# Patient Record
Sex: Female | Born: 1985 | Race: Asian | Hispanic: No | Smoking: Former smoker
Health system: Southern US, Community
[De-identification: ages and names within clinical notes are randomized; demographics above are authoritative.]

## PROBLEM LIST (undated history)

## (undated) DIAGNOSIS — R87629 Unspecified abnormal cytological findings in specimens from vagina: Secondary | ICD-10-CM

## (undated) DIAGNOSIS — B999 Unspecified infectious disease: Secondary | ICD-10-CM

## (undated) DIAGNOSIS — O039 Complete or unspecified spontaneous abortion without complication: Secondary | ICD-10-CM

## (undated) DIAGNOSIS — D649 Anemia, unspecified: Secondary | ICD-10-CM

## (undated) DIAGNOSIS — Z87442 Personal history of urinary calculi: Secondary | ICD-10-CM

## (undated) HISTORY — PX: NO PAST SURGERIES: SHX2092

## (undated) HISTORY — DX: Anemia, unspecified: D64.9

## (undated) HISTORY — DX: Complete or unspecified spontaneous abortion without complication: O03.9

## (undated) HISTORY — PX: OTHER SURGICAL HISTORY: SHX169

---
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
## 2009-10-10 ENCOUNTER — Inpatient Hospital Stay: Payer: Self-pay | Admitting: Obstetrics & Gynecology

## 2011-08-05 ENCOUNTER — Emergency Department: Payer: Self-pay | Admitting: *Deleted

## 2011-08-08 ENCOUNTER — Emergency Department: Payer: Self-pay | Admitting: Emergency Medicine

## 2011-09-06 ENCOUNTER — Emergency Department: Payer: Self-pay | Admitting: Emergency Medicine

## 2012-03-05 ENCOUNTER — Emergency Department: Payer: Self-pay | Admitting: Emergency Medicine

## 2012-05-06 ENCOUNTER — Emergency Department: Payer: Self-pay | Admitting: Emergency Medicine

## 2012-05-06 LAB — URINALYSIS, COMPLETE
Bilirubin,UR: NEGATIVE
Ketone: NEGATIVE
Protein: 100
Specific Gravity: 1.018 (ref 1.003–1.030)
WBC UR: 31 /HPF (ref 0–5)

## 2012-05-06 LAB — CBC
MCHC: 32.6 g/dL (ref 32.0–36.0)
MCV: 65 fL — ABNORMAL LOW (ref 80–100)
RDW: 16.3 % — ABNORMAL HIGH (ref 11.5–14.5)
WBC: 12.9 10*3/uL — ABNORMAL HIGH (ref 3.6–11.0)

## 2012-05-06 LAB — HCG, QUANTITATIVE, PREGNANCY: Beta Hcg, Quant.: 252 m[IU]/mL — ABNORMAL HIGH

## 2012-05-08 ENCOUNTER — Other Ambulatory Visit: Payer: Self-pay | Admitting: Emergency Medicine

## 2012-05-08 LAB — HCG, QUANTITATIVE, PREGNANCY: Beta Hcg, Quant.: 26 m[IU]/mL — ABNORMAL HIGH

## 2012-11-30 ENCOUNTER — Encounter: Payer: Self-pay | Admitting: Obstetrics and Gynecology

## 2013-03-29 ENCOUNTER — Inpatient Hospital Stay: Payer: Self-pay

## 2013-03-29 LAB — CBC WITH DIFFERENTIAL/PLATELET
Basophil #: 0 10*3/uL (ref 0.0–0.1)
Basophil %: 0.2 %
EOS ABS: 0.2 10*3/uL (ref 0.0–0.7)
Eosinophil %: 1.7 %
HCT: 30.9 % — AB (ref 35.0–47.0)
HGB: 10.5 g/dL — ABNORMAL LOW (ref 12.0–16.0)
LYMPHS ABS: 2.1 10*3/uL (ref 1.0–3.6)
LYMPHS PCT: 19.2 %
MCH: 22.7 pg — ABNORMAL LOW (ref 26.0–34.0)
MCHC: 34 g/dL (ref 32.0–36.0)
MCV: 67 fL — AB (ref 80–100)
MONO ABS: 0.7 x10 3/mm (ref 0.2–0.9)
Monocyte %: 6.1 %
Neutrophil #: 8 10*3/uL — ABNORMAL HIGH (ref 1.4–6.5)
Neutrophil %: 72.8 %
Platelet: 158 10*3/uL (ref 150–440)
RBC: 4.62 10*6/uL (ref 3.80–5.20)
RDW: 15.5 % — ABNORMAL HIGH (ref 11.5–14.5)
WBC: 11 10*3/uL (ref 3.6–11.0)

## 2013-03-29 LAB — GC/CHLAMYDIA PROBE AMP

## 2013-03-30 LAB — HEMATOCRIT: HCT: 28 % — ABNORMAL LOW

## 2014-07-09 NOTE — H&P (Signed)
L&D Evaluation:  History:  HPI Pt is a 29 yo G4P2012 at 40.[redacted] weeks GA who presents to L&D with reports of SROM at 2:59am. She reports clear fluid, +FM, and is also having contractions. Her prenatal care is significant for late entry to care, +GC and CT during this pregnancy with a negative GC/CT on 12/29, amenia, smoker, and echogenic foci seen with a negative harmony test. She is B+, RI, VI, GBS negative, and recieved her TDaP this pregnancy.   Presents with contractions, leaking fluid   Patient's Medical History anemia   Patient's Surgical History none   Medications Pre Natal Vitamins   Allergies NKDA   Social History tobacco   Family History Non-Contributory   ROS:  ROS All systems were reviewed.  HEENT, CNS, GI, GU, Respiratory, CV, Renal and Musculoskeletal systems were found to be normal.   Exam:  Vital Signs stable   General no apparent distress   Mental Status clear   Chest clear   Heart normal sinus rhythm   Abdomen gravid, tender with contractions   Pelvic no external lesions, by RN at 5:35- 4/80/-1   Mebranes Ruptured   Description clear   FHT normal rate with no decels, 140's +accels   Ucx regular, 2-4 minutes   Skin dry, no lesions, no rashes   Lymph no lymphadenopathy   Impression:  Impression IUP at 40.4, SROM, labor   Plan:  Plan EFM/NST, monitor contractions and for cervical change, epidural as desired, anticipate SVD   Follow Up Appointment need to schedule   Electronic Signatures: Jannet MantisSubudhi, Kewana Sanon (CNM)  (Signed 29-Jan-15 07:21)  Authored: L&D Evaluation   Last Updated: 29-Jan-15 07:21 by Jannet MantisSubudhi, Damiel Barthold (CNM)

## 2015-05-27 ENCOUNTER — Emergency Department
Admission: EM | Admit: 2015-05-27 | Discharge: 2015-05-27 | Disposition: A | Discharge status: Home / Self Care | Payer: Self-pay | Attending: Student | Admitting: Student

## 2015-05-27 ENCOUNTER — Encounter: Payer: Self-pay | Admitting: Emergency Medicine

## 2015-05-27 ENCOUNTER — Emergency Department: Payer: Self-pay

## 2015-05-27 DIAGNOSIS — R112 Nausea with vomiting, unspecified: Secondary | ICD-10-CM | POA: Insufficient documentation

## 2015-05-27 DIAGNOSIS — Z3202 Encounter for pregnancy test, result negative: Secondary | ICD-10-CM | POA: Insufficient documentation

## 2015-05-27 DIAGNOSIS — N390 Urinary tract infection, site not specified: Secondary | ICD-10-CM | POA: Insufficient documentation

## 2015-05-27 DIAGNOSIS — R197 Diarrhea, unspecified: Secondary | ICD-10-CM | POA: Insufficient documentation

## 2015-05-27 DIAGNOSIS — R109 Unspecified abdominal pain: Secondary | ICD-10-CM

## 2015-05-27 LAB — COMPREHENSIVE METABOLIC PANEL
ALBUMIN: 3.6 g/dL (ref 3.5–5.0)
ALT: 20 U/L (ref 14–54)
AST: 20 U/L (ref 15–41)
Alkaline Phosphatase: 52 U/L (ref 38–126)
Anion gap: 5 (ref 5–15)
BUN: 10 mg/dL (ref 6–20)
CHLORIDE: 109 mmol/L (ref 101–111)
CO2: 23 mmol/L (ref 22–32)
Calcium: 8.4 mg/dL — ABNORMAL LOW (ref 8.9–10.3)
Creatinine, Ser: 0.51 mg/dL (ref 0.44–1.00)
GFR calc Af Amer: >60 mL/min (ref >60)
GFR calc non Af Amer: >60 mL/min (ref >60)
GLUCOSE: 97 mg/dL (ref 65–99)
POTASSIUM: 3.6 mmol/L (ref 3.5–5.1)
SODIUM: 137 mmol/L (ref 135–145)
TOTAL PROTEIN: 7.4 g/dL (ref 6.5–8.1)
Total Bilirubin: 0.3 mg/dL (ref 0.3–1.2)

## 2015-05-27 LAB — URINALYSIS COMPLETE WITH MICROSCOPIC (ARMC ONLY)
Bilirubin Urine: NEGATIVE
Glucose, UA: NEGATIVE mg/dL
Ketones, ur: NEGATIVE mg/dL
NITRITE: NEGATIVE
PROTEIN: 30 mg/dL — AB
SPECIFIC GRAVITY, URINE: 1.03 (ref 1.005–1.030)
pH: 5 (ref 5.0–8.0)

## 2015-05-27 LAB — LIPASE, BLOOD: LIPASE: 13 U/L (ref 11–51)

## 2015-05-27 LAB — CBC
HEMATOCRIT: 32.4 % — AB (ref 35.0–47.0)
HEMOGLOBIN: 10.7 g/dL — AB (ref 12.0–16.0)
MCH: 19.9 pg — ABNORMAL LOW (ref 26.0–34.0)
MCHC: 33 g/dL (ref 32.0–36.0)
MCV: 60.4 fL — AB (ref 80.0–100.0)
Platelets: 225 10*3/uL (ref 150–440)
RBC: 5.36 MIL/uL — ABNORMAL HIGH (ref 3.80–5.20)
RDW: 16 % — ABNORMAL HIGH (ref 11.5–14.5)
WBC: 11.2 10*3/uL — ABNORMAL HIGH (ref 3.6–11.0)

## 2015-05-27 LAB — POCT PREGNANCY, URINE: PREG TEST UR: NEGATIVE

## 2015-05-27 MED ORDER — FENTANYL CITRATE (PF) 100 MCG/2ML IJ SOLN
50.0000 ug | Freq: Once | INTRAMUSCULAR | Status: AC
  Administered 2015-05-27: 50 ug via INTRAVENOUS
  Filled 2015-05-27: qty 2

## 2015-05-27 MED ORDER — NITROFURANTOIN MONOHYD MACRO 100 MG PO CAPS: 100.0000 mg | ORAL_CAPSULE | Freq: Two times a day (BID) | ORAL | Status: DC

## 2015-05-27 MED ORDER — IBUPROFEN 400 MG PO TABS: 400.0000 mg | ORAL_TABLET | Freq: Four times a day (QID) | ORAL | Status: DC | PRN

## 2015-05-27 MED ORDER — DIATRIZOATE MEGLUMINE & SODIUM 66-10 % PO SOLN
15.0000 mL | Freq: Once | ORAL | Status: AC
  Administered 2015-05-27: 15 mL via ORAL

## 2015-05-27 MED ORDER — IOPAMIDOL (ISOVUE-300) INJECTION 61%
100.0000 mL | Freq: Once | INTRAVENOUS | Status: AC | PRN
  Administered 2015-05-27: 100 mL via INTRAVENOUS

## 2015-05-27 MED ORDER — NITROFURANTOIN MONOHYD MACRO 100 MG PO CAPS
100.0000 mg | ORAL_CAPSULE | Freq: Once | ORAL | Status: AC
  Administered 2015-05-27: 100 mg via ORAL
  Filled 2015-05-27: qty 1

## 2015-05-27 MED ORDER — ONDANSETRON HCL 4 MG/2ML IJ SOLN
4.0000 mg | Freq: Once | INTRAMUSCULAR | Status: AC
  Administered 2015-05-27: 4 mg via INTRAVENOUS
  Filled 2015-05-27: qty 2

## 2015-05-27 MED ORDER — ONDANSETRON 4 MG PO TBDP: 4.0000 mg | ORAL_TABLET | Freq: Three times a day (TID) | ORAL | Status: DC | PRN

## 2015-05-27 MED ORDER — SODIUM CHLORIDE 0.9 % IV BOLUS (SEPSIS)
1000.0000 mL | Freq: Once | INTRAVENOUS | Status: AC
  Administered 2015-05-27: 1000 mL via INTRAVENOUS

## 2015-05-27 MED ORDER — MORPHINE SULFATE (PF) 4 MG/ML IV SOLN
4.0000 mg | Freq: Once | INTRAVENOUS | Status: DC
  Filled 2015-05-27: qty 1

## 2015-05-27 NOTE — ED Provider Notes (Addendum)
[facility] Emergency Department Provider Note  ____________________________________________  Time seen: Approximately 12:25 PM  I have reviewed the triage vital signs and the nursing notes.   HISTORY  Chief Complaint Abdominal Pain    HPI Breanna Webb is a 30 y.o. female with no chronic medical problems who presents for evaluation of 3 days intermittent epigastric pain, cramping in nature, currently moderate, no modifying factors, usually gradual onset. The patient reports that 3 days ago she developed epigastric cramping pain associated with nonbloody nonbilious emesis. She felt better 2 days ago and yesterday however today she has recurrence of that epigastric pain as well as 3 episodes of nonbloody diarrhea. She denies fevers but she has had chills. She has had dysuria. No abnormal vaginal bleeding or vaginal discharge.   History reviewed. No pertinent past medical history.  There are no active problems to display for this patient.   History reviewed. No pertinent past surgical history.  Current Outpatient Rx  Name  Route  Sig  Dispense  Refill  . ibuprofen (ADVIL,MOTRIN) 400 MG tablet   Oral   Take 1 tablet (400 mg total) by mouth every 6 (six) hours as needed for moderate pain.   15 tablet   0   . nitrofurantoin, macrocrystal-monohydrate, (MACROBID) 100 MG capsule   Oral   Take 1 capsule (100 mg total) by mouth 2 (two) times daily.   14 capsule   0   . ondansetron (ZOFRAN ODT) 4 MG disintegrating tablet   Oral   Take 1 tablet (4 mg total) by mouth every 8 (eight) hours as needed for nausea or vomiting.   12 tablet   0     Allergies Review of patient's allergies indicates no known allergies.  No family history on file.  Social History Social History  Substance Use Topics  . Smoking status: Never Smoker   . Smokeless tobacco: None  . Alcohol Use: None    Review of Systems Constitutional: No fever/chills Eyes: No visual  changes. ENT: No sore throat. Cardiovascular: Denies chest pain. Respiratory: Denies shortness of breath. Gastrointestinal: + abdominal pain.  + nausea, + vomiting.  + diarrhea.  No constipation. Genitourinary: Negative for dysuria. Musculoskeletal: Negative for back pain. Skin: Negative for rash. Neurological: Negative for headaches, focal weakness or numbness.  10-point ROS otherwise negative.  ____________________________________________   PHYSICAL EXAM:  VITAL SIGNS: ED Triage Vitals  Enc Vitals Group     BP 05/27/15 1134 137/76 mmHg     Pulse Rate 05/27/15 1134 85     Resp 05/27/15 1134 20     Temp 05/27/15 1134 98.4 F (36.9 C)     Temp Source 05/27/15 1134 Oral     SpO2 05/27/15 1134 96 %     Weight 05/27/15 1134 166 lb (75.297 kg)     Height 05/27/15 1134 5' (1.524 m)     Head Cir --      Peak Flow --      Pain Score --      Pain Loc --      Pain Edu? --      Excl. in GC? --     Constitutional: Alert and oriented. Well appearing and in no acute distress. Eyes: Conjunctivae are normal. PERRL. EOMI. Head: Atraumatic. Nose: No congestion/rhinnorhea. Mouth/Throat: Mucous membranes are moist.  Oropharynx non-erythematous. Neck: No stridor.  Supple without meningismus. Cardiovascular: Normal rate, regular rhythm. Grossly normal heart sounds.  Good peripheral circulation. Respiratory: Normal respiratory effort.  No retractions.  Lungs CTAB. Gastrointestinal: Soft with tenderness to palpation in the right lower quadrant. No rebound or guarding. No CVA tenderness. Genitourinary: Deferred Musculoskeletal: No lower extremity tenderness nor edema.  No joint effusions. Neurologic:  Normal speech and language. No gross focal neurologic deficits are appreciated. No gait instability. Skin:  Skin is warm, dry and intact. No rash noted. Psychiatric: Mood and affect are normal. Speech and behavior are normal.  ____________________________________________   LABS (all labs  ordered are listed, but only abnormal results are displayed)  Labs Reviewed  COMPREHENSIVE METABOLIC PANEL - Abnormal; Notable for the following:    Calcium 8.4 (*)    All other components within normal limits  CBC - Abnormal; Notable for the following:    WBC 11.2 (*)    RBC 5.36 (*)    Hemoglobin 10.7 (*)    HCT 32.4 (*)    MCV 60.4 (*)    MCH 19.9 (*)    RDW 16.0 (*)    All other components within normal limits  URINALYSIS COMPLETEWITH MICROSCOPIC ([facility] ONLY) - Abnormal; Notable for the following:    Color, Urine YELLOW (*)    APPearance CLOUDY (*)    Hgb urine dipstick 1+ (*)    Protein, ur 30 (*)    Leukocytes, UA 2+ (*)    Bacteria, UA RARE (*)    Squamous Epithelial / LPF 6-30 (*)    All other components within normal limits  URINE CULTURE  LIPASE, BLOOD  POCT PREGNANCY, URINE   ____________________________________________  EKG  none ____________________________________________  RADIOLOGY  CT abdomen and pelvis  FINDINGS: Lower chest: The lung bases are clear of acute process. No pleural effusion or pulmonary lesions. The heart is normal in size. No pericardial effusion. The distal esophagus and aorta are unremarkable.  Hepatobiliary: No focal hepatic lesions or intrahepatic biliary dilatation. The gallbladder is normal. No common bile duct dilatation.  Pancreas: No mass, inflammation or ductal dilatation.  Spleen: Normal size. No focal lesions.  Adrenals/Urinary Tract: The adrenal glands and kidneys are normal.  Stomach/Bowel: The stomach, duodenum, small bowel and colon are unremarkable. No inflammatory changes, mass lesions or obstructive findings. Scattered fluid-filled small bowel loops but no air-fluid levels or inflammatory changes. The terminal ileum is normal. The appendix is normal.  Vascular/Lymphatic: No mesenteric or retroperitoneal mass or adenopathy. The aorta and branch vessels are normal. The major venous structures are  patent.  Reproductive: The uterus and ovaries are unremarkable. There is a small simple appearing cyst associated with the right ovary.  Other: No pelvic mass or adenopathy. No free pelvic fluid collections. No inguinal mass or adenopathy. No abdominal wall hernia or subcutaneous lesions.  Musculoskeletal: No significant bony findings.  IMPRESSION: No acute abdominal/pelvic findings, mass lesions or lymphadenopathy.  ____________________________________________   PROCEDURES  Procedure(s) performed: None  Critical Care performed: No  ____________________________________________   INITIAL IMPRESSION / ASSESSMENT AND PLAN / ED COURSE  Pertinent labs & imaging results that were available during my care of the patient were reviewed by me and considered in my medical decision making (see chart for details).  Isabelly Kobler is a 30 y.o. female with no chronic medical problems who presents for evaluation of 3 days intermittent epigastric pain as well as nausea vomiting and diarrhea. On exam, she is very well-appearing in no acute distress. Vital signs are stable, she is afebrile. She is complaining of pain in the epigastrium however on my assessment only has tenderness in the right lower quadrant. She is otherwise  very well-appearing and I do not think this represents acute ovarian torsion as I would expect her to appear to be in significant pain if that were her primary disease process. Her labs are notable for mild leukocytosis, anemia which is chronic/stable and unchanged from prior, normal CMP, lipase, negative pregnancy test. Urinalysis with 2+ leukocytes, numerous white blood cells. This could represent urinary tract infection, possibly ascending urinary tract infection or could be reactive in the setting of acute appendicitis. Given her tenderness in the right lower quadrant and her leukocytosis with intermittent additional GI complaints, we'll obtain CT of the abdomen and pelvis to  rule out appendicitis. We'll give IV fluids, treat her pain and reassess for disposition.  ----------------------------------------- 2:30 PM on 05/27/2015 ----------------------------------------- CT scan shows no acute findings, normal appendix, simple ovarian cyst in the right ovary is noted however this could be physiologic. Patient with significant improvement of her pain at this time and she is tolerating by mouth. We discussed return precautions, need for close PCP follow-up and she is comfortable with the discharge plan. We'll DC with Macrobid. I also discussed that I'll give her follow-up information for OB/GYN for further evaluation of her cyst though it is likely physiologic and will probably resolve on it's own.  ____________________________________________   FINAL CLINICAL IMPRESSION(S) / ED DIAGNOSES  Final diagnoses:  Abdominal pain, unspecified abdominal location  UTI (lower urinary tract infection)      Gayla DossEryka A Sheyanne Munley, MD 05/27/15 1433  Gayla DossEryka A Dontel Harshberger, MD 05/27/15 1438

## 2015-05-27 NOTE — ED Notes (Signed)
Patient returned from CT. Will await results.

## 2015-05-27 NOTE — ED Notes (Signed)
CT notified that patient has completed contrast. Patient medicated for pain and nausea and given warm blanket. Will continue to monitor. She is resting comfortably at this time.

## 2015-05-27 NOTE — ED Notes (Signed)
Pt with epigastric pain since Saturday with n/v/d only on Saturday. Some diarrhea today.

## 2015-05-29 LAB — URINE CULTURE

## 2016-12-28 ENCOUNTER — Emergency Department (HOSPITAL_COMMUNITY)
Admission: EM | Admit: 2016-12-28 | Discharge: 2016-12-28 | Disposition: A | Discharge status: Home / Self Care | Payer: Self-pay | Attending: Emergency Medicine | Admitting: Emergency Medicine

## 2016-12-28 ENCOUNTER — Encounter (HOSPITAL_COMMUNITY): Payer: Self-pay | Admitting: *Deleted

## 2016-12-28 ENCOUNTER — Emergency Department (HOSPITAL_COMMUNITY): Payer: Self-pay

## 2016-12-28 DIAGNOSIS — N12 Tubulo-interstitial nephritis, not specified as acute or chronic: Secondary | ICD-10-CM

## 2016-12-28 DIAGNOSIS — Z79899 Other long term (current) drug therapy: Secondary | ICD-10-CM | POA: Insufficient documentation

## 2016-12-28 DIAGNOSIS — N3000 Acute cystitis without hematuria: Secondary | ICD-10-CM

## 2016-12-28 LAB — COMPREHENSIVE METABOLIC PANEL
ALT: 64 U/L — AB (ref 14–54)
AST: 55 U/L — ABNORMAL HIGH (ref 15–41)
Albumin: 3.5 g/dL (ref 3.5–5.0)
Alkaline Phosphatase: 111 U/L (ref 38–126)
Anion gap: 10 (ref 5–15)
BUN: <5 mg/dL — ABNORMAL LOW (ref 6–20)
CHLORIDE: 102 mmol/L (ref 101–111)
CO2: 25 mmol/L (ref 22–32)
CREATININE: 0.79 mg/dL (ref 0.44–1.00)
Calcium: 9 mg/dL (ref 8.9–10.3)
GFR calc non Af Amer: >60 mL/min (ref >60)
Glucose, Bld: 117 mg/dL — ABNORMAL HIGH (ref 65–99)
POTASSIUM: 3.5 mmol/L (ref 3.5–5.1)
SODIUM: 137 mmol/L (ref 135–145)
Total Bilirubin: 1.7 mg/dL — ABNORMAL HIGH (ref 0.3–1.2)
Total Protein: 8.5 g/dL — ABNORMAL HIGH (ref 6.5–8.1)

## 2016-12-28 LAB — URINALYSIS, ROUTINE W REFLEX MICROSCOPIC
BILIRUBIN URINE: NEGATIVE
Glucose, UA: NEGATIVE mg/dL
Ketones, ur: NEGATIVE mg/dL
Nitrite: POSITIVE — AB
Protein, ur: 30 mg/dL — AB
SPECIFIC GRAVITY, URINE: 1.01 (ref 1.005–1.030)
pH: 7 (ref 5.0–8.0)

## 2016-12-28 LAB — CBC WITH DIFFERENTIAL/PLATELET
BASOS PCT: 0 %
Basophils Absolute: 0 10*3/uL (ref 0.0–0.1)
EOS PCT: 0 %
Eosinophils Absolute: 0 10*3/uL (ref 0.0–0.7)
HEMATOCRIT: 33.6 % — AB (ref 36.0–46.0)
HEMOGLOBIN: 11.2 g/dL — AB (ref 12.0–15.0)
Lymphocytes Relative: 18 %
Lymphs Abs: 2.5 10*3/uL (ref 0.7–4.0)
MCH: 20 pg — AB (ref 26.0–34.0)
MCHC: 33.3 g/dL (ref 30.0–36.0)
MCV: 59.9 fL — AB (ref 78.0–100.0)
MONO ABS: 1.4 10*3/uL — AB (ref 0.1–1.0)
MONOS PCT: 10 %
NEUTROS PCT: 72 %
Neutro Abs: 9.8 10*3/uL — ABNORMAL HIGH (ref 1.7–7.7)
PLATELETS: 182 10*3/uL (ref 150–400)
RBC: 5.61 MIL/uL — ABNORMAL HIGH (ref 3.87–5.11)
RDW: 16.4 % — ABNORMAL HIGH (ref 11.5–15.5)
WBC: 13.7 10*3/uL — ABNORMAL HIGH (ref 4.0–10.5)

## 2016-12-28 LAB — I-STAT CG4 LACTIC ACID, ED
LACTIC ACID, VENOUS: 1.4 mmol/L (ref 0.5–1.9)
Lactic Acid, Venous: 1.98 mmol/L — ABNORMAL HIGH (ref 0.5–1.9)

## 2016-12-28 MED ORDER — ACETAMINOPHEN 325 MG PO TABS
ORAL_TABLET | ORAL | Status: AC
  Filled 2016-12-28: qty 2

## 2016-12-28 MED ORDER — CIPROFLOXACIN IN D5W 400 MG/200ML IV SOLN
400.0000 mg | Freq: Once | INTRAVENOUS | Status: AC
  Administered 2016-12-28: 400 mg via INTRAVENOUS
  Filled 2016-12-28: qty 200

## 2016-12-28 MED ORDER — SODIUM CHLORIDE 0.9 % IV BOLUS (SEPSIS)
1000.0000 mL | Freq: Once | INTRAVENOUS | Status: AC
  Administered 2016-12-28: 1000 mL via INTRAVENOUS

## 2016-12-28 MED ORDER — PROMETHAZINE HCL 25 MG PO TABS: 25.0000 mg | ORAL_TABLET | Freq: Three times a day (TID) | ORAL | 0 refills | Status: DC | PRN

## 2016-12-28 MED ORDER — ACETAMINOPHEN 325 MG PO TABS
650.0000 mg | ORAL_TABLET | Freq: Once | ORAL | Status: AC
  Administered 2016-12-28: 650 mg via ORAL

## 2016-12-28 MED ORDER — CIPROFLOXACIN HCL 500 MG PO TABS: 500.0000 mg | ORAL_TABLET | Freq: Two times a day (BID) | ORAL | 0 refills | Status: DC

## 2016-12-28 MED ORDER — IBUPROFEN 800 MG PO TABS: 800.0000 mg | ORAL_TABLET | Freq: Three times a day (TID) | ORAL | 0 refills | Status: DC | PRN

## 2016-12-28 NOTE — ED Triage Notes (Signed)
Pt c/o dysuria onset yesterday with suprapubic cramping, pt febrile in triage, denies v/d, c/o nausea, A&O x4

## 2016-12-28 NOTE — Discharge Instructions (Signed)
Return here as needed.  Follow-up with a primary doctor.  Increase your fluid intake.  Rest as much as possible. °

## 2016-12-28 NOTE — ED Notes (Signed)
Pt ambulatory to restroom without difficulty. Reports feeling better.

## 2016-12-28 NOTE — ED Provider Notes (Signed)
[facility]   CSN: [id number] Arrival date & time: 12/28/16  0844     History   Chief Complaint Chief Complaint  Patient presents with  . Dysuria    HPI Breanna Webb is a 31 y.o. female.  HPI Patient presents to the emergency department with back pain and dysuria since yesterday the patient states she had urinary tract infections in the past.  The patient states that he also had fever as well.  Patient states she did not take any medications prior to arrival.  She states that nothing seems to make the condition better or worse.  She states she has had several episodes of vomiting.  The patient denies chest pain, shortness of breath, headache,blurred vision, neck pain, fever, cough, weakness, numbness, dizziness, anorexia, edema,diarrhea, dysuria, hematemesis, bloody stool, near syncope, or syncope. History reviewed. No pertinent past medical history.  There are no active problems to display for this patient.   History reviewed. No pertinent surgical history.  OB History    No data available       Home Medications    Prior to Admission medications   Medication Sig Start Date End Date Taking? Authorizing Provider  ibuprofen (ADVIL,MOTRIN) 400 MG tablet Take 1 tablet (400 mg total) by mouth every 6 (six) hours as needed for moderate pain. 05/27/15   Gayla DossGayle, Eryka A, MD  nitrofurantoin, macrocrystal-monohydrate, (MACROBID) 100 MG capsule Take 1 capsule (100 mg total) by mouth 2 (two) times daily. 05/27/15   Gayla DossGayle, Eryka A, MD  ondansetron (ZOFRAN ODT) 4 MG disintegrating tablet Take 1 tablet (4 mg total) by mouth every 8 (eight) hours as needed for nausea or vomiting. 05/27/15   Gayla DossGayle, Eryka A, MD    Family History No family history on file.  Social History Social History  Substance Use Topics  . Smoking status: Never Smoker  . Smokeless tobacco: Never Used  . Alcohol use Yes     Comment: occassional      Allergies   Patient has no known allergies.   Review of Systems Review of Systems All other systems negative except as documented in the HPI. All pertinent positives and negatives as reviewed in the HPI.  Physical Exam Updated Vital Signs BP 119/80 (BP Location: Right Arm)   Pulse 74   Temp 99.4 F (37.4 C) (Oral)   Resp 16   Ht 5\' 2"  (1.575 m)   Wt 77.2 kg (170 lb 4 oz)   LMP 12/16/2016 (Exact Date)   SpO2 100%   BMI 31.14 kg/m   Physical Exam  Constitutional: She is oriented to person, place, and time. She appears well-developed and well-nourished. No distress.  HENT:  Head: Normocephalic and atraumatic.  Mouth/Throat: Oropharynx is clear and moist.  Eyes: Pupils are equal, round, and reactive to light.  Neck: Normal range of motion. Neck supple.  Cardiovascular: Normal rate, regular rhythm and normal heart sounds.  Exam reveals no gallop and no friction rub.   No murmur heard. Pulmonary/Chest: Effort normal and breath sounds normal. No respiratory distress. She has no wheezes.  Abdominal: Soft. Bowel sounds are normal. She exhibits no distension. There is no tenderness.  Neurological: She is alert and oriented to person, place, and time. She exhibits normal muscle tone. Coordination normal.  Skin: Skin is warm and dry. Capillary refill takes less than 2 seconds. No rash noted. No erythema.  Psychiatric: She has a normal mood and affect. Her behavior is normal.  Nursing note  and vitals reviewed.    ED Treatments / Results  Labs (all labs ordered are listed, but only abnormal results are displayed) Labs Reviewed  URINALYSIS, ROUTINE W REFLEX MICROSCOPIC - Abnormal; Notable for the following:       Result Value   Color, Urine AMBER (*)    APPearance CLOUDY (*)    Hgb urine dipstick SMALL (*)    Protein, ur 30 (*)    Nitrite POSITIVE (*)    Leukocytes, UA LARGE (*)    Bacteria, UA MANY (*)    Squamous Epithelial / LPF 0-5 (*)    All other components within  normal limits  COMPREHENSIVE METABOLIC PANEL - Abnormal; Notable for the following:    Glucose, Bld 117 (*)    BUN <5 (*)    Total Protein 8.5 (*)    AST 55 (*)    ALT 64 (*)    Total Bilirubin 1.7 (*)    All other components within normal limits  CBC WITH DIFFERENTIAL/PLATELET - Abnormal; Notable for the following:    WBC 13.7 (*)    RBC 5.61 (*)    Hemoglobin 11.2 (*)    HCT 33.6 (*)    MCV 59.9 (*)    MCH 20.0 (*)    RDW 16.4 (*)    Neutro Abs 9.8 (*)    Monocytes Absolute 1.4 (*)    All other components within normal limits  I-STAT CG4 LACTIC ACID, ED - Abnormal; Notable for the following:    Lactic Acid, Venous 1.98 (*)    All other components within normal limits  URINE CULTURE  I-STAT CG4 LACTIC ACID, ED    EKG  EKG Interpretation None       Radiology Dg Chest 2 View  Result Date: 12/28/2016 CLINICAL DATA:  31 year old female with a history of weakness EXAM: CHEST  2 VIEW COMPARISON:  None. FINDINGS: The heart size and mediastinal contours are within normal limits. Both lungs are clear. The visualized skeletal structures are unremarkable. IMPRESSION: No radiographic evidence of acute cardiopulmonary disease Electronically Signed   By: Gilmer Mor D.O.   On: 12/28/2016 10:04    Procedures Procedures (including critical care time)  Medications Ordered in ED Medications  acetaminophen (TYLENOL) 325 MG tablet (not administered)  acetaminophen (TYLENOL) tablet 650 mg (650 mg Oral Given 12/28/16 0941)  sodium chloride 0.9 % bolus 1,000 mL (0 mLs Intravenous Stopped 12/28/16 1336)  ciprofloxacin (CIPRO) IVPB 400 mg (400 mg Intravenous New Bag/Given 12/28/16 1246)     Initial Impression / Assessment and Plan / ED Course  I have reviewed the triage vital signs and the nursing notes.  Pertinent labs & imaging results that were available during my care of the patient were reviewed by me and considered in my medical decision making (see chart for details).      Patient is dramatically improved following IV fluids and IV antibiotics the patient be sent home on ciprofloxacin told to return here as needed patient's vital signs have also improved to do feel that she has early [place] and told her to return here for any worsening in her condition Tylenol and Motrin for any pain patient agrees to the plan and all questions were answered did ask her to follow-up with a primary doctor  Final Clinical Impressions(s) / ED Diagnoses   Final diagnoses:  None    New Prescriptions New Prescriptions   No medications on file     Charlestine Night, PA-C 12/29/16 1625  Charlynne Pander, MD 12/29/16 [phone]

## 2016-12-30 LAB — URINE CULTURE: Culture: >=100000 — AB

## 2016-12-31 ENCOUNTER — Telehealth: Payer: Self-pay | Admitting: *Deleted

## 2016-12-31 NOTE — Telephone Encounter (Signed)
Post ED Visit - Positive Culture Follow-up  Culture report reviewed by antimicrobial stewardship pharmacist:  [x]  Enzo BiNathan Batchelder, Pharm.D. []  Celedonio MiyamotoJeremy Frens, Pharm.D., BCPS AQ-ID []  Garvin FilaMike Maccia, Pharm.D., BCPS []  Georgina PillionElizabeth Martin, Pharm.D., BCPS []  BourgMinh Pham, [address] BoulevardPharm.D., BCPS, AAHIVP []  Estella HuskMichelle Turner, Pharm.D., BCPS, AAHIVP []  Lysle Pearlachel Rumbarger, PharmD, BCPS []  Casilda Carlsaylor Stone, PharmD, BCPS []  Pollyann SamplesAndy Johnston, PharmD, BCPS  Positive urine culture Treated with Ciprofloxacin HCL, organism sensitive to the same and no further patient follow-up is required at this time.  Virl AxeRobertson, Brittnee Gaetano [facility]ley 12/31/2016, 10:05 AM

## 2017-04-18 ENCOUNTER — Encounter: Payer: Self-pay | Admitting: Emergency Medicine

## 2017-04-18 ENCOUNTER — Emergency Department
Admission: EM | Admit: 2017-04-18 | Discharge: 2017-04-18 | Disposition: A | Discharge status: Home / Self Care | Payer: Self-pay | Attending: Emergency Medicine | Admitting: Emergency Medicine

## 2017-04-18 ENCOUNTER — Other Ambulatory Visit: Payer: Self-pay

## 2017-04-18 DIAGNOSIS — L239 Allergic contact dermatitis, unspecified cause: Secondary | ICD-10-CM | POA: Insufficient documentation

## 2017-04-18 DIAGNOSIS — Z79899 Other long term (current) drug therapy: Secondary | ICD-10-CM | POA: Insufficient documentation

## 2017-04-18 MED ORDER — DIPHENHYDRAMINE HCL 25 MG PO CAPS: 25.0000 mg | ORAL_CAPSULE | Freq: Four times a day (QID) | ORAL | 0 refills | Status: DC | PRN

## 2017-04-18 MED ORDER — PREDNISONE 50 MG PO TABS: ORAL_TABLET | ORAL | 0 refills | Status: DC

## 2017-04-18 NOTE — ED Triage Notes (Signed)
Pt states hives intermittently, for which she usually takes benadryl w/ resolution. Pt states she took benadryl last and continues to have hives on her thighs and under her left arm along ribs. Pt c/o itching. Pt in no acute distress at this time. Pt c/o itching at night and in the morning. Pt states no one else in family has similar rash.

## 2017-04-18 NOTE — ED Provider Notes (Signed)
[facility] Emergency Department Provider Note  ____________________________________________  Time seen: Approximately 4:40 PM  I have reviewed the triage vital signs and the nursing notes.   HISTORY  Chief Complaint Urticaria    HPI Breanna Webb is a 32 y.o. female presents to the emergency department with hives of the inner thighs.  Patient reports that she has experienced hives intermittently for the past 2 years.  Patient reports possible sources of contact dermatitis which include her daily laundry soap, her scented soap from [employer] and new [employer] stream[employer]othing.  She denies nonproductive cough, wheezing, nausea, vomiting and diarrhea.  Patient is able to swallow without difficulty and is speaking in complete sentences.  Patient denies a history of anaphylaxis.  Patient has been taking Benadryl.  History reviewed. No pertinent past medical history.  There are no active problems to display for this patient.   History reviewed. No pertinent surgical history.  Prior to Admission medications   Medication Sig Start Date End Date Taking? Authorizing Provider  ciprofloxacin (CIPRO) 500 MG tablet Take 1 tablet (500 mg total) by mouth 2 (two) times daily. 12/28/16   Lawyer, Cristal Deerhristopher, PA-C  diphenhydrAMINE (BENADRYL ALLERGY) 25 mg capsule Take 1 capsule (25 mg total) by mouth every 6 (six) hours as needed for up to 10 days. 04/18/17 04/28/17  Orvil FeilWoods, Myria Steenbergen M, PA-C  ibuprofen (ADVIL,MOTRIN) 800 MG tablet Take 1 tablet (800 mg total) by mouth every 8 (eight) hours as needed. 12/28/16   Lawyer, Cristal Deerhristopher, PA-C  nitrofurantoin, macrocrystal-monohydrate, (MACROBID) 100 MG capsule Take 1 capsule (100 mg total) by mouth 2 (two) times daily. 05/27/15   Gayla DossGayle, Eryka A, MD  ondansetron (ZOFRAN ODT) 4 MG disintegrating tablet Take 1 tablet (4 mg total) by mouth every 8 (eight) hours as needed for nausea or vomiting. 05/27/15   Gayla DossGayle, Eryka A, MD  predniSONE (DELTASONE) 50 MG  tablet Take one 50 mg tablet once a day for 5 days. 04/18/17   Orvil FeilWoods, Chisom Aust M, PA-C  promethazine (PHENERGAN) 25 MG tablet Take 1 tablet (25 mg total) by mouth every 8 (eight) hours as needed for nausea or vomiting. 12/28/16   Charlestine NightLawyer, Christopher, PA-C    Allergies Patient has no known allergies.  History reviewed. No pertinent family history.  Social History Social History   Tobacco Use  . Smoking status: Never Smoker  . Smokeless tobacco: Never Used  Substance Use Topics  . Alcohol use: Yes    Comment: occassional  . Drug use: No     Review of Systems  Constitutional: No fever/chills Eyes: No visual changes. No discharge ENT: No upper respiratory complaints. Cardiovascular: no chest pain. Respiratory: no cough. No SOB. Gastrointestinal: No abdominal pain.  No nausea, no vomiting.  No diarrhea.  No constipation. Musculoskeletal: Negative for musculoskeletal pain. Skin: Patient hives.  Neurological: Negative for headaches, focal weakness or numbness.  ____________________________________________   PHYSICAL EXAM:  VITAL SIGNS: ED Triage Vitals  Enc Vitals Group     BP 04/18/17 1500 118/77     Pulse Rate 04/18/17 1500 88     Resp 04/18/17 1500 20     Temp 04/18/17 1500 98.5 F (36.9 C)     Temp Source 04/18/17 1500 Oral     SpO2 04/18/17 1500 99 %     Weight 04/18/17 1505 168 lb (76.2 kg)     Height 04/18/17 1505 5\' 1"  (1.549 m)     Head Circumference --      Peak Flow --  Pain Score --      Pain Loc --      Pain Edu? --      Excl. in GC? --      Constitutional: Alert and oriented. Well appearing and in no acute distress. Eyes: Conjunctivae are normal. PERRL. EOMI. Head: Atraumatic. ENT:      Ears: TMs are pearly.       Nose: No congestion/rhinnorhea.      Mouth/Throat: Mucous membranes are moist.  Neck: No stridor.  No cervical spine tenderness to palpation. Cardiovascular: Normal rate, regular rhythm. Normal S1 and S2.  Good peripheral  circulation. Respiratory: Normal respiratory effort without tachypnea or retractions. Lungs CTAB. Good air entry to the bases with no decreased or absent breath sounds. Gastrointestinal: Bowel sounds 4 quadrants. Soft and nontender to palpation. No guarding or rigidity. No palpable masses. No distention. No CVA tenderness. Musculoskeletal: Full range of motion to all extremities. No gross deformities appreciated. Neurologic:  Normal speech and language. No gross focal neurologic deficits are appreciated.  Skin: Diffuse urticaria along the inner thighs.  Urticaria stops at the knee. Psychiatric: Mood and affect are normal. Speech and behavior are normal. Patient exhibits appropriate insight and judgement.   ____________________________________________   LABS (all labs ordered are listed, but only abnormal results are displayed)  Labs Reviewed - No data to display ____________________________________________  EKG   ____________________________________________  RADIOLOGY   No results found.  ____________________________________________    PROCEDURES  Procedure(s) performed:    Procedures    Medications - No data to display   ____________________________________________   INITIAL IMPRESSION / ASSESSMENT AND PLAN / ED COURSE  Pertinent labs & imaging results that were available during my care of the patient were reviewed by me and considered in my medical decision making (see chart for details).  Review of the [place] CSRS was performed in accordance of the [facility] prior to dispensing any controlled drugs.     Assessment and plan Contact dermatitis Patient presents to the emergency department with urticaria of the inner thighs patient has experienced intermittently for the past 2 years.  Differential diagnosis included contact dermatitis versus drug allergy versus unspecified rash.  History and physical exam findings are consistent with contact dermatitis at this time.   Patient was discharged with prednisone and Benadryl.  Return precautions were given.  All patient questions were answered.  Vital signs are reassuring prior to discharge.     ____________________________________________  FINAL CLINICAL IMPRESSION(S) / ED DIAGNOSES  Final diagnoses:  Allergic contact dermatitis, unspecified trigger      NEW MEDICATIONS STARTED DURING THIS VISIT:  ED Discharge Orders        Ordered    predniSONE (DELTASONE) 50 MG tablet     04/18/17 1529    diphenhydrAMINE (BENADRYL ALLERGY) 25 mg capsule  Every 6 hours PRN     04/18/17 1532          This chart was dictated using voice recognition software/Dragon. Despite best efforts to proofread, errors can occur which can change the meaning. Any change was purely unintentional.    Orvil Feil, PA-C 04/18/17 1643    Phineas Semen, MD 04/18/17 Paulo Fruit

## 2018-11-09 ENCOUNTER — Encounter: Payer: Self-pay | Admitting: Emergency Medicine

## 2018-11-09 ENCOUNTER — Other Ambulatory Visit: Payer: Self-pay

## 2018-11-09 DIAGNOSIS — Z79899 Other long term (current) drug therapy: Secondary | ICD-10-CM | POA: Insufficient documentation

## 2018-11-09 DIAGNOSIS — K2920 Alcoholic gastritis without bleeding: Secondary | ICD-10-CM | POA: Insufficient documentation

## 2018-11-09 LAB — CBC
HCT: 37.5 % (ref 36.0–46.0)
Hemoglobin: 12.4 g/dL (ref 12.0–15.0)
MCH: 20.6 pg — ABNORMAL LOW (ref 26.0–34.0)
MCHC: 33.1 g/dL (ref 30.0–36.0)
MCV: 62.3 fL — ABNORMAL LOW (ref 80.0–100.0)
Platelets: 252 10*3/uL (ref 150–400)
RBC: 6.02 MIL/uL — ABNORMAL HIGH (ref 3.87–5.11)
RDW: 17.4 % — ABNORMAL HIGH (ref 11.5–15.5)
WBC: 10.9 10*3/uL — ABNORMAL HIGH (ref 4.0–10.5)
nRBC: 0 % (ref 0.0–0.2)

## 2018-11-09 LAB — COMPREHENSIVE METABOLIC PANEL
ALT: 30 U/L (ref 0–44)
AST: 25 U/L (ref 15–41)
Albumin: 4.5 g/dL (ref 3.5–5.0)
Alkaline Phosphatase: 61 U/L (ref 38–126)
Anion gap: 10 (ref 5–15)
BUN: 10 mg/dL (ref 6–20)
CO2: 29 mmol/L (ref 22–32)
Calcium: 9.5 mg/dL (ref 8.9–10.3)
Chloride: 100 mmol/L (ref 98–111)
Creatinine, Ser: 0.55 mg/dL (ref 0.44–1.00)
GFR calc Af Amer: >60 mL/min (ref >60)
GFR calc non Af Amer: >60 mL/min (ref >60)
Glucose, Bld: 118 mg/dL — ABNORMAL HIGH (ref 70–99)
Potassium: 3.1 mmol/L — ABNORMAL LOW (ref 3.5–5.1)
Sodium: 139 mmol/L (ref 135–145)
Total Bilirubin: 1.2 mg/dL (ref 0.3–1.2)
Total Protein: 8.2 g/dL — ABNORMAL HIGH (ref 6.5–8.1)

## 2018-11-09 LAB — LIPASE, BLOOD: Lipase: 17 U/L (ref 11–51)

## 2018-11-09 LAB — URINALYSIS, COMPLETE (UACMP) WITH MICROSCOPIC
Bilirubin Urine: NEGATIVE
Glucose, UA: NEGATIVE mg/dL
Hgb urine dipstick: NEGATIVE
Ketones, ur: 5 mg/dL — AB
Nitrite: NEGATIVE
Protein, ur: 30 mg/dL — AB
Specific Gravity, Urine: 1.024 (ref 1.005–1.030)
pH: 7 (ref 5.0–8.0)

## 2018-11-09 MED ORDER — SODIUM CHLORIDE 0.9% FLUSH: 3.0000 mL | Freq: Once | INTRAVENOUS | Status: DC

## 2018-11-09 NOTE — ED Triage Notes (Signed)
Pt c/o abd pain, nausea, and vomiting xfew days. Pt states she thinks she has alcohol poising, states she was drinking Monday night and had a hangover that has not went away.

## 2018-11-10 ENCOUNTER — Telehealth: Payer: Self-pay | Admitting: Emergency Medicine

## 2018-11-10 ENCOUNTER — Emergency Department
Admission: EM | Admit: 2018-11-10 | Discharge: 2018-11-10 | Disposition: A | Discharge status: Home / Self Care | Payer: Self-pay | Attending: Emergency Medicine | Admitting: Emergency Medicine

## 2018-11-10 DIAGNOSIS — K2920 Alcoholic gastritis without bleeding: Secondary | ICD-10-CM

## 2018-11-10 LAB — HCG, QUANTITATIVE, PREGNANCY: hCG, Beta Chain, Quant, S: <1 m[IU]/mL (ref <5)

## 2018-11-10 MED ORDER — PANTOPRAZOLE SODIUM 40 MG PO TBEC: 40.0000 mg | DELAYED_RELEASE_TABLET | Freq: Every day | ORAL | 0 refills | Status: DC

## 2018-11-10 MED ORDER — ONDANSETRON HCL 4 MG/2ML IJ SOLN
INTRAMUSCULAR | Status: AC
  Administered 2018-11-10: 4 mg via INTRAVENOUS
  Filled 2018-11-10: qty 2

## 2018-11-10 MED ORDER — ONDANSETRON HCL 4 MG/2ML IJ SOLN
4.0000 mg | Freq: Once | INTRAMUSCULAR | Status: AC
  Administered 2018-11-10: 04:00:00 4 mg via INTRAVENOUS

## 2018-11-10 MED ORDER — SODIUM CHLORIDE 0.9 % IV BOLUS
1000.0000 mL | Freq: Once | INTRAVENOUS | Status: AC
  Administered 2018-11-10: 04:00:00 1000 mL via INTRAVENOUS

## 2018-11-10 MED ORDER — ONDANSETRON 4 MG PO TBDP: 4.0000 mg | ORAL_TABLET | Freq: Three times a day (TID) | ORAL | 0 refills | Status: DC | PRN

## 2018-11-10 NOTE — ED Provider Notes (Signed)
[facility] Emergency Department Provider Note _________   First MD Initiated Contact with Patient 11/10/18 [phone]     (approximate)  I have reviewed the triage vital signs and the nursing notes.   HISTORY  Chief Complaint Emesis    HPI Breanna Webb is a 33 y.o. female presents to the emergency department secondary to epigastric abdominal pain nausea and vomiting which patient states is been occurring since since Monday.  Patient states that she ingested a large amount of alcohol on Monday and has had persistent vomiting since then.  Patient states "it feels like a hangover I have just never had one last this long".  Patient denies any fever no diarrhea or constipation.  Patient denies any urinary symptoms.  Patient denies any chest pain or shortness of breath        History reviewed. No pertinent past medical history.  There are no active problems to display for this patient.   History reviewed. No pertinent surgical history.  Prior to Admission medications   Medication Sig Start Date End Date Taking? Authorizing Provider  ciprofloxacin (CIPRO) 500 MG tablet Take 1 tablet (500 mg total) by mouth 2 (two) times daily. 12/28/16   Lawyer, Cristal Deer, PA-C  diphenhydrAMINE (BENADRYL ALLERGY) 25 mg capsule Take 1 capsule (25 mg total) by mouth every 6 (six) hours as needed for up to 10 days. 04/18/17 04/28/17  Orvil Feil, PA-C  ibuprofen (ADVIL,MOTRIN) 800 MG tablet Take 1 tablet (800 mg total) by mouth every 8 (eight) hours as needed. 12/28/16   Lawyer, Cristal Deer, PA-C  nitrofurantoin, macrocrystal-monohydrate, (MACROBID) 100 MG capsule Take 1 capsule (100 mg total) by mouth 2 (two) times daily. 05/27/15   Gayla Doss, MD  ondansetron (ZOFRAN ODT) 4 MG disintegrating tablet Take 1 tablet (4 mg total) by mouth every 8 (eight) hours as needed for nausea or vomiting. 05/27/15   Gayla Doss, MD  ondansetron (ZOFRAN ODT) 4 MG disintegrating tablet  Take 1 tablet (4 mg total) by mouth every 8 (eight) hours as needed. 11/10/18   Darci Current, MD  pantoprazole (PROTONIX) 40 MG tablet Take 1 tablet (40 mg total) by mouth daily. 11/10/18 12/10/18  Darci Current, MD  predniSONE (DELTASONE) 50 MG tablet Take one 50 mg tablet once a day for 5 days. 04/18/17   Orvil Feil, PA-C  promethazine (PHENERGAN) 25 MG tablet Take 1 tablet (25 mg total) by mouth every 8 (eight) hours as needed for nausea or vomiting. 12/28/16   Charlestine Night, PA-C    Allergies Patient has no known allergies.  No family history on file.  Social History Social History   Tobacco Use  . Smoking status: Never Smoker  . Smokeless tobacco: Never Used  Substance Use Topics  . Alcohol use: Yes    Comment: occassional  . Drug use: No    Review of Systems Constitutional: No fever/chills Eyes: No visual changes. ENT: No sore throat. Cardiovascular: Denies chest pain. Respiratory: Denies shortness of breath. Gastrointestinal: Positive for abdominal pain nausea and vomiting Genitourinary: Negative for dysuria. Musculoskeletal: Negative for neck pain.  Negative for back pain. Integumentary: Negative for rash. Neurological: Negative for headaches, focal weakness or numbness.   ____________________________________________   PHYSICAL EXAM:  VITAL SIGNS: ED Triage Vitals  Enc Vitals Group     BP 11/09/18 2133 116/84     Pulse Rate 11/09/18 2133 (!) 58     Resp 11/09/18 2133 16     Temp 11/09/18  2133 99.7 F (37.6 C)     Temp Source 11/09/18 2133 Oral     SpO2 11/09/18 2133 96 %     Weight --      Height --      Head Circumference --      Peak Flow --      Pain Score 11/09/18 2134 8     Pain Loc --      Pain Edu? --      Excl. in [place]? --     Constitutional: Alert and oriented.  Eyes: Conjunctivae are normal.  Head: Atraumatic. Mouth/Throat: Mucous membranes are moist. Neck: No stridor.  No meningeal signs.   Cardiovascular: Normal  rate, regular rhythm. Good peripheral circulation. Grossly normal heart sounds. Respiratory: Normal respiratory effort.  No retractions. Gastrointestinal: Soft and nontender. No distention.  Musculoskeletal: No lower extremity tenderness nor edema. No gross deformities of extremities. Neurologic:  Normal speech and language. No gross focal neurologic deficits are appreciated.  Skin:  Skin is warm, dry and intact. Psychiatric: Mood and affect are normal. Speech and behavior are normal.  ____________________________________________   LABS (all labs ordered are listed, but only abnormal results are displayed)  Labs Reviewed  COMPREHENSIVE METABOLIC PANEL - Abnormal; Notable for the following components:      Result Value   Potassium 3.1 (*)    Glucose, Bld 118 (*)    Total Protein 8.2 (*)    All other components within normal limits  CBC - Abnormal; Notable for the following components:   WBC 10.9 (*)    RBC 6.02 (*)    MCV 62.3 (*)    MCH 20.6 (*)    RDW 17.4 (*)    All other components within normal limits  URINALYSIS, COMPLETE (UACMP) WITH MICROSCOPIC - Abnormal; Notable for the following components:   Color, Urine AMBER (*)    APPearance CLOUDY (*)    Ketones, ur 5 (*)    Protein, ur 30 (*)    Leukocytes,Ua MODERATE (*)    Bacteria, UA RARE (*)    All other components within normal limits  LIPASE, BLOOD  HCG, QUANTITATIVE, PREGNANCY  POC URINE PREG, ED    Procedures   ____________________________________________   INITIAL IMPRESSION / MDM / ASSESSMENT AND PLAN / ED COURSE  As part of my medical decision making, I reviewed the following data within the [place]  Patient received IV Zofran and a liter of IV normal saline in the emergency department.  No further vomiting.  Patient be prescribed Zofran for home and advised regarding oral rehydration.  Also patient given Protonix as I suspect this to be secondary to acute alcoholic gastritis       ____________________________________________  FINAL CLINICAL IMPRESSION(S) / ED DIAGNOSES  Final diagnoses:  Acute alcoholic gastritis without hemorrhage     MEDICATIONS GIVEN DURING THIS VISIT:  Medications  ondansetron (ZOFRAN) injection 4 mg (4 mg Intravenous Given 11/10/18 0331)  sodium chloride 0.9 % bolus 1,000 mL (0 mLs Intravenous Stopped 11/10/18 0453)     ED Discharge Orders         Ordered    ondansetron (ZOFRAN ODT) 4 MG disintegrating tablet  Every 8 hours PRN     11/10/18 0453    pantoprazole (PROTONIX) 40 MG tablet  Daily     11/10/18 0453          *Please note:  Patricia Fargo was evaluated in Emergency Department on 11/10/2018 for the symptoms described in the  history of present illness. She was evaluated in the context of the global COVID-19 pandemic, which necessitated consideration that the patient might be at risk for infection with the SARS-CoV-2 virus that causes COVID-19. Institutional protocols and algorithms that pertain to the evaluation of patients at risk for COVID-19 are in a state of rapid change based on information released by regulatory bodies including the CDC and federal and state organizations. These policies and algorithms were followed during the patient's care in the ED.  Some ED evaluations and interventions may be delayed as a result of limited staffing during the pandemic.*  Note:  This document was prepared using Dragon voice recognition software and may include unintentional dictation errors.   Darci CurrentBrown, [place]idi[place] N, MD 11/10/18 [phone]

## 2018-11-10 NOTE — Telephone Encounter (Signed)
At Dr. Saul Fordyce request:  Called patient to inform her of uit and need for  Treatment.  Called in keftlex 500 mg twice daily for 5 days to [facility].

## 2019-09-18 ENCOUNTER — Other Ambulatory Visit: Payer: Self-pay

## 2019-09-18 ENCOUNTER — Emergency Department
Admission: EM | Admit: 2019-09-18 | Discharge: 2019-09-18 | Disposition: A | Discharge status: Home / Self Care | Payer: Medicaid Other | Attending: Emergency Medicine | Admitting: Emergency Medicine

## 2019-09-18 ENCOUNTER — Ambulatory Visit (LOCAL_COMMUNITY_HEALTH_CENTER): Payer: Self-pay

## 2019-09-18 ENCOUNTER — Emergency Department: Payer: Medicaid Other

## 2019-09-18 VITALS — BP 110/72 | Ht 60.0 in | Wt 161.0 lb

## 2019-09-18 DIAGNOSIS — R197 Diarrhea, unspecified: Secondary | ICD-10-CM

## 2019-09-18 DIAGNOSIS — Z20822 Contact with and (suspected) exposure to covid-19: Secondary | ICD-10-CM | POA: Diagnosis not present

## 2019-09-18 DIAGNOSIS — Z3A01 Less than 8 weeks gestation of pregnancy: Secondary | ICD-10-CM | POA: Insufficient documentation

## 2019-09-18 DIAGNOSIS — D72829 Elevated white blood cell count, unspecified: Secondary | ICD-10-CM | POA: Insufficient documentation

## 2019-09-18 DIAGNOSIS — O219 Vomiting of pregnancy, unspecified: Secondary | ICD-10-CM | POA: Diagnosis not present

## 2019-09-18 DIAGNOSIS — Z3201 Encounter for pregnancy test, result positive: Secondary | ICD-10-CM

## 2019-09-18 LAB — COMPREHENSIVE METABOLIC PANEL
ALT: 14 U/L (ref 0–44)
AST: 16 U/L (ref 15–41)
Albumin: 4.5 g/dL (ref 3.5–5.0)
Alkaline Phosphatase: 49 U/L (ref 38–126)
Anion gap: 10 (ref 5–15)
BUN: 6 mg/dL (ref 6–20)
CO2: 20 mmol/L — ABNORMAL LOW (ref 22–32)
Calcium: 9.4 mg/dL (ref 8.9–10.3)
Chloride: 107 mmol/L (ref 98–111)
Creatinine, Ser: 0.55 mg/dL (ref 0.44–1.00)
GFR calc Af Amer: >60 mL/min (ref >60)
GFR calc non Af Amer: >60 mL/min (ref >60)
Glucose, Bld: 156 mg/dL — ABNORMAL HIGH (ref 70–99)
Potassium: 3.7 mmol/L (ref 3.5–5.1)
Sodium: 137 mmol/L (ref 135–145)
Total Bilirubin: 1.2 mg/dL (ref 0.3–1.2)
Total Protein: 7.8 g/dL (ref 6.5–8.1)

## 2019-09-18 LAB — URINALYSIS, COMPLETE (UACMP) WITH MICROSCOPIC
Bilirubin Urine: NEGATIVE
Glucose, UA: 50 mg/dL — AB
Hgb urine dipstick: NEGATIVE
Ketones, ur: 80 mg/dL — AB
Nitrite: NEGATIVE
Protein, ur: 30 mg/dL — AB
Specific Gravity, Urine: 1.029 (ref 1.005–1.030)
pH: 6 (ref 5.0–8.0)

## 2019-09-18 LAB — CBC
HCT: 35.4 % — ABNORMAL LOW (ref 36.0–46.0)
Hemoglobin: 12 g/dL (ref 12.0–15.0)
MCH: 21.1 pg — ABNORMAL LOW (ref 26.0–34.0)
MCHC: 33.9 g/dL (ref 30.0–36.0)
MCV: 62.3 fL — ABNORMAL LOW (ref 80.0–100.0)
Platelets: 219 10*3/uL (ref 150–400)
RBC: 5.68 MIL/uL — ABNORMAL HIGH (ref 3.87–5.11)
RDW: 18.4 % — ABNORMAL HIGH (ref 11.5–15.5)
WBC: 20.9 10*3/uL — ABNORMAL HIGH (ref 4.0–10.5)
nRBC: 0 % (ref 0.0–0.2)

## 2019-09-18 LAB — PREGNANCY, URINE: Preg Test, Ur: POSITIVE — AB

## 2019-09-18 LAB — LIPASE, BLOOD: Lipase: 19 U/L (ref 11–51)

## 2019-09-18 LAB — SARS CORONAVIRUS 2 BY RT PCR (HOSPITAL ORDER, PERFORMED IN ~~LOC~~ HOSPITAL LAB): SARS Coronavirus 2: NEGATIVE

## 2019-09-18 LAB — HCG, QUANTITATIVE, PREGNANCY: hCG, Beta Chain, Quant, S: 26647 m[IU]/mL — ABNORMAL HIGH (ref <5)

## 2019-09-18 MED ORDER — ONDANSETRON 4 MG PO TBDP
4.0000 mg | ORAL_TABLET | Freq: Once | ORAL | Status: AC
  Administered 2019-09-18: 4 mg via ORAL
  Filled 2019-09-18: qty 1

## 2019-09-18 MED ORDER — SODIUM CHLORIDE 0.9% FLUSH: 3.0000 mL | Freq: Once | INTRAVENOUS | Status: DC

## 2019-09-18 MED ORDER — ONDANSETRON HCL 4 MG PO TABS: 4.0000 mg | ORAL_TABLET | Freq: Every day | ORAL | 0 refills | Status: DC | PRN

## 2019-09-18 MED ORDER — PRENATAL VITAMIN 27-0.8 MG PO TABS: 1.0000 | ORAL_TABLET | ORAL | 0 refills | Status: AC

## 2019-09-18 MED ORDER — SODIUM CHLORIDE 0.9 % IV BOLUS
1000.0000 mL | Freq: Once | INTRAVENOUS | Status: AC
  Administered 2019-09-18: 1000 mL via INTRAVENOUS

## 2019-09-18 MED ORDER — ONDANSETRON HCL 4 MG/2ML IJ SOLN
4.0000 mg | Freq: Once | INTRAMUSCULAR | Status: AC
  Administered 2019-09-18: 4 mg via INTRAVENOUS
  Filled 2019-09-18: qty 2

## 2019-09-18 NOTE — ED Triage Notes (Addendum)
Pt comes via POV from home with c/o nausea, vomiting and abdominal pain. Pt states that this started last night. Pt states she is [redacted] weeks pregnant.  Pt has not been to OBGYN yet. Pt states she just found out today.  Pt states some diarrhea.

## 2019-09-18 NOTE — Progress Notes (Signed)
Patient desires her prenatal care at [facility].

## 2019-09-18 NOTE — Discharge Instructions (Addendum)
Your white count was significantly elevated which could be due to some virus causing nausea, vomiting, diarrhea but can be caused by other things as well.  If you develop pain in your abdomen including the right upper quadrant you need to come back to the ER to be evaluated for gallstones.  If you develop pain in your right lower quadrant we will be worried about appendicitis.  These are both emergent conditions and require emergent imaging.  If you develop fevers you should also return to the ER. Or any other concerns.   Your ultrasound does confirm a pregnancy.  Please follow-up with your primary care doctor in 1 day for repeat abdominal exam and return to the ER if your symptoms are worsening at all   1. Single viable intrauterine pregnancy as above, estimated gestational age [redacted] weeks and 6 days by crown-rump length, with ultrasound EDC of 05/14/2020. 2. 2.5 cm degenerating right ovarian corpus luteal cyst with associated small volume free fluid within the pelvis. 3. No other acute maternal uterine or adnexal abnormality identified.

## 2019-09-18 NOTE — ED Provider Notes (Addendum)
[facility] Emergency Department Provider Note  ____________________________________________   First MD Initiated Contact with Patient 09/18/19 2006     (approximate)  I have reviewed the triage vital signs and the nursing notes.   HISTORY  Chief Complaint Abdominal Pain and Emesis    HPI Breanna Webb is a 34 y.o. female currently pregnant who comes in with vomiting.  Patient states that she has had some nausea and vomiting that started last night intermittent, nothing makes it better, nothing makes it worse.  She reports a little bit of hot flashes with some abdominal pain when she is vomiting but denies any abdominal pain at rest.  Currently she has no abdominal pain.  She thinks that she is about [redacted] weeks pregnant.  She is not seen OB yet.  She states that she just found out today about the pregnancy.  She does have some diarrhea.  She has had multiple episodes.  Denies having Covid before or being vaccinated.  She denies any vaginal bleeding, discharge or concerns for STDs.  She denies any urinary symptoms.            Past Medical History:  Diagnosis Date  . Anemia    Patient reports anemia with her pregnancies    There are no problems to display for this patient.   History reviewed. No pertinent surgical history.  Prior to Admission medications   Medication Sig Start Date End Date Taking? Authorizing Provider  ciprofloxacin (CIPRO) 500 MG tablet Take 1 tablet (500 mg total) by mouth 2 (two) times daily. Patient not taking: Reported on 09/18/2019 12/28/16   Charlestine Night, PA-C  diphenhydrAMINE (BENADRYL ALLERGY) 25 mg capsule Take 1 capsule (25 mg total) by mouth every 6 (six) hours as needed for up to 10 days. 04/18/17 04/28/17  Orvil Feil, PA-C  ibuprofen (ADVIL,MOTRIN) 800 MG tablet Take 1 tablet (800 mg total) by mouth every 8 (eight) hours as needed. Patient not taking: Reported on 09/18/2019 12/28/16   Charlestine Night, PA-C    nitrofurantoin, macrocrystal-monohydrate, (MACROBID) 100 MG capsule Take 1 capsule (100 mg total) by mouth 2 (two) times daily. Patient not taking: Reported on 09/18/2019 05/27/15   Gayla Doss, MD  ondansetron (ZOFRAN ODT) 4 MG disintegrating tablet Take 1 tablet (4 mg total) by mouth every 8 (eight) hours as needed for nausea or vomiting. Patient not taking: Reported on 09/18/2019 05/27/15   Gayla Doss, MD  ondansetron (ZOFRAN ODT) 4 MG disintegrating tablet Take 1 tablet (4 mg total) by mouth every 8 (eight) hours as needed. Patient not taking: Reported on 09/18/2019 11/10/18   Darci Current, MD  pantoprazole (PROTONIX) 40 MG tablet Take 1 tablet (40 mg total) by mouth daily. 11/10/18 12/10/18  Darci Current, MD  predniSONE (DELTASONE) 50 MG tablet Take one 50 mg tablet once a day for 5 days. Patient not taking: Reported on 09/18/2019 04/18/17   Orvil Feil, PA-C  Prenatal Vit-Fe Fumarate-FA (PRENATAL VITAMIN) 27-0.8 MG TABS Take 1 tablet by mouth 1 day or 1 dose for 1 dose. 09/18/19 09/19/19  Federico Flake, MD  promethazine (PHENERGAN) 25 MG tablet Take 1 tablet (25 mg total) by mouth every 8 (eight) hours as needed for nausea or vomiting. Patient not taking: Reported on 09/18/2019 12/28/16   Charlestine Night, PA-C    Allergies Patient has no known allergies.  No family history on file.  Social History Social History   Tobacco Use  . Smoking status:  Never Smoker  . Smokeless tobacco: Never Used  Vaping Use  . Vaping Use: Never used  Substance Use Topics  . Alcohol use: Not Currently  . Drug use: Not Currently    Frequency: 35.0 times per week    Types: Marijuana    Comment: she reports smoking a joint daily      Review of Systems Constitutional: No fever/chills Eyes: No visual changes. ENT: No sore throat. Cardiovascular: Denies chest pain. Respiratory: Denies shortness of breath. Gastrointestinal: Abdominal pain, nausea, vomiting,  diarrhea Genitourinary: Negative for dysuria. Musculoskeletal: Negative for back pain. Skin: Negative for rash. Neurological: Negative for headaches, focal weakness or numbness. All other ROS negative ____________________________________________   PHYSICAL EXAM:  VITAL SIGNS: ED Triage Vitals  Enc Vitals Group     BP 09/18/19 1851 129/79     Pulse Rate 09/18/19 1851 65     Resp 09/18/19 1851 17     Temp 09/18/19 1851 98 F (36.7 C)     Temp src --      SpO2 09/18/19 1851 100 %     Weight 09/18/19 1852 161 lb (73 kg)     Height 09/18/19 1852 5' (1.524 m)     Head Circumference --      Peak Flow --      Pain Score 09/18/19 1852 8     Pain Loc --      Pain Edu? --      Excl. in GC? --     Constitutional: Alert and oriented. Well appearing and in no acute distress. Eyes: Conjunctivae are normal. EOMI. Head: Atraumatic. Webb: No congestion/rhinnorhea. Mouth/Throat: Mucous membranes are moist.   Neck: No stridor. Trachea Midline. FROM Cardiovascular: Normal rate, regular rhythm. Grossly normal heart sounds.  Good peripheral circulation. Respiratory: Normal respiratory effort.  No retractions. Lungs CTAB. Gastrointestinal: Soft and nontender. No distention. No abdominal bruits.  Musculoskeletal: No lower extremity tenderness nor edema.  No joint effusions. Neurologic:  Normal speech and language. No gross focal neurologic deficits are appreciated.  Skin:  Skin is warm, dry and intact. No rash noted. Psychiatric: Mood and affect are normal. Speech and behavior are normal. GU: Deferred  Flank: NO cva tenderness  ____________________________________________   LABS (all labs ordered are listed, but only abnormal results are displayed)  Labs Reviewed  COMPREHENSIVE METABOLIC PANEL - Abnormal; Notable for the following components:      Result Value   CO2 20 (*)    Glucose, Bld 156 (*)    All other components within normal limits  CBC - Abnormal; Notable for the following  components:   WBC 20.9 (*)    RBC 5.68 (*)    HCT 35.4 (*)    MCV 62.3 (*)    MCH 21.1 (*)    RDW 18.4 (*)    All other components within normal limits  URINALYSIS, COMPLETE (UACMP) WITH MICROSCOPIC - Abnormal; Notable for the following components:   Color, Urine YELLOW (*)    APPearance HAZY (*)    Glucose, UA 50 (*)    Ketones, ur 80 (*)    Protein, ur 30 (*)    Leukocytes,Ua TRACE (*)    Bacteria, UA RARE (*)    All other components within normal limits  HCG, QUANTITATIVE, PREGNANCY - Abnormal; Notable for the following components:   hCG, Beta Chain, Quant, S 25,956 (*)    All other components within normal limits  SARS CORONAVIRUS 2 BY RT PCR (HOSPITAL ORDER, PERFORMED IN   HOSPITAL LAB)  GASTROINTESTINAL PANEL BY PCR, STOOL (REPLACES STOOL CULTURE)  C DIFFICILE QUICK SCREEN W PCR REFLEX  URINE CULTURE  LIPASE, BLOOD   ____________________________________________  RADIOLOGY   Official radiology report(s): US OB Transvaginal  Result Date: 09/18/2019 CLINICAL DATA:  Initial evaluation for acute abdominal pain, early pregnancy. EXAM: TRANSVAGINAL OB ULTRASOUND TECHNIQUE: Transvaginal ultrasound was performed for complete evaluation of the gestation as well as the maternal uterus, adnexal regions, and pelvic cul-de-sac. COMPARISON:  None. FINDINGS: Intrauterine gestational sac: Single Yolk sac:  Present Embryo:  Present Cardiac Activity: Present Heart Rate: 63 bpm CRL: 2.7 mm   5 w 6 d                  US EDC: 05/14/2020 Subchorionic hemorrhage:  None visualized. Maternal uterus/adnexae: Left ovary within normal limits. 2.5 cm degenerating corpus luteal cyst noted within the right ovary. Associated small volume free fluid within the pelvis. IMPRESSION: 1. Single viable intrauterine pregnancy as above, estimated gestational age [redacted] weeks and 6 days by crown-rump length, with ultrasound EDC of 05/14/2020. 2. 2.5 cm degenerating right ovarian corpus luteal cyst with  associated small volume free fluid within the pelvis. 3. No other acute maternal uterine or adnexal abnormality identified. Electronically Signed   By: Rise MuBenjamin  McClintock M.D.   On: 09/18/2019 20:15    ____________________________________________   PROCEDURES  Procedure(s) performed (including Critical Care):  Procedures   ____________________________________________   INITIAL IMPRESSION / ASSESSMENT AND PLAN / ED COURSE  Breanna NoseWendy Webb was evaluated in Emergency Department on 09/18/2019 for the symptoms described in the history of present illness. She was evaluated in the context of the global COVID-19 pandemic, which necessitated consideration that the patient might be at risk for infection with the SARS-CoV-2 virus that causes COVID-19. Institutional protocols and algorithms that pertain to the evaluation of patients at risk for COVID-19 are in a state of rapid change based on information released by regulatory bodies including the CDC and federal and state organizations. These policies and algorithms were followed during the patient's care in the ED.    Patient is a 34 year old who comes in with vomiting in the setting of pregnancy.  Labs are ordered to evaluate for hCG level and ultrasound evaluate for ectopic, intrauterine pregnancy, miscarriage.  Patient report a little bit of upper abdominal pain when she was vomiting but at this time her abdomen is soft and nontender.  She has no right upper quadrant pain to suggest cholecystitis and no right lower quadrant pain to suggest appendicitis or lower pain to suggest ovarian pathology.  She had prior CT scan 3 years ago did not show evidence of gallstones.  Labs were ordered to evaluate for Electra abnormalities, AKI, urine evaluate for UTI though she denies any symptoms.  Will send stool studies given her diarrhea if she is able to give a stool sample.  Will get Covid swab.  She denies any significant shortness of breath or cough to suggest  pneumonia.  She denies any vaginal bleeding to suggest needing type and screen to evaluate for RhoGam.  Labs are reassuring except her white count is significantly elevated at 20.9.  Otherwise she does not have any other sirs criteria with a normal heart rate, no fever, normal blood pressure.  It is possible that her nausea and vomiting could be causing a leukocytosis.  She is adamant that she is having no abdominal pain at this time therefore I think we will hold off on abdominal imaging but  I did discuss with her that if she develops right lower quadrant pain she went to be evaluated for appendicitis or right upper quadrant pain she been to be evaluate for cholecystitis.  She has no flank tenderness or urinary symptoms to suggest UTI.  She also has no flank tenderness to suggest kidney stone.    Her ultrasound does confirm intrauterine pregnancy.  Patient was given some fluids and IV Zofran to help with nausea.  Her urine did have some ketones in it but patient is getting fluids.  There are some whites in it but rare bacteria.  She has no symptoms of UTI and the samples not the best sample given her 6-10 squamous cells.  I doubt this represents an infection or asymptomatic bacteriuria.  Will send for culture and if it is positive they can treat her given she is pregnant.  9:34 PM on repeat evaluation patient is feeling much better.  Patient states after the fluid she feels more alive.  She not had any further nausea or vomiting and is tolerating drinking ginger ale.  On repeat abdominal exam she continues to be nontender throughout her abdomen.  Again I discussed with patient that if she was having any pain at all that I would want to proceed with imaging but she is adamant that she has no pain at this time.  We discussed that if she develops pain on her abdomen that she needs to return to the ER immediately given her high white count or if she develops any fevers.  Patient expressed understanding but  felt comfortable going home.  We will send patient a course of Zofran.  So far patient was unable to give stool sample.  Patient return to ER if her symptoms are getting worse or she has other concerns        ____________________________________________   FINAL CLINICAL IMPRESSION(S) / ED DIAGNOSES   Final diagnoses:  Nausea vomiting and diarrhea  Less than [redacted] weeks gestation of pregnancy  Leukocytosis, unspecified type      MEDICATIONS GIVEN DURING THIS VISIT:  Medications  sodium chloride flush (NS) 0.9 % injection 3 mL (3 mLs Intravenous Not Given 09/18/19 2015)  ondansetron (ZOFRAN-ODT) disintegrating tablet 4 mg (4 mg Oral Given 09/18/19 1858)  ondansetron (ZOFRAN) injection 4 mg (4 mg Intravenous Given 09/18/19 2041)  sodium chloride 0.9 % bolus 1,000 mL (1,000 mLs Intravenous New Bag/Given 09/18/19 2041)     ED Discharge Orders         Ordered    ondansetron (ZOFRAN) 4 MG tablet  Daily PRN     Discontinue  Reprint     09/18/19 2136           Note:  This document was prepared using Dragon voice recognition software and may include unintentional dictation errors.   Concha Se, MD 09/18/19 2139    Concha Se, MD 09/18/19 [phone]

## 2019-09-19 LAB — URINE CULTURE

## 2019-10-15 ENCOUNTER — Other Ambulatory Visit: Payer: Self-pay

## 2019-10-15 ENCOUNTER — Other Ambulatory Visit (HOSPITAL_COMMUNITY)
Admission: RE | Admit: 2019-10-15 | Discharge: 2019-10-15 | Disposition: A | Discharge status: Home / Self Care | Payer: Medicaid Other | Source: Ambulatory Visit | Attending: Obstetrics | Admitting: Obstetrics

## 2019-10-15 ENCOUNTER — Encounter: Payer: Self-pay | Admitting: Obstetrics

## 2019-10-15 ENCOUNTER — Ambulatory Visit (INDEPENDENT_AMBULATORY_CARE_PROVIDER_SITE_OTHER): Payer: Medicaid Other | Admitting: Obstetrics

## 2019-10-15 VITALS — BP 118/74

## 2019-10-15 DIAGNOSIS — Z3A10 10 weeks gestation of pregnancy: Secondary | ICD-10-CM

## 2019-10-15 DIAGNOSIS — Z3480 Encounter for supervision of other normal pregnancy, unspecified trimester: Secondary | ICD-10-CM | POA: Diagnosis not present

## 2019-10-15 DIAGNOSIS — Z3481 Encounter for supervision of other normal pregnancy, first trimester: Secondary | ICD-10-CM

## 2019-10-15 NOTE — Progress Notes (Signed)
NOB today. COP at [facility]. LMP 08/02/2019. Pt has had morning sickness.

## 2019-10-15 NOTE — Progress Notes (Signed)
New Obstetric Patient H&P    Chief Complaint: "Desires prenatal care"   History of Present Illness: Patient is a 34 y.o. [id number] Not Hispanic or Latino female, LMP 08/02/2019 presents with amenorrhea and positive home pregnancy test. Based on her  LMP, her EDD is Estimated Date of Delivery: 05/08/20 and her EGA is [redacted]w[redacted]d. Cycles are 7. days, regular, and occur approximately every : 28 days. Her last pap smear was about 1 years ago and was she cannot remember , but thinks that she needed follow up.    She had a urine pregnancy test which was positive about 4 week(s)  ago. Her last menstrual period was normal and lasted for  about 5 day(s). Since her LMP she claims she has experienced nausea, vimiting and some fatigue.. She denies vaginal bleeding. Her past medical history is noncontributory. Her prior pregnancies are notable for none  Since her LMP, she admits to the use of tobacco products  no She claims she has gained   no pounds since the start of her pregnancy.  There are cats in the home in the home  no  She admits close contact with children on a regular basis  yes  She has had chicken pox in the past no She has had Tuberculosis exposures, symptoms, or previously tested positive for TB   no Current or past history of domestic violence. no  Genetic Screening/Teratology Counseling: (Includes patient, baby's father, or anyone in either family with:)   1. Patient's age >/= 60 at [facility]  no 2. Thalassemia ([place], [place], Mediterranean, or Asian background): MCV<80  no 3. Neural tube defect (meningomyelocele, spina bifida, anencephaly)  no 4. Congenital heart defect  no  5. Down syndrome  no 6. Tay-Sachs (Jewish, [place]  no 7. Canavan's Disease  no 8. Sickle cell disease or trait (African)  no  9. Hemophilia or other blood disorders  no  10. Muscular dystrophy  no  11. Cystic fibrosis  no  12. Huntington's Chorea  no  13. Mental retardation/autism  no 14. Other  inherited genetic or chromosomal disorder  no 15. Maternal metabolic disorder (DM, PKU, etc)  no 16. Patient or FOB with a child with a birth defect not listed above no  16a. Patient or FOB with a birth defect themselves no 17. Recurrent pregnancy loss, or stillbirth  no  18. Any medications since LMP other than prenatal vitamins (include vitamins, supplements, OTC meds, drugs, alcohol)  Yes admits to daily use of MJ for nausea 19. Any other genetic/environmental exposure to discuss  no  Infection History:   1. Lives with someone with TB or TB exposed  no  2. Patient or partner has history of genital herpes  no 3. Rash or viral illness since LMP  no 4. History of STI (GC, CT, HPV, syphilis, HIV)  no 5. History of recent travel :  no  Other pertinent information:  no     Review of Systems:10 point review of systems negative unless otherwise noted in HPI  Past Medical History:  Past Medical History:  Diagnosis Date  . Anemia    Patient reports anemia with her pregnancies  . Miscarriage     Past Surgical History:  History reviewed. No pertinent surgical history.  Gynecologic History: Patient's last menstrual period was 08/02/2019.  Obstetric History: [id number]  Family History:  History reviewed. No pertinent family history.  Social History:  Social History   Socioeconomic History  . Marital status:  Single    Spouse name: Not on file  . Number of children: Not on file  . Years of education: Not on file  . Highest education level: Not on file  Occupational History  . Not on file  Tobacco Use  . Smoking status: Never Smoker  . Smokeless tobacco: Never Used  Vaping Use  . Vaping Use: Never used  Substance and Sexual Activity  . Alcohol use: Not Currently  . Drug use: Not Currently    Frequency: 35.0 times per week    Types: Marijuana    Comment: she reports smoking a joint daily  . Sexual activity: Yes    Birth control/protection: None  Other Topics Concern  .  Not on file  Social History Narrative  . Not on file   Social Determinants of Health   Financial Resource Strain:   . Difficulty of Paying Living Expenses:   Food Insecurity:   . Worried About Programme researcher, broadcasting/film/video in the Last Year:   . Barista in the Last Year:   Transportation Needs:   . Freight forwarder (Medical):   Marland Kitchen Lack of Transportation (Non-Medical):   Physical Activity:   . Days of Exercise per Week:   . Minutes of Exercise per Session:   Stress:   . Feeling of Stress :   Social Connections:   . Frequency of Communication with Friends and Family:   . Frequency of Social Gatherings with Friends and Family:   . Attends Religious Services:   . Active Member of Clubs or Organizations:   . Attends Banker Meetings:   Marland Kitchen Marital Status:   Intimate Partner Violence: Not At Risk  . Fear of Current or Ex-Partner: No  . Emotionally Abused: No  . Physically Abused: No  . Sexually Abused: No    Allergies:  No Known Allergies  Medications: Prior to Admission medications   Medication Sig Start Date End Date Taking? Authorizing Provider  ondansetron (ZOFRAN) 4 MG tablet Take 1 tablet (4 mg total) by mouth daily as needed for nausea or vomiting. 09/18/19 09/17/20 Yes Concha Se, MD  ciprofloxacin (CIPRO) 500 MG tablet Take 1 tablet (500 mg total) by mouth 2 (two) times daily. Patient not taking: Reported on 09/18/2019 12/28/16   Charlestine Night, PA-C  diphenhydrAMINE (BENADRYL ALLERGY) 25 mg capsule Take 1 capsule (25 mg total) by mouth every 6 (six) hours as needed for up to 10 days. 04/18/17 04/28/17  Orvil Feil, PA-C  ibuprofen (ADVIL,MOTRIN) 800 MG tablet Take 1 tablet (800 mg total) by mouth every 8 (eight) hours as needed. Patient not taking: Reported on 09/18/2019 12/28/16   Charlestine Night, PA-C  nitrofurantoin, macrocrystal-monohydrate, (MACROBID) 100 MG capsule Take 1 capsule (100 mg total) by mouth 2 (two) times daily. Patient not  taking: Reported on 09/18/2019 05/27/15   Gayla Doss, MD  ondansetron (ZOFRAN ODT) 4 MG disintegrating tablet Take 1 tablet (4 mg total) by mouth every 8 (eight) hours as needed for nausea or vomiting. Patient not taking: Reported on 09/18/2019 05/27/15   Gayla Doss, MD  ondansetron (ZOFRAN ODT) 4 MG disintegrating tablet Take 1 tablet (4 mg total) by mouth every 8 (eight) hours as needed. Patient not taking: Reported on 09/18/2019 11/10/18   Darci Current, MD  pantoprazole (PROTONIX) 40 MG tablet Take 1 tablet (40 mg total) by mouth daily. 11/10/18 12/10/18  Darci Current, MD  predniSONE (DELTASONE) 50 MG tablet Take one 50  mg tablet once a day for 5 days. Patient not taking: Reported on 09/18/2019 04/18/17   Orvil Feil, PA-C  promethazine (PHENERGAN) 25 MG tablet Take 1 tablet (25 mg total) by mouth every 8 (eight) hours as needed for nausea or vomiting. Patient not taking: Reported on 09/18/2019 12/28/16   Charlestine Night, PA-C    Physical Exam Vitals: Blood pressure 118/74, last menstrual period 08/02/2019.  General: NAD HEENT: normocephalic, anicteric Thyroid: no enlargement, no palpable nodules Pulmonary: No increased work of breathing, CTAB Cardiovascular: RRR, distal pulses 2+ Abdomen: NABS, soft, non-tender, non-distended.  Umbilicus without lesions.  No hepatomegaly, splenomegaly or masses palpable. No evidence of hernia  Genitourinary:  External: Normal external female genitalia.  Normal urethral meatus, normal  Bartholin's and Skene's glands.    Vagina: Normal vaginal mucosa, no evidence of prolapse.    Cervix: Grossly normal in appearance, no bleeding  Uterus: anteverted, enlarged, mobile, normal contour.  No CMT  Adnexa: ovaries non-enlarged, no adnexal masses  Rectal: deferred Extremities: no edema, erythema, or tenderness Neurologic: Grossly intact Psychiatric: mood appropriate, affect full   Assessment: 34 y.o. [id number] at [redacted]w[redacted]d presenting to initiate  prenatal care  Plan: 1) Avoid alcoholic beverages. 2) Patient encouraged not to smoke.  3) Discontinue the use of all non-medicinal drugs and chemicals.  4) Take prenatal vitamins daily.  5) Nutrition, food safety (fish, cheese advisories, and high nitrite foods) and exercise discussed. 6) Hospital and practice style discussed with cross coverage system.  7) Genetic Screening, such as with 1st Trimester Screening, cell free fetal DNA, AFP testing, and Ultrasound, as well as with amniocentesis and CVS as appropriate, is discussed with patient. At the conclusion of today's visit patient requested genetic testing 8) Patient is asked about travel to areas at risk for the Zika virus, and counseled to avoid travel and exposure to mosquitoes or sexual partners who may have themselves been exposed to the virus. Testing is discussed, and will be ordered as appropriate.  Pap drawn today with Nuswab.  Dating ultrasound ordered with labwork for 2 weeks hence. Advised to refrain from MJ use. She has Zofran. Breastfeeding discussed and encouraged.  She will inquire of her insurance re the [facility] testing, [employer] ordered.  Mirna Mires, CNM  10/15/2019 3:52 PM

## 2019-10-17 LAB — URINE CULTURE

## 2019-10-17 LAB — CYTOLOGY - PAP
Comment: NEGATIVE
Diagnosis: NEGATIVE
High risk HPV: POSITIVE — AB

## 2019-10-19 LAB — NUSWAB VAGINITIS PLUS (VG+)
Atopobium vaginae: HIGH Score — AB
BVAB 2: HIGH Score — AB
Candida albicans, NAA: NEGATIVE
Candida glabrata, NAA: NEGATIVE
Chlamydia trachomatis, NAA: NEGATIVE
Megasphaera 1: HIGH Score — AB
Neisseria gonorrhoeae, NAA: NEGATIVE
Trich vag by NAA: POSITIVE — AB

## 2019-10-22 ENCOUNTER — Other Ambulatory Visit: Payer: Self-pay | Admitting: Obstetrics

## 2019-10-22 DIAGNOSIS — Z3480 Encounter for supervision of other normal pregnancy, unspecified trimester: Secondary | ICD-10-CM

## 2019-10-22 DIAGNOSIS — N760 Acute vaginitis: Secondary | ICD-10-CM

## 2019-10-22 DIAGNOSIS — A5901 Trichomonal vulvovaginitis: Secondary | ICD-10-CM

## 2019-10-22 MED ORDER — METRONIDAZOLE 500 MG PO TABS: 2000.0000 mg | ORAL_TABLET | Freq: Once | ORAL | 1 refills | Status: AC

## 2019-10-22 NOTE — Progress Notes (Signed)
Reviewed Panda's Nuswab, which is positive for both BV and Trichomonas. Contacted by phone, and instructed to take the medication prescribed. She is [redacted] weeks gestation-  Advised not to have IC with her partner until they have both been treated. Additional diagnosis padded to her Problem list.

## 2019-10-29 ENCOUNTER — Encounter: Payer: Medicaid Other | Admitting: Obstetrics

## 2019-10-29 ENCOUNTER — Ambulatory Visit: Payer: Medicaid Other

## 2019-10-29 DIAGNOSIS — Z3480 Encounter for supervision of other normal pregnancy, unspecified trimester: Secondary | ICD-10-CM

## 2019-10-29 DIAGNOSIS — A5901 Trichomonal vulvovaginitis: Secondary | ICD-10-CM | POA: Insufficient documentation

## 2019-10-29 DIAGNOSIS — Z3A10 10 weeks gestation of pregnancy: Secondary | ICD-10-CM

## 2019-10-31 ENCOUNTER — Ambulatory Visit (INDEPENDENT_AMBULATORY_CARE_PROVIDER_SITE_OTHER): Payer: Medicaid Other | Admitting: Obstetrics & Gynecology

## 2019-10-31 ENCOUNTER — Encounter: Payer: Self-pay | Admitting: Obstetrics & Gynecology

## 2019-10-31 ENCOUNTER — Ambulatory Visit (INDEPENDENT_AMBULATORY_CARE_PROVIDER_SITE_OTHER): Payer: Medicaid Other

## 2019-10-31 ENCOUNTER — Other Ambulatory Visit: Payer: Self-pay

## 2019-10-31 VITALS — BP 120/80 | Wt 152.0 lb

## 2019-10-31 DIAGNOSIS — Z1379 Encounter for other screening for genetic and chromosomal anomalies: Secondary | ICD-10-CM

## 2019-10-31 DIAGNOSIS — Z3481 Encounter for supervision of other normal pregnancy, first trimester: Secondary | ICD-10-CM

## 2019-10-31 DIAGNOSIS — Z3A12 12 weeks gestation of pregnancy: Secondary | ICD-10-CM | POA: Diagnosis not present

## 2019-10-31 LAB — POCT URINALYSIS DIPSTICK OB
Glucose, UA: NEGATIVE
POC,PROTEIN,UA: NEGATIVE

## 2019-10-31 MED ORDER — ONDANSETRON 4 MG PO TBDP: 4.0000 mg | ORAL_TABLET | Freq: Three times a day (TID) | ORAL | 0 refills | Status: DC | PRN

## 2019-10-31 NOTE — Addendum Note (Signed)
Addended by: Cornelius Moras D on: 10/31/2019 03:31 PM   Modules accepted: Orders

## 2019-10-31 NOTE — Progress Notes (Signed)
Prenatal Visit Note Date: 10/31/2019 Clinic: [facility]  Subjective:  Breanna Webb is a 34 y.o. [id number] at [redacted]w[redacted]d being seen today for ongoing prenatal care.  She is currently monitored for the following issues for this low-risk pregnancy and has Supervision of other normal pregnancy, antepartum and Trichomonas vaginitis on their problem list.  Patient reports no complaints.   She has taken the Flagyl yesterday Sh ehas less nausea but still present, Zofran helps  The following portions of the patient's history were reviewed and updated as appropriate: allergies, current medications, past family history, past medical history, past social history, past surgical history and problem list. Problem list updated.  Objective:   Vitals:   10/31/19 1504  BP: 120/80  Weight: 152 lb (68.9 kg)    Fetal Status:          Review of ULTRASOUND.    I have personally reviewed images and report of recent ultrasound done at [facility].    EDC confirmed, singleton IUP      General:  Alert, oriented and cooperative. Patient is in no acute distress.  Skin: Skin is warm and dry. No rash noted.   Cardiovascular: Normal heart rate noted  Respiratory: Normal respiratory effort, no problems with respiration noted  Abdomen: Soft, gravid, appropriate for gestational age. Pain/Pressure: Absent     Pelvic:  Cervical exam deferred        Extremities: Normal range of motion.     Mental Status: Normal mood and affect. Normal behavior. Normal judgment and thought content.   Urinalysis:      Assessment and Plan:  Pregnancy: G5P3013 at [redacted]w[redacted]d  1. [redacted] weeks gestation of pregnancy PNV Labs soon  2. Encounter for supervision of other normal pregnancy in first trimester Zofran to cont w refill for nausea  3. Encounter for genetic screening for Down Syndrome - soon (no phloebtomist here today) - MaterniT21 PLUS Core+SCA; Future  Birthcontrol options discussed  Return in about 1 day (around 11/01/2019) for Lab  appontment soon for prenatal labs, ROB in 4 weeks.  Annamarie Major, MD, [facility], [facility] 10/31/2019  3:25 PM

## 2019-10-31 NOTE — Patient Instructions (Signed)

## 2019-11-07 ENCOUNTER — Other Ambulatory Visit: Payer: Self-pay

## 2019-11-07 ENCOUNTER — Other Ambulatory Visit: Payer: Medicaid Other

## 2019-11-07 DIAGNOSIS — Z3480 Encounter for supervision of other normal pregnancy, unspecified trimester: Secondary | ICD-10-CM | POA: Diagnosis not present

## 2019-11-07 DIAGNOSIS — Z3A10 10 weeks gestation of pregnancy: Secondary | ICD-10-CM

## 2019-11-07 DIAGNOSIS — Z1379 Encounter for other screening for genetic and chromosomal anomalies: Secondary | ICD-10-CM | POA: Diagnosis not present

## 2019-11-07 LAB — OB RESULTS CONSOLE VARICELLA ZOSTER ANTIBODY, IGG: Varicella: IMMUNE

## 2019-11-08 LAB — RPR+RH+ABO+RUB AB+AB SCR+CB...
Antibody Screen: NEGATIVE
HIV Screen 4th Generation wRfx: NONREACTIVE
Hematocrit: 30.9 % — ABNORMAL LOW (ref 34.0–46.6)
Hemoglobin: 10.2 g/dL — ABNORMAL LOW (ref 11.1–15.9)
Hepatitis B Surface Ag: NEGATIVE
MCH: 21.2 pg — ABNORMAL LOW (ref 26.6–33.0)
MCHC: 33 g/dL (ref 31.5–35.7)
MCV: 64 fL — ABNORMAL LOW (ref 79–97)
Platelets: 242 10*3/uL (ref 150–450)
RBC: 4.82 x10E6/uL (ref 3.77–5.28)
RDW: 17.2 % — ABNORMAL HIGH (ref 11.7–15.4)
RPR Ser Ql: NONREACTIVE
Rh Factor: POSITIVE
Rubella Antibodies, IGG: 2.77 index (ref >0.99)
Varicella zoster IgG: 267 index (ref >165)
WBC: 12.1 10*3/uL — ABNORMAL HIGH (ref 3.4–10.8)

## 2019-11-11 LAB — MATERNIT21 PLUS CORE+SCA
Fetal Fraction: 6
Monosomy X (Turner Syndrome): NOT DETECTED
Result (T21): NEGATIVE
Trisomy 13 (Patau syndrome): NEGATIVE
Trisomy 18 (Edwards syndrome): NEGATIVE
Trisomy 21 (Down syndrome): NEGATIVE
XXX (Triple X Syndrome): NOT DETECTED
XXY (Klinefelter Syndrome): NOT DETECTED
XYY (Jacobs Syndrome): NOT DETECTED

## 2019-11-23 LAB — INHERITEST CORE(CF97,SMA,FRAX)

## 2019-11-28 ENCOUNTER — Encounter: Payer: Medicaid Other | Admitting: Obstetrics

## 2019-11-29 ENCOUNTER — Telehealth: Payer: Self-pay | Admitting: Obstetrics & Gynecology

## 2019-11-29 NOTE — Telephone Encounter (Signed)
Pt aware.  States she may send her sister to p/u.

## 2019-11-29 NOTE — Telephone Encounter (Signed)
Patient called needing her gender results printed out. Please call patient to come pick them up

## 2019-12-03 ENCOUNTER — Encounter (HOSPITAL_COMMUNITY): Payer: Self-pay | Admitting: Family Medicine

## 2019-12-03 ENCOUNTER — Inpatient Hospital Stay (HOSPITAL_COMMUNITY): Payer: Medicaid Other

## 2019-12-03 ENCOUNTER — Inpatient Hospital Stay (HOSPITAL_COMMUNITY)
Admission: AD | Admit: 2019-12-03 | Discharge: 2019-12-03 | Disposition: A | Discharge status: Home / Self Care | Payer: Medicaid Other | Attending: Family Medicine | Admitting: Family Medicine

## 2019-12-03 ENCOUNTER — Other Ambulatory Visit: Payer: Self-pay

## 2019-12-03 DIAGNOSIS — Z3A17 17 weeks gestation of pregnancy: Secondary | ICD-10-CM | POA: Diagnosis not present

## 2019-12-03 DIAGNOSIS — R1011 Right upper quadrant pain: Secondary | ICD-10-CM | POA: Diagnosis not present

## 2019-12-03 DIAGNOSIS — O99891 Other specified diseases and conditions complicating pregnancy: Secondary | ICD-10-CM | POA: Insufficient documentation

## 2019-12-03 DIAGNOSIS — M549 Dorsalgia, unspecified: Secondary | ICD-10-CM | POA: Diagnosis not present

## 2019-12-03 DIAGNOSIS — Z87891 Personal history of nicotine dependence: Secondary | ICD-10-CM | POA: Diagnosis not present

## 2019-12-03 DIAGNOSIS — O99612 Diseases of the digestive system complicating pregnancy, second trimester: Secondary | ICD-10-CM | POA: Insufficient documentation

## 2019-12-03 DIAGNOSIS — O26892 Other specified pregnancy related conditions, second trimester: Secondary | ICD-10-CM | POA: Diagnosis not present

## 2019-12-03 DIAGNOSIS — R109 Unspecified abdominal pain: Secondary | ICD-10-CM

## 2019-12-03 DIAGNOSIS — K5900 Constipation, unspecified: Secondary | ICD-10-CM | POA: Diagnosis not present

## 2019-12-03 DIAGNOSIS — Z79899 Other long term (current) drug therapy: Secondary | ICD-10-CM | POA: Diagnosis not present

## 2019-12-03 DIAGNOSIS — O26612 Liver and biliary tract disorders in pregnancy, second trimester: Secondary | ICD-10-CM

## 2019-12-03 DIAGNOSIS — K828 Other specified diseases of gallbladder: Secondary | ICD-10-CM

## 2019-12-03 DIAGNOSIS — O99322 Drug use complicating pregnancy, second trimester: Secondary | ICD-10-CM | POA: Diagnosis not present

## 2019-12-03 DIAGNOSIS — O219 Vomiting of pregnancy, unspecified: Secondary | ICD-10-CM

## 2019-12-03 HISTORY — DX: Unspecified infectious disease: B99.9

## 2019-12-03 HISTORY — DX: Unspecified abnormal cytological findings in specimens from vagina: R87.629

## 2019-12-03 LAB — CBC
HCT: 31.7 % — ABNORMAL LOW (ref 36.0–46.0)
Hemoglobin: 10.8 g/dL — ABNORMAL LOW (ref 12.0–15.0)
MCH: 21.8 pg — ABNORMAL LOW (ref 26.0–34.0)
MCHC: 34.1 g/dL (ref 30.0–36.0)
MCV: 63.9 fL — ABNORMAL LOW (ref 80.0–100.0)
Platelets: 259 10*3/uL (ref 150–400)
RBC: 4.96 MIL/uL (ref 3.87–5.11)
RDW: 17.1 % — ABNORMAL HIGH (ref 11.5–15.5)
WBC: 14.5 10*3/uL — ABNORMAL HIGH (ref 4.0–10.5)
nRBC: 0.3 % — ABNORMAL HIGH (ref 0.0–0.2)

## 2019-12-03 LAB — LIPASE, BLOOD: Lipase: 22 U/L (ref 11–51)

## 2019-12-03 LAB — URINALYSIS, ROUTINE W REFLEX MICROSCOPIC
Glucose, UA: NEGATIVE mg/dL
Hgb urine dipstick: NEGATIVE
Ketones, ur: 20 mg/dL — AB
Leukocytes,Ua: NEGATIVE
Nitrite: NEGATIVE
Protein, ur: 100 mg/dL — AB
Specific Gravity, Urine: 1.025 (ref 1.005–1.030)
pH: 6 (ref 5.0–8.0)

## 2019-12-03 LAB — COMPREHENSIVE METABOLIC PANEL
ALT: 76 U/L — ABNORMAL HIGH (ref 0–44)
AST: 55 U/L — ABNORMAL HIGH (ref 15–41)
Albumin: 3.5 g/dL (ref 3.5–5.0)
Alkaline Phosphatase: 47 U/L (ref 38–126)
Anion gap: 13 (ref 5–15)
BUN: <5 mg/dL — ABNORMAL LOW (ref 6–20)
CO2: 23 mmol/L (ref 22–32)
Calcium: 9.5 mg/dL (ref 8.9–10.3)
Chloride: 97 mmol/L — ABNORMAL LOW (ref 98–111)
Creatinine, Ser: 0.46 mg/dL (ref 0.44–1.00)
GFR calc Af Amer: >60 mL/min (ref >60)
GFR calc non Af Amer: >60 mL/min (ref >60)
Glucose, Bld: 108 mg/dL — ABNORMAL HIGH (ref 70–99)
Potassium: 3.2 mmol/L — ABNORMAL LOW (ref 3.5–5.1)
Sodium: 133 mmol/L — ABNORMAL LOW (ref 135–145)
Total Bilirubin: 2.1 mg/dL — ABNORMAL HIGH (ref 0.3–1.2)
Total Protein: 7.2 g/dL (ref 6.5–8.1)

## 2019-12-03 MED ORDER — PROMETHAZINE HCL 25 MG/ML IJ SOLN
25.0000 mg | Freq: Once | INTRAMUSCULAR | Status: AC
  Administered 2019-12-03: 25 mg via INTRAVENOUS
  Filled 2019-12-03: qty 1

## 2019-12-03 MED ORDER — SIMETHICONE 80 MG PO CHEW
80.0000 mg | CHEWABLE_TABLET | Freq: Once | ORAL | Status: AC
  Administered 2019-12-03: 80 mg via ORAL
  Filled 2019-12-03: qty 1

## 2019-12-03 MED ORDER — PROMETHAZINE HCL 12.5 MG PO TABS: 12.5000 mg | ORAL_TABLET | Freq: Four times a day (QID) | ORAL | 0 refills | Status: DC | PRN

## 2019-12-03 MED ORDER — DOXYLAMINE-PYRIDOXINE 10-10 MG PO TBEC: 1.0000 | DELAYED_RELEASE_TABLET | Freq: Every day | ORAL | 2 refills | Status: DC

## 2019-12-03 MED ORDER — LACTATED RINGERS IV BOLUS
1000.0000 mL | Freq: Once | INTRAVENOUS | Status: AC
  Administered 2019-12-03: 1000 mL via INTRAVENOUS

## 2019-12-03 MED ORDER — SIMETHICONE 80 MG PO CHEW
80.0000 mg | CHEWABLE_TABLET | Freq: Four times a day (QID) | ORAL | 0 refills | Status: DC | PRN
Start: 2019-12-03 — End: 2020-03-10

## 2019-12-03 NOTE — Discharge Instructions (Signed)
Gallbladder Eating Plan If you have a gallbladder condition, you may have trouble digesting fats. Eating a low-fat diet can help reduce your symptoms, and may be helpful before and after having surgery to remove your gallbladder (cholecystectomy). Your health care provider may recommend that you work with a diet and nutrition specialist (dietitian) to help you reduce the amount of fat in your diet. What are tips for following this plan? General guidelines  Limit your fat intake to less than 30% of your total daily calories. If you eat around 1,800 calories each day, this is less than 60 grams (g) of fat per day.  Fat is an important part of a healthy diet. Eating a low-fat diet can make it hard to maintain a healthy body weight. Ask your dietitian how much fat, calories, and other nutrients you need each day.  Eat small, frequent meals throughout the day instead of three large meals.  Drink at least 8-10 cups of fluid a day. Drink enough fluid to keep your urine clear or pale yellow.  Limit alcohol intake to no more than 1 drink a day for nonpregnant women and 2 drinks a day for men. One drink equals 12 oz of beer, 5 oz of wine, or 1 oz of hard liquor. Reading food labels  Check Nutrition Facts on food labels for the amount of fat per serving. Choose foods with less than 3 grams of fat per serving. Shopping  Choose nonfat and low-fat healthy foods. Look for the words "nonfat," "low fat," or "fat free."  Avoid buying processed or prepackaged foods. Cooking  Cook using low-fat methods, such as baking, broiling, grilling, or boiling.  Cook with small amounts of healthy fats, such as olive oil, grapeseed oil, canola oil, or sunflower oil. What foods are recommended?   All fresh, frozen, or canned fruits and vegetables.  Whole grains.  Low-fat or non-fat (skim) milk and yogurt.  Lean meat, skinless poultry, fish, eggs, and beans.  Low-fat protein supplement powders or  drinks.  Spices and herbs. What foods are not recommended?  High-fat foods. These include baked goods, fast food, fatty cuts of meat, ice cream, french toast, sweet rolls, pizza, cheese bread, foods covered with butter, creamy sauces, or cheese.  Fried foods. These include french fries, tempura, battered fish, breaded chicken, fried breads, and sweets.  Foods with strong odors.  Foods that cause bloating and gas. Summary  A low-fat diet can be helpful if you have a gallbladder condition, or before and after gallbladder surgery.  Limit your fat intake to less than 30% of your total daily calories. This is about 60 g of fat if you eat 1,800 calories each day.  Eat small, frequent meals throughout the day instead of three large meals. This information is not intended to replace advice given to you by your health care provider. Make sure you discuss any questions you have with your health care provider. Document Revised: 06/08/2018 Document Reviewed: 03/25/2016 Elsevier Patient Education  2020 Elsevier Inc.  

## 2019-12-03 NOTE — MAU Provider Note (Signed)
Chief Complaint:  Abdominal Pain, Back Pain, Emesis, and Constipation   First Provider Initiated Contact with Patient 12/03/19 1224     HPI: Breanna Webb is a 34 y.o. [id number] at [redacted]w[redacted]d who presents to maternity admissions reporting abdominal pain and bloating, nausea/vomiting for a week and constipation. She hasn't had a bowel movement in a week, and hasn't been able to keep anything down since last Wednesday. She has been taking zofran for pregnancy sickness and also uses MJ for nausea management. Denies vaginal bleeding, leaking of fluid, decreased fetal movement, fever, falls, or recent illness.   Pregnancy Course: Uncomplicated, seen at [facility] in [place], [place].   Past Medical History:  Diagnosis Date  . Anemia    Patient reports anemia with her pregnancies  . Infection    UTI  . Miscarriage   . Vaginal Pap smear, abnormal    OB History  Gravida Para Term Preterm AB Living  5 3 3   1 3   SAB TAB Ectopic Multiple Live Births  1       3    # Outcome Date GA Lbr Len/2nd Weight Sex Delivery Anes PTL Lv  5 Current           4 Term      Vag-Spont  N LIV  3 Term      Vag-Spont  N LIV  2 Term      Vag-Spont  N LIV  1 SAB            Past Surgical History:  Procedure Laterality Date  . NO PAST SURGERIES     Family History  Problem Relation Age of Onset  . Healthy Mother   . Healthy Father    Social History   Tobacco Use  . Smoking status: Former  . Smokeless tobacco: Never Used  . Tobacco comment: quit 2015  Vaping Use  . Vaping Use: Never used  Substance Use Topics  . Alcohol use: Not Currently  . Drug use: Not Currently    Types: Marijuana    Comment: smoking when zofran not working,  last 10/3   No Known Allergies Medications Prior to Admission  Medication Sig Dispense Refill Last Dose  . diphenhydrAMINE (BENADRYL ALLERGY) 25 mg capsule Take 1 capsule (25 mg total) by mouth every 6 (six) hours as needed for up to 10 days. 30 capsule 0 Past Month  at Unknown time  . ondansetron (ZOFRAN) 4 MG tablet Take 1 tablet (4 mg total) by mouth daily as needed for nausea or vomiting. 15 tablet 0 12/02/2019 at Unknown time  . pantoprazole (PROTONIX) 40 MG tablet Take 1 tablet (40 mg total) by mouth daily. (Patient taking differently: Take 40 mg by mouth daily. ) 30 tablet 0 12/03/2019 at Unknown time  . Prenatal Vit-Fe Fumarate-FA (MULTIVITAMIN-PRENATAL) 27-0.8 MG TABS tablet Take 1 tablet by mouth daily at 12 noon.   Past Week at Unknown time  . ondansetron (ZOFRAN ODT) 4 MG disintegrating tablet Take 1 tablet (4 mg total) by mouth every 8 (eight) hours as needed for nausea or vomiting. 24 tablet 0    I have reviewed patient's Past Medical Hx, Surgical Hx, Family Hx, Social Hx, medications and allergies.   ROS:  Review of Systems  Constitutional: Negative for fatigue and fever.  Respiratory: Negative for cough and shortness of breath.   Gastrointestinal: Positive for abdominal distention, abdominal pain, constipation, nausea and vomiting.  Genitourinary: Negative for pelvic pain and vaginal bleeding.  All other  systems reviewed and are negative.   Physical Exam   Patient Vitals for the past 24 hrs:  BP Temp Temp src Pulse Resp SpO2 Height Weight  12/03/19 1647 136/86 -- -- 63 -- -- -- --  12/03/19 1152 122/89 98.9 F (37.2 C) Oral 77 16 99 % 5' (1.524 m) 148 lb 6.4 oz (67.3 kg)    Constitutional: Well-developed, well-nourished female in mild distress.  Cardiovascular: normal rate & rhythm, no murmur Respiratory: normal effort, lung sounds clear throughout GI: Abd soft, non-tender, gravid appropriate for gestational age. BS present but minimal x4 MS: Extremities nontender, no edema, normal ROM Neurologic: Alert and oriented x 4.  Pelvic exam deferred FHR: 146  Labs: Results for orders placed or performed during the hospital encounter of 12/03/19 (from the past 24 hour(s))  Comprehensive metabolic panel     Status: Abnormal   Collection  Time: 12/03/19 11:59 AM  Result Value Ref Range   Sodium 133 (L) 135 - 145 mmol/L   Potassium 3.2 (L) 3.5 - 5.1 mmol/L   Chloride 97 (L) 98 - 111 mmol/L   CO2 23 22 - 32 mmol/L   Glucose, Bld 108 (H) 70 - 99 mg/dL   BUN <5 (L) 6 - 20 mg/dL   Creatinine, Ser 0.25 0.44 - 1.00 mg/dL   Calcium 9.5 8.9 - 85.2 mg/dL   Total Protein 7.2 6.5 - 8.1 g/dL   Albumin 3.5 3.5 - 5.0 g/dL   AST 55 (H) 15 - 41 U/L   ALT 76 (H) 0 - 44 U/L   Alkaline Phosphatase 47 38 - 126 U/L   Total Bilirubin 2.1 (H) 0.3 - 1.2 mg/dL   GFR calc non Af Amer >60 >60 mL/min   GFR calc Af Amer >60 >60 mL/min   Anion gap 13 5 - 15  CBC     Status: Abnormal   Collection Time: 12/03/19 11:59 AM  Result Value Ref Range   WBC 14.5 (H) 4.0 - 10.5 K/uL   RBC 4.96 3.87 - 5.11 MIL/uL   Hemoglobin 10.8 (L) 12.0 - 15.0 g/dL   HCT 77.8 (L) 36 - 46 %   MCV 63.9 (L) 80.0 - 100.0 fL   MCH 21.8 (L) 26.0 - 34.0 pg   MCHC 34.1 30.0 - 36.0 g/dL   RDW 24.2 (H) 35.3 - 61.4 %   Platelets 259 150 - 400 K/uL   nRBC 0.3 (H) 0.0 - 0.2 %  Urinalysis, Routine w reflex microscopic Urine, Clean Catch     Status: Abnormal   Collection Time: 12/03/19 12:08 PM  Result Value Ref Range   Color, Urine AMBER (A) YELLOW   APPearance HAZY (A) CLEAR   Specific Gravity, Urine 1.025 1.005 - 1.030   pH 6.0 5.0 - 8.0   Glucose, UA NEGATIVE NEGATIVE mg/dL   Hgb urine dipstick NEGATIVE NEGATIVE   Bilirubin Urine MODERATE (A) NEGATIVE   Ketones, ur 20 (A) NEGATIVE mg/dL   Protein, ur 431 (A) NEGATIVE mg/dL   Nitrite NEGATIVE NEGATIVE   Leukocytes,Ua NEGATIVE NEGATIVE   RBC / HPF 0-5 0 - 5 RBC/hpf   WBC, UA 0-5 0 - 5 WBC/hpf   Bacteria, UA RARE (A) NONE SEEN   Squamous Epithelial / LPF 11-20 0 - 5   Mucus PRESENT   Lipase, blood     Status: None   Collection Time: 12/03/19  2:37 PM  Result Value Ref Range   Lipase 22 11 - 51 U/L    Imaging:  US Abdomen Limited RUQ  Result Date: 12/03/2019 CLINICAL DATA:  Abdominal pain during pregnancy EXAM:  ULTRASOUND ABDOMEN LIMITED RIGHT UPPER QUADRANT COMPARISON:  None. FINDINGS: Gallbladder: Low level echoes, which may reflect sludge. No gallstones or wall thickening visualized. No sonographic Murphy sign noted by sonographer. Common bile duct: Diameter: 3 mm Liver: No focal lesion identified. Within normal limits in parenchymal echogenicity. Portal vein is patent on color Doppler imaging with normal direction of blood flow towards the liver. Other: None. IMPRESSION: Possible gallbladder sludge. Electronically Signed   By: Guadlupe Spanish M.D.   On: 12/03/2019 15:22   MAU Course: Orders Placed This Encounter  Procedures  . US Abdomen Limited RUQ  . Comprehensive metabolic panel  . CBC  . Urinalysis, Routine w reflex microscopic Urine, Clean Catch  . Lipase, blood  . Ambulatory referral to General Surgery  . Soap suds enema  . Discharge patient   Meds ordered this encounter  Medications  . simethicone (MYLICON) chewable tablet 80 mg  . lactated ringers bolus 1,000 mL  . promethazine (PHENERGAN) injection 25 mg  . Doxylamine-Pyridoxine (DICLEGIS) 10-10 MG TBEC    Sig: Take 1-2 tablets by mouth at bedtime. Start by taking two tablets at bedtime on day 1 and 2; if symptoms persist, take 1 tablet in morning and 2 tablets at bedtime on day 3; if symptoms persist, may increase to 1 tablet in morning, 1 tablet mid-afternoon, and 2 tablets at bedtime on day 4 (maximum: doxylamine 40 mg/pyridoxine 40 mg (4 tablets) per day).    Dispense:  60 tablet    Refill:  2    Order Specific Question:   Supervising Provider    Answer:   Reva Bores [[id number]]  . promethazine (PHENERGAN) 12.5 MG tablet    Sig: Take 1 tablet (12.5 mg total) by mouth every 6 (six) hours as needed for nausea or vomiting. Take for breakthrough nausea when diclegis isn't working    Dispense:  30 tablet    Refill:  0    Order Specific Question:   Supervising Provider    Answer:   Reva Bores [[id number]]  . simethicone (GAS-X) 80 MG  chewable tablet    Sig: Chew 1 tablet (80 mg total) by mouth every 6 (six) hours as needed for flatulence.    Dispense:  30 tablet    Refill:  0    Order Specific Question:   Supervising Provider    Answer:   Samara Snide   MDM: Gas and constipation relieved after enema/simethicone Phenergan ordered prior to U/S, pt expressed relief of nausea and was able to keep down sprite Discussed results of U/S and recommended follow up with general surgery to assess her gallbladder.  Ambulatory referral to GS in [place] placed  Assessment: 1. Gallbladder sludge   2. Abdominal pain during pregnancy in second trimester   3. Nausea and vomiting during pregnancy   4. [redacted] weeks gestation of pregnancy    Plan: Discharge home in stable condition with preterm/return precautions.     Follow-up Information    [facility]. Go to.   Why: as scheduled for ongoing prenatal care Contact information: [address] [place] [address] [phone]        [facility]. Schedule an appointment as soon as possible for a visit.   Specialty: General Surgery Why: Call to make an appointment for a gallbladder evaluation Contact information: [address] [place] [address]  [phone]              Allergies as of 12/03/2019   No Known Allergies     Medication List    STOP taking these medications   diphenhydrAMINE 25 mg capsule Commonly known as: Benadryl Allergy   ondansetron 4 MG disintegrating tablet Commonly known as: Zofran ODT   ondansetron 4 MG tablet Commonly known as: Zofran   pantoprazole 40 MG tablet Commonly known as: Protonix     TAKE these medications   Doxylamine-Pyridoxine 10-10 MG Tbec Commonly known as: Diclegis Take 1-2 tablets by mouth at bedtime. Start by taking two tablets at bedtime on day 1 and 2; if symptoms persist, take 1 tablet in morning and 2 tablets at  bedtime on day 3; if symptoms persist, may increase to 1 tablet in morning, 1 tablet mid-afternoon, and 2 tablets at bedtime on day 4 (maximum: doxylamine 40 mg/pyridoxine 40 mg (4 tablets) per day).   multivitamin-prenatal 27-0.8 MG Tabs tablet Take 1 tablet by mouth daily at 12 noon.   promethazine 12.5 MG tablet Commonly known as: PHENERGAN Take 1 tablet (12.5 mg total) by mouth every 6 (six) hours as needed for nausea or vomiting. Take for breakthrough nausea when diclegis isn't working   simethicone 80 MG chewable tablet Commonly known as: Gas-X Chew 1 tablet (80 mg total) by mouth every 6 (six) hours as needed for flatulence.       Edd ArbourJamilla Ronne Stefanski, CNM, MSN, [facility] 12/03/19 5:06 PM

## 2019-12-03 NOTE — MAU Note (Signed)
Been having very bad morning sickness since last wk Wed.  Been having a lot of gas and bloating, "feeling suffocated". Pain in her low back and mid upper abd.  Unable to have bowel movement. (last was 6 days ago) has not taken anything for the constipation.

## 2019-12-05 ENCOUNTER — Other Ambulatory Visit: Payer: Self-pay

## 2019-12-05 ENCOUNTER — Encounter: Payer: Self-pay | Admitting: Advanced Practice Midwife

## 2019-12-05 ENCOUNTER — Ambulatory Visit (INDEPENDENT_AMBULATORY_CARE_PROVIDER_SITE_OTHER): Payer: Medicaid Other | Admitting: Advanced Practice Midwife

## 2019-12-05 VITALS — BP 118/74 | Wt 148.0 lb

## 2019-12-05 DIAGNOSIS — Z3482 Encounter for supervision of other normal pregnancy, second trimester: Secondary | ICD-10-CM

## 2019-12-05 DIAGNOSIS — Z3A17 17 weeks gestation of pregnancy: Secondary | ICD-10-CM

## 2019-12-05 NOTE — Progress Notes (Signed)
No vb. No lof. Went to hospital for severe nausea.

## 2019-12-05 NOTE — Progress Notes (Signed)
  Routine Prenatal Care Visit  Subjective  Breanna Webb is a 34 y.o. [phone] at [redacted]w[redacted]d being seen today for ongoing prenatal care.  She is currently monitored for the following issues for this low-risk pregnancy and has Supervision of other normal pregnancy, antepartum and Trichomonas vaginitis on their problem list.  ----------------------------------------------------------------------------------- Patient reports still having the same symptoms as she had 2 days ago when seen in [place]. She is having nausea and vomiting and general abdominal pain. She is able to keep fluids and some foods down. She has an appointment scheduled with general surgery on the 14th of this month for follow up on gallbladder sludge diagnosed 2 days ago.    . Vag. Bleeding: None.  Movement: Present. Leaking Fluid denies.  ----------------------------------------------------------------------------------- The following portions of the patient's history were reviewed and updated as appropriate: allergies, current medications, past family history, past medical history, past social history, past surgical history and problem list. Problem list updated.  Objective  Blood pressure 118/74, weight 148 lb (67.1 kg), last menstrual period 08/02/2019. Pregravid weight 154 lb (69.9 kg) Total Weight Gain -6 lb (-2.722 kg) Urinalysis: Urine Protein    Urine Glucose    Fetal Status: Fetal Heart Rate (bpm): 152   Movement: Present     General:  Alert, oriented and cooperative. Patient is in no acute distress.  Skin: Skin is warm and dry. No rash noted.   Cardiovascular: Normal heart rate noted  Respiratory: Normal respiratory effort, no problems with respiration noted  Abdomen: Soft, gravid, appropriate for gestational age. Pain/Pressure: Absent     Pelvic:  Cervical exam deferred        Extremities: Normal range of motion.     Mental Status: Normal mood and affect. Normal behavior. Normal judgment and thought content.    Assessment   34 y.o. [id number] at [redacted]w[redacted]d by  05/08/2020, by Last Menstrual Period presenting for routine prenatal visit  Plan    Preterm labor symptoms and general obstetric precautions including but not limited to vaginal bleeding, contractions, leaking of fluid and fetal movement were reviewed in detail with the patient.    Return in about 3 weeks (around 12/26/2019) for anatomy scan and rob.  Tresea Mall, CNM 12/05/2019 4:52 PM

## 2019-12-13 ENCOUNTER — Ambulatory Visit: Payer: Medicaid Other | Admitting: Surgery

## 2019-12-13 ENCOUNTER — Encounter: Payer: Self-pay | Admitting: Surgery

## 2019-12-13 ENCOUNTER — Other Ambulatory Visit: Payer: Self-pay

## 2019-12-13 VITALS — BP 108/74 | HR 89 | Temp 99.4°F | Resp 12 | Ht 60.0 in | Wt 161.0 lb

## 2019-12-13 DIAGNOSIS — R932 Abnormal findings on diagnostic imaging of liver and biliary tract: Secondary | ICD-10-CM | POA: Diagnosis not present

## 2019-12-13 NOTE — Patient Instructions (Addendum)
Start taking a fiber supplement, drink plenty of fluids, and start taking miralax to help with constipation.   Follow up as needed, call the office if you have any questions or concerns.   Fiber Content in Foods  See the following list for the dietary fiber content of some common foods. High-fiber foods High-fiber foods contain 4 grams or more (4g or more) of fiber per serving. They include:  Artichoke (fresh) -- 1 medium has 10.3g of fiber.  Baked beans, plain or vegetarian (canned) --  cup has 5.2g of fiber.  Blackberries or raspberries (fresh) --  cup has 4g of fiber.  Bran cereal --  cup has 8.6g of fiber.  Bulgur (cooked) --  cup has 4g of fiber.  Kidney beans (canned) --  cup has 6.8g of fiber.  Lentils (cooked) --  cup has 7.8g of fiber.  Pear (fresh) -- 1 medium has 5.1g of fiber.  Peas (frozen) --  cup has 4.4g of fiber.  Pinto beans (canned) --  cup has 5.5g of fiber.  Pinto beans (dried and cooked) --  cup has 7.7g of fiber.  Potato with skin (baked) -- 1 medium has 4.4g of fiber.  Quinoa (cooked) --  cup has 5g of fiber.  Soybeans (canned, frozen, or fresh) --  cup has 5.1g of fiber. Moderate-fiber foods Moderate-fiber foods contain 1-4 grams (1-4g) of fiber per serving. They include:  Almonds -- 1 oz. has 3.5g of fiber.  Apple with skin -- 1 medium has 3.3g of fiber.  Applesauce, sweetened --  cup has 1.5g of fiber.  Bagel, plain -- one 4-inch (10-cm) bagel has 2g of fiber.  Banana -- 1 medium has 3.1g of fiber.  Broccoli (cooked) --  cup has 2.5g of fiber.  Carrots (cooked) --  cup has 2.3g of fiber.  Corn (canned or frozen) --  cup has 2.1g of fiber.  Corn tortilla -- one 6-inch (15-cm) tortilla has 1.5g of fiber.  Green beans (canned) --  cup has 2g of fiber.  Instant oatmeal --  cup has about 2g of fiber.  Long-grain brown rice (cooked) -- 1 cup has 3.5g of fiber.  Macaroni, enriched (cooked) -- 1 cup has 2.5g of  fiber.  Melon -- 1 cup has 1.4g of fiber.  Multigrain cereal --  cup has about 2-4g of fiber.  Orange -- 1 small has 3.1g of fiber.  Potatoes, mashed --  cup has 1.6g of fiber.  Raisins -- 1/4 cup has 1.6g of fiber.  Squash --  cup has 2.9g of fiber.  Sunflower seeds --  cup has 1.1g of fiber.  Tomato -- 1 medium has 1.5g of fiber.  Vegetable or soy patty -- 1 has 3.4g of fiber.  Whole-wheat bread -- 1 slice has 2g of fiber.  Whole-wheat spaghetti --  cup has 3.2g of fiber. Low-fiber foods Low-fiber foods contain less than 1 gram (less than 1g) of fiber per serving. They include:  Egg -- 1 large.  Flour tortilla -- one 6-inch (15-cm) tortilla.  Fruit juice --  cup.  Lettuce -- 1 cup.  Meat, poultry, or fish -- 1 oz.  Milk -- 1 cup.  Spinach (raw) -- 1 cup.  White bread -- 1 slice.  White rice --  cup.  Yogurt --  cup. Actual amounts of fiber in foods may be different depending on processing. Talk with your dietitian about how much fiber you need in your diet. This information is not intended to replace advice  given to you by your health care provider. Make sure you discuss any questions you have with your health care provider. Document Revised: 10/09/2015 Document Reviewed: 04/10/2015 Elsevier Patient Education  2020 [employer].

## 2019-12-13 NOTE — Progress Notes (Signed)
Patient ID: Breanna Webb, female   DOB: 01-11-1986, 34 y.o.   MRN: [id number]  Chief Complaint: 17-week pregnancy, nausea and vomiting, possible sludge on gallbladder ultrasound.  History of Present Illness Breanna Webb is a 34 y.o. female with an ER visit back on October 4 after her presumed morning sickness extended into the afternoon with persistent nausea.  She also has a history of constipation.  She reports her appetite has since improved, she denies any fevers, chills, nausea or vomiting today.  She reports her bowels are moving better as well.  She denies any epigastric or right upper quadrant or abdominal pain whatsoever.  This is her fifth pregnancy, so it is presumed that morning sickness is unknown likely in her.  With her ultrasound findings are clearly not well defined, as the likely etiology here.  Past Medical History Past Medical History:  Diagnosis Date  . Anemia    Patient reports anemia with her pregnancies  . Infection    UTI  . Miscarriage   . Vaginal Pap smear, abnormal       Past Surgical History:  Procedure Laterality Date  . NO PAST SURGERIES      No Known Allergies  Current Outpatient Medications  Medication Sig Dispense Refill  . Doxylamine-Pyridoxine (DICLEGIS) 10-10 MG TBEC Take 1-2 tablets by mouth at bedtime. Start by taking two tablets at bedtime on day 1 and 2; if symptoms persist, take 1 tablet in morning and 2 tablets at bedtime on day 3; if symptoms persist, may increase to 1 tablet in morning, 1 tablet mid-afternoon, and 2 tablets at bedtime on day 4 (maximum: doxylamine 40 mg/pyridoxine 40 mg (4 tablets) per day). (Patient not taking: Reported on 12/13/2019) 60 tablet 2  . Prenatal Vit-Fe Fumarate-FA (MULTIVITAMIN-PRENATAL) 27-0.8 MG TABS tablet Take 1 tablet by mouth daily at 12 noon. (Patient not taking: Reported on 12/13/2019)    . promethazine (PHENERGAN) 12.5 MG tablet Take 1 tablet (12.5 mg total) by mouth every 6 (six) hours as needed  for nausea or vomiting. Take for breakthrough nausea when diclegis isn't working (Patient not taking: Reported on 12/13/2019) 30 tablet 0  . simethicone (GAS-X) 80 MG chewable tablet Chew 1 tablet (80 mg total) by mouth every 6 (six) hours as needed for flatulence. (Patient not taking: Reported on 12/13/2019) 30 tablet 0   No current facility-administered medications for this visit.    Family History Family History  Problem Relation Age of Onset  . Healthy Mother   . Healthy Father       Social History Social History   Tobacco Use  . Smoking status: Former Games developer  . Smokeless tobacco: Never Used  . Tobacco comment: quit 2015  Vaping Use  . Vaping Use: Never used  Substance Use Topics  . Alcohol use: Not Currently  . Drug use: Not Currently    Types: Marijuana    Comment: smoking when zofran not working,  last 10/3        Review of Systems  Constitutional: Negative for chills, fever and weight loss.  HENT: Negative.   Eyes: Negative.   Respiratory: Negative.   Cardiovascular: Negative.   Gastrointestinal: Positive for constipation, nausea and vomiting. Negative for abdominal pain, blood in stool, diarrhea, heartburn and melena.  Genitourinary: Negative.   Skin: Negative.   Neurological: Negative.   Psychiatric/Behavioral: Negative.       Physical Exam Blood pressure 108/74, pulse 89, temperature 99.4 F (37.4 C), resp. rate 12, height 5' (1.524 m), weight  161 lb (73 kg), last menstrual period 08/02/2019, SpO2 97 %. Last Weight  Most recent update: 12/13/2019 10:25 AM   Weight  73 kg (161 lb)            CONSTITUTIONAL: Well developed, and nourished, appropriately responsive and aware without distress.   EYES: Sclera non-icteric.   EARS, NOSE, MOUTH AND THROAT: Mask worn.    Hearing is intact to voice.  NECK: Trachea is midline, and there is no jugular venous distension.  LYMPH NODES:  Lymph nodes in the neck are not enlarged. RESPIRATORY:  Lungs are clear,  and breath sounds are equal bilaterally. Normal respiratory effort without pathologic use of accessory muscles. CARDIOVASCULAR: Heart is regular in rate and rhythm. GI: The abdomen is soft, nontender, and nondistended, c/w IUP. There were no palpable masses.  There were normal bowel sounds. MUSCULOSKELETAL:  Symmetrical muscle tone appreciated in all four extremities.    SKIN: Skin turgor is normal. No pathologic skin lesions appreciated.  NEUROLOGIC:  Motor and sensation appear grossly normal.  Cranial nerves are grossly without defect. PSYCH:  Alert and oriented to person, place and time. Affect is appropriate for situation.  Data Reviewed I have personally reviewed what is currently available of the patient's imaging, recent labs and medical records.   Labs:  CBC Latest Ref Rng & Units 12/03/2019 11/07/2019 09/18/2019  WBC 4.0 - 10.5 K/uL 14.5(H) 12.1(H) 20.9(H)  Hemoglobin 12.0 - 15.0 g/dL 10.8(L) 10.2(L) 12.0  Hematocrit 36 - 46 % 31.7(L) 30.9(L) 35.4(L)  Platelets 150 - 400 K/uL 259 242 219   CMP Latest Ref Rng & Units 12/03/2019 09/18/2019 11/09/2018  Glucose 70 - 99 mg/dL 315(Q) 008(Q) 761(P)  BUN 6 - 20 mg/dL <5(K) 6 10  Creatinine 0.44 - 1.00 mg/dL 9.32 6.71 2.45  Sodium 135 - 145 mmol/L 133(L) 137 139  Potassium 3.5 - 5.1 mmol/L 3.2(L) 3.7 3.1(L)  Chloride 98 - 111 mmol/L 97(L) 107 100  CO2 22 - 32 mmol/L 23 20(L) 29  Calcium 8.9 - 10.3 mg/dL 9.5 9.4 9.5  Total Protein 6.5 - 8.1 g/dL 7.2 7.8 8.0(D)  Total Bilirubin 0.3 - 1.2 mg/dL 2.1(H) 1.2 1.2  Alkaline Phos 38 - 126 U/L 47 49 61  AST 15 - 41 U/L 55(H) 16 25  ALT 0 - 44 U/L 76(H) 14 30     Imaging:CLINICAL DATA:  Abdominal pain during pregnancy  EXAM: ULTRASOUND ABDOMEN LIMITED RIGHT UPPER QUADRANT  COMPARISON:  None.  FINDINGS: Gallbladder:  Low level echoes, which may reflect sludge. No gallstones or wall thickening visualized. No sonographic Murphy sign noted by sonographer.  Common bile  duct:  Diameter: 3 mm  Liver:  No focal lesion identified. Within normal limits in parenchymal echogenicity. Portal vein is patent on color Doppler imaging with normal direction of blood flow towards the liver.  Other: None.  IMPRESSION: Possible gallbladder sludge.   Electronically Signed   By: Guadlupe Spanish M.D.   On: 12/03/2019 15:22 Within last 24 hrs: No results found.  Assessment    Possible gallbladder sludge, unlikely degree of cholecystitis.  Nothing conclusive based on history or exam.  Currently with an second trimester pregnancy. Patient Active Problem List   Diagnosis Date Noted  . Trichomonas vaginitis 10/29/2019  . Supervision of other normal pregnancy, antepartum 10/15/2019    Plan    We discussed the things to avoid should this be a degree of symptomatic cholecystitis.  I believe she understands that now and will assume it is  and avoid the things that might provoke it.  We also made her aware that the best time for any kind of operation would be before her third trimester.  And even better would be after her delivery of her child.  She is motivated and desires to deliver her child without complication or the risk of an operation in the interim. She has medications to help her with the nausea.  And she is currently doing well.  So we will see if any further evaluation of her possible biliary disease can wait until after delivery of her current pregnancy. We asked her to keep [place] in the loop.  Face-to-face time spent with the patient and accompanying care providers(if present) was 30 minutes, with more than 50% of the time spent counseling, educating, and coordinating care of the patient.      Campbell Lerner M.D., FACS 12/13/2019, 11:02 AM

## 2019-12-26 ENCOUNTER — Ambulatory Visit (INDEPENDENT_AMBULATORY_CARE_PROVIDER_SITE_OTHER): Payer: Medicaid Other | Admitting: Advanced Practice Midwife

## 2019-12-26 ENCOUNTER — Encounter: Payer: Self-pay | Admitting: Advanced Practice Midwife

## 2019-12-26 ENCOUNTER — Other Ambulatory Visit: Payer: Self-pay

## 2019-12-26 ENCOUNTER — Ambulatory Visit (INDEPENDENT_AMBULATORY_CARE_PROVIDER_SITE_OTHER): Payer: Medicaid Other

## 2019-12-26 VITALS — BP 110/74 | Wt 159.0 lb

## 2019-12-26 DIAGNOSIS — Z3A19 19 weeks gestation of pregnancy: Secondary | ICD-10-CM

## 2019-12-26 DIAGNOSIS — Z3482 Encounter for supervision of other normal pregnancy, second trimester: Secondary | ICD-10-CM

## 2019-12-26 DIAGNOSIS — Z3A20 20 weeks gestation of pregnancy: Secondary | ICD-10-CM

## 2019-12-26 NOTE — Progress Notes (Signed)
Routine Prenatal Care Visit  Subjective  Breanna Webb is a 34 y.o. [phone] at [redacted]w[redacted]d being seen today for ongoing prenatal care.  She is currently monitored for the following issues for this low-risk pregnancy and has Supervision of other normal pregnancy, antepartum; Trichomonas vaginitis; and Abnormal gallbladder ultrasound on their problem list.  ----------------------------------------------------------------------------------- Patient reports no complaints. We discussed anatomy scan report and follow up scan at next visit.   . Vag. Bleeding: None.  Movement: Present. Leaking Fluid denies.  ----------------------------------------------------------------------------------- The following portions of the patient's history were reviewed and updated as appropriate: allergies, current medications, past family history, past medical history, past social history, past surgical history and problem list. Problem list updated.  Objective  Blood pressure 110/74, weight 159 lb (72.1 kg), last menstrual period 08/02/2019. Pregravid weight 154 lb (69.9 kg) Total Weight Gain 5 lb (2.268 kg) Urinalysis: Urine Protein    Urine Glucose    Fetal Status: Fetal Heart Rate (bpm): 148 Fundal Height: 20 cm Movement: Present      Anatomy scan: incomplete for fetal spine and normal, breech, gender surprise, placenta anterior fundal  General:  Alert, oriented and cooperative. Patient is in no acute distress.  Skin: Skin is warm and dry. No rash noted.   Cardiovascular: Normal heart rate noted  Respiratory: Normal respiratory effort, no problems with respiration noted  Abdomen: Soft, gravid, appropriate for gestational age. Pain/Pressure: Absent     Pelvic:  Cervical exam deferred        Extremities: Normal range of motion.  Edema: None  Mental Status: Normal mood and affect. Normal behavior. Normal judgment and thought content.   Assessment   34 y.o. [id number] at 108w6d by  05/08/2020, by Last Menstrual  Period presenting for routine prenatal visit  Plan   pregnancy Problems (from 10/15/19 to present)    Problem Noted Resolved   Supervision of other normal pregnancy, antepartum 10/15/2019 by Mirna Mires, CNM No   Overview Addendum 11/26/2019  9:39 AM by Mirna Mires, CNM     Clinic [facility] Prenatal Labs  Dating IUP, [place] confirms Blood type:     Genetic Screen  NIPS: Inheritest neg Antibody:   Anatomic [place]  Rubella:    GTT      Third trimester:  RPR:     Flu vaccine  HBsAg:     TDaP vaccine                                               Rhogam: HIV:     Baby Food                undecided                               GBS: (For PCN allergy, check sensitivities)  Contraception  Pap:    NILM with high Risk HPV  Circumcision    Pediatrician    Support Person              Previous Version       Preterm labor symptoms and general obstetric precautions including but not limited to vaginal bleeding, contractions, leaking of fluid and fetal movement were reviewed in detail with the patient.   Return in about 4 weeks (around 01/23/2020) for follow up anatomy scan and rob.  Tresea Mall, CNM 12/26/2019 4:30 PM

## 2019-12-26 NOTE — Progress Notes (Signed)
U/s today. No vb. No lof.  

## 2020-01-16 ENCOUNTER — Other Ambulatory Visit: Payer: Self-pay | Admitting: Obstetrics

## 2020-01-16 ENCOUNTER — Encounter: Payer: Self-pay | Admitting: Obstetrics

## 2020-01-16 ENCOUNTER — Other Ambulatory Visit: Payer: Self-pay

## 2020-01-16 ENCOUNTER — Observation Stay
Admission: EM | Admit: 2020-01-16 | Discharge: 2020-01-16 | Disposition: A | Discharge status: Home / Self Care | Payer: Medicaid Other | Attending: Obstetrics | Admitting: Obstetrics

## 2020-01-16 DIAGNOSIS — O2342 Unspecified infection of urinary tract in pregnancy, second trimester: Principal | ICD-10-CM | POA: Insufficient documentation

## 2020-01-16 DIAGNOSIS — Z3A23 23 weeks gestation of pregnancy: Secondary | ICD-10-CM | POA: Diagnosis not present

## 2020-01-16 DIAGNOSIS — R1010 Upper abdominal pain, unspecified: Secondary | ICD-10-CM

## 2020-01-16 DIAGNOSIS — R109 Unspecified abdominal pain: Secondary | ICD-10-CM | POA: Diagnosis present

## 2020-01-16 DIAGNOSIS — O26892 Other specified pregnancy related conditions, second trimester: Secondary | ICD-10-CM | POA: Diagnosis present

## 2020-01-16 DIAGNOSIS — O26893 Other specified pregnancy related conditions, third trimester: Secondary | ICD-10-CM | POA: Diagnosis present

## 2020-01-16 DIAGNOSIS — Z3480 Encounter for supervision of other normal pregnancy, unspecified trimester: Secondary | ICD-10-CM

## 2020-01-16 DIAGNOSIS — O219 Vomiting of pregnancy, unspecified: Secondary | ICD-10-CM | POA: Insufficient documentation

## 2020-01-16 DIAGNOSIS — Z87891 Personal history of nicotine dependence: Secondary | ICD-10-CM | POA: Diagnosis not present

## 2020-01-16 DIAGNOSIS — E876 Hypokalemia: Secondary | ICD-10-CM

## 2020-01-16 LAB — CBC
HCT: 25.4 % — ABNORMAL LOW (ref 36.0–46.0)
Hemoglobin: 8.6 g/dL — ABNORMAL LOW (ref 12.0–15.0)
MCH: 21.2 pg — ABNORMAL LOW (ref 26.0–34.0)
MCHC: 33.9 g/dL (ref 30.0–36.0)
MCV: 62.6 fL — ABNORMAL LOW (ref 80.0–100.0)
Platelets: 202 10*3/uL (ref 150–400)
RBC: 4.06 MIL/uL (ref 3.87–5.11)
RDW: 15.4 % (ref 11.5–15.5)
WBC: 16.4 10*3/uL — ABNORMAL HIGH (ref 4.0–10.5)
nRBC: 0 % (ref 0.0–0.2)

## 2020-01-16 LAB — COMPREHENSIVE METABOLIC PANEL
ALT: 11 U/L (ref 0–44)
AST: 13 U/L — ABNORMAL LOW (ref 15–41)
Albumin: 2.8 g/dL — ABNORMAL LOW (ref 3.5–5.0)
Alkaline Phosphatase: 73 U/L (ref 38–126)
Anion gap: 13 (ref 5–15)
BUN: 5 mg/dL — ABNORMAL LOW (ref 6–20)
CO2: 22 mmol/L (ref 22–32)
Calcium: 8.9 mg/dL (ref 8.9–10.3)
Chloride: 96 mmol/L — ABNORMAL LOW (ref 98–111)
Creatinine, Ser: 0.34 mg/dL — ABNORMAL LOW (ref 0.44–1.00)
GFR, Estimated: >60 mL/min (ref >60)
Glucose, Bld: 111 mg/dL — ABNORMAL HIGH (ref 70–99)
Potassium: 2.9 mmol/L — ABNORMAL LOW (ref 3.5–5.1)
Sodium: 131 mmol/L — ABNORMAL LOW (ref 135–145)
Total Bilirubin: 1.4 mg/dL — ABNORMAL HIGH (ref 0.3–1.2)
Total Protein: 6.9 g/dL (ref 6.5–8.1)

## 2020-01-16 LAB — URINALYSIS, ROUTINE W REFLEX MICROSCOPIC
Bilirubin Urine: NEGATIVE
Glucose, UA: NEGATIVE mg/dL
Ketones, ur: 80 mg/dL — AB
Nitrite: NEGATIVE
Protein, ur: 100 mg/dL — AB
Specific Gravity, Urine: 1.024 (ref 1.005–1.030)
WBC, UA: >50 WBC/hpf — ABNORMAL HIGH (ref 0–5)
pH: 6 (ref 5.0–8.0)

## 2020-01-16 LAB — GAMMA GT: GGT: 25 U/L (ref 7–50)

## 2020-01-16 MED ORDER — NITROFURANTOIN MONOHYD MACRO 100 MG PO CAPS: 100.0000 mg | ORAL_CAPSULE | Freq: Two times a day (BID) | ORAL | 1 refills | Status: DC

## 2020-01-16 MED ORDER — ONDANSETRON HCL 4 MG/2ML IJ SOLN
INTRAMUSCULAR | Status: AC
  Filled 2020-01-16: qty 2

## 2020-01-16 MED ORDER — POTASSIUM & SODIUM PHOSPHATES 280-160-250 MG PO PACK
1.0000 | PACK | Freq: Three times a day (TID) | ORAL | Status: AC
  Administered 2020-01-16: 1 via ORAL
  Filled 2020-01-16: qty 1

## 2020-01-16 MED ORDER — LACTATED RINGERS IV BOLUS
500.0000 mL | Freq: Once | INTRAVENOUS | Status: AC
  Administered 2020-01-16: 500 mL via INTRAVENOUS

## 2020-01-16 MED ORDER — POTASSIUM CHLORIDE CRYS ER 20 MEQ PO TBCR: 40.0000 meq | EXTENDED_RELEASE_TABLET | Freq: Every day | ORAL | 0 refills | Status: DC

## 2020-01-16 MED ORDER — ONDANSETRON HCL 4 MG/2ML IJ SOLN
4.0000 mg | Freq: Once | INTRAMUSCULAR | Status: AC
  Administered 2020-01-16: 4 mg via INTRAVENOUS

## 2020-01-16 NOTE — OB Triage Note (Signed)
Pt. Present to L&D today with complaints of sharp and cramping abdominal pain. She is G5P3 and [redacted]w[redacted]d. Denies any fluid leaking. Pt. States that she is having scant discharge that is white and clear. Feels baby moving.

## 2020-01-16 NOTE — Final Progress Note (Signed)
Final Progress Note  Patient ID: Breanna Webb MRN: [id number] DOB/AGE: [birth year] 34 y.o.  Admit date: 01/16/2020 Admitting provider: Conard Novak, MD Discharge date: 01/16/2020   Admission Diagnoses:  Abdominal pain in pregnancy [redacted] weeks gestation of pregnancy  Discharge Diagnoses:  Active Problems:   Abdominal pain in pregnancy, second trimester  Urinary tract infection Gallbladder pain  History of Present Illness: The patient is a 34 y.o. female [id number]  who presents for evaluation of abdominal pain at 23 weeks 6 days gestation of pregnancy. This abdominal pain started 4-5 days ago with intermittent epigastric pain. She adds that she has also continued having occasional nausea and vomiting. In October, She was seen at [facility] and diagnosed with likely cholecystitis. She has been seen outpatient by a general surgeon. On Friday evening, she ate a meal of curry noodles, and th colicky pain started therafter.  In addition, she adds that she has begun having urinary urgency with limited urinary output, and mild dysuria..  She reports ample fetal movement, denies any vaginal bleeding and denies contractions. She went to work today,  But began feeling more regular and sharper abdominal pain.  She admits to not taking any PNVs,, and has been anemic. Her only food today has been a [employer] bar and limited fluids.  Past Medical History:  Diagnosis Date  . Anemia    Patient reports anemia with her pregnancies  . Infection    UTI  . Miscarriage   . Vaginal Pap smear, abnormal     Past Surgical History:  Procedure Laterality Date  . NO PAST SURGERIES      No current facility-administered medications on file prior to encounter.   Current Outpatient Medications on File Prior to Encounter  Medication Sig Dispense Refill  . Doxylamine-Pyridoxine (DICLEGIS) 10-10 MG TBEC Take 1-2 tablets by mouth at bedtime. Start by taking two tablets at bedtime on day 1 and 2; if symptoms persist,  take 1 tablet in morning and 2 tablets at bedtime on day 3; if symptoms persist, may increase to 1 tablet in morning, 1 tablet mid-afternoon, and 2 tablets at bedtime on day 4 (maximum: doxylamine 40 mg/pyridoxine 40 mg (4 tablets) per day). 60 tablet 2  . Prenatal Vit-Fe Fumarate-FA (MULTIVITAMIN-PRENATAL) 27-0.8 MG TABS tablet Take 1 tablet by mouth daily at 12 noon.     . promethazine (PHENERGAN) 12.5 MG tablet Take 1 tablet (12.5 mg total) by mouth every 6 (six) hours as needed for nausea or vomiting. Take for breakthrough nausea when diclegis isn't working 30 tablet 0  . simethicone (GAS-X) 80 MG chewable tablet Chew 1 tablet (80 mg total) by mouth every 6 (six) hours as needed for flatulence. 30 tablet 0    No Known Allergies  Social History   Socioeconomic History  . Marital status: Single    Spouse name: Not on file  . Number of children: 3  . Years of education: Not on file  . Highest education level: Not on file  Occupational History  . Not on file  Tobacco Use  . Smoking status: Former Games developer  . Smokeless tobacco: Never Used  . Tobacco comment: quit 2015  Vaping Use  . Vaping Use: Never used  Substance and Sexual Activity  . Alcohol use: Not Currently    Comment: 2-3 per week when not pregnant  . Drug use: Not Currently    Types: Marijuana    Comment: smoking when zofran not working,  last 10/3  . Sexual activity: Yes  Birth control/protection: None  Other Topics Concern  . Not on file  Social History Narrative  . Not on file   Social Determinants of Health   Financial Resource Strain:   . Difficulty of Paying Living Expenses: Not on file  Food Insecurity:   . Worried About Programme researcher, broadcasting/film/video in the Last Year: Not on file  . Ran Out of Food in the Last Year: Not on file  Transportation Needs:   . Lack of Transportation (Medical): Not on file  . Lack of Transportation (Non-Medical): Not on file  Physical Activity:   . Days of Exercise per Week: Not on file   . Minutes of Exercise per Session: Not on file  Stress:   . Feeling of Stress : Not on file  Social Connections:   . Frequency of Communication with Friends and Family: Not on file  . Frequency of Social Gatherings with Friends and Family: Not on file  . Attends Religious Services: Not on file  . Active Member of Clubs or Organizations: Not on file  . Attends Banker Meetings: Not on file  . Marital Status: Not on file  Intimate Partner Violence: Not At Risk  . Fear of Current or Ex-Partner: No  . Emotionally Abused: No  . Physically Abused: No  . Sexually Abused: No    Family History  Problem Relation Age of Onset  . Healthy Mother   . Healthy Father      Review of Systems  Constitutional: Negative.   HENT: Negative.   Eyes: Negative.   Respiratory: Negative.   Cardiovascular: Negative.   Gastrointestinal: Positive for abdominal pain, constipation, nausea and vomiting.  Genitourinary: Positive for dysuria and urgency.  Musculoskeletal: Negative.   Skin: Negative.   Neurological: Negative.   Endo/Heme/Allergies: Negative.   Psychiatric/Behavioral: Negative.      Physical Exam: BP 113/77 (BP Location: Right Arm)   Pulse 98   Temp 98.8 F (37.1 C) (Oral)   Resp 18   Ht 5' (1.524 m)   Wt 74.8 kg   LMP 08/02/2019   SpO2 96%   BMI 32.22 kg/m   Physical Exam Constitutional:      Appearance: Normal appearance.  Genitourinary:     Genitourinary Comments: Reassuring FHTS 140-150. No contractions per toco. Vaginal exam deferred. Gravid uterus  HENT:     Head: Normocephalic.     Nose: Nose normal.  Eyes:     Extraocular Movements: Extraocular movements intact.     Pupils: Pupils are equal, round, and reactive to light.  Cardiovascular:     Rate and Rhythm: Normal rate and regular rhythm.     Pulses: Normal pulses.     Heart sounds: Normal heart sounds.  Pulmonary:     Effort: Pulmonary effort is normal.     Breath sounds: Normal breath sounds.   Abdominal:     General: Bowel sounds are normal.     Palpations: Abdomen is soft.     Tenderness: There is abdominal tenderness.     Comments: No abdominal pain with gentle palpation; slight tenderness felt in RUQ with deeper palpation. + Bowel sounds all quadrants.  One episode of vomiting during this admission.  Musculoskeletal:        General: Normal range of motion.     Cervical back: Normal range of motion and neck supple.  Neurological:     General: No focal deficit present.     Mental Status: She is alert and oriented to person,  place, and time.  Skin:    General: Skin is warm and dry.  Psychiatric:        Mood and Affect: Mood normal.        Behavior: Behavior normal.     Consults: None  Significant Findings/ Diagnostic Studies:  Lab Orders     Urinalysis, Routine w reflex microscopic Urine, Clean Catch     Comprehensive metabolic panel     CBC     Gamma GT  Procedures: EFM x 30 minutes. Fetus is preterm- NST not applicable. FHTs WNL and reassuring  Tocometry: no contractions noted or palpated   Hospital Course: The patient was admitted to Labor and Delivery Triage for observation. After evaluating FHTS at this early gestation, the EFM was removed.  A urine sample was sent and UA indicated  UTI. Additional bloodwork reveals: anemia, low potassium; improvement in her AST and ALT compared to her last inpatient visit.  She was provided oral potassium for her lower level, and an IV was started- She was rehydrated with an IV bolus.She was given IV Zofran for N and V.  Her abdominal pain diminished after receiving IV fluids and Zofran.  She is discharged home with prescriptions for additional potassium, and Macrobid for her UTI. An outpatient ultrasound is also ordered for her to evaluate the status of her gallbladder. Follow up is also planned with [facility]. She is encouraged to increase her intake of iron rich foods, start taking her prenatal vitamins, and follow up  with the general surgeon  as he had advised her for any worsening gallbladder symptoms.  Discharge Condition: good  Disposition:  Discharge disposition: 01-Home or Self Care       Diet: Encourage fluids and Advance diet carefully; avoid fatty, spicy foods.  Discharge Activity: Activity as tolerated  Discharge Instructions    Notify physician for a general feeling that "something is not right"   Complete by: As directed    Notify physician for increase or change in vaginal discharge   Complete by: As directed    Notify physician for intestinal cramps, with or without diarrhea, sometimes described as "gas pain"   Complete by: As directed    Notify physician for leaking of fluid   Complete by: As directed    Notify physician for low, dull backache, unrelieved by heat or Tylenol   Complete by: As directed    Notify physician for menstrual like cramps   Complete by: As directed    Notify physician for pelvic pressure   Complete by: As directed    Notify physician for uterine contractions.  These may be painless and feel like the uterus is tightening or the baby is  "balling up"   Complete by: As directed    Notify physician for vaginal bleeding   Complete by: As directed    PRETERM LABOR:  Includes any of the follwing symptoms that occur between 20 - [redacted] weeks gestation.  If these symptoms are not stopped, preterm labor can result in preterm delivery, placing your baby at risk   Complete by: As directed      Allergies as of 01/16/2020   No Known Allergies     Medication List    TAKE these medications   Doxylamine-Pyridoxine 10-10 MG Tbec Commonly known as: Diclegis Take 1-2 tablets by mouth at bedtime. Start by taking two tablets at bedtime on day 1 and 2; if symptoms persist, take 1 tablet in morning and 2 tablets at bedtime on  day 3; if symptoms persist, may increase to 1 tablet in morning, 1 tablet mid-afternoon, and 2 tablets at bedtime on day 4 (maximum: doxylamine 40  mg/pyridoxine 40 mg (4 tablets) per day).   multivitamin-prenatal 27-0.8 MG Tabs tablet Take 1 tablet by mouth daily at 12 noon.   nitrofurantoin (macrocrystal-monohydrate) 100 MG capsule Commonly known as: MACROBID Take 1 capsule (100 mg total) by mouth 2 (two) times daily.   potassium chloride SA 20 MEQ tablet Commonly known as: KLOR-CON Take 2 tablets (40 mEq total) by mouth daily for 2 days.   promethazine 12.5 MG tablet Commonly known as: PHENERGAN Take 1 tablet (12.5 mg total) by mouth every 6 (six) hours as needed for nausea or vomiting. Take for breakthrough nausea when diclegis isn't working   simethicone 80 MG chewable tablet Commonly known as: Gas-X Chew 1 tablet (80 mg total) by mouth every 6 (six) hours as needed for flatulence.        Total time spent taking care of this patient: 50 minutes with evaluation, examination, and documentation.  Signed: Mirna Mires, CNM  01/16/2020, 8:51 PM

## 2020-01-16 NOTE — Discharge Summary (Addendum)
Please see Final Progress Note Mirna Mires, CNM  01/16/2020 8:03 PM

## 2020-01-22 ENCOUNTER — Ambulatory Visit: Payer: Medicaid Other

## 2020-01-23 ENCOUNTER — Ambulatory Visit (INDEPENDENT_AMBULATORY_CARE_PROVIDER_SITE_OTHER): Payer: Medicaid Other

## 2020-01-23 ENCOUNTER — Other Ambulatory Visit: Payer: Self-pay

## 2020-01-23 ENCOUNTER — Encounter: Payer: Self-pay | Admitting: Obstetrics

## 2020-01-23 ENCOUNTER — Ambulatory Visit (INDEPENDENT_AMBULATORY_CARE_PROVIDER_SITE_OTHER): Payer: Medicaid Other | Admitting: Obstetrics

## 2020-01-23 VITALS — BP 114/70 | Ht 60.0 in | Wt 155.2 lb

## 2020-01-23 DIAGNOSIS — Z113 Encounter for screening for infections with a predominantly sexual mode of transmission: Secondary | ICD-10-CM

## 2020-01-23 DIAGNOSIS — Z3482 Encounter for supervision of other normal pregnancy, second trimester: Secondary | ICD-10-CM

## 2020-01-23 DIAGNOSIS — Z3A24 24 weeks gestation of pregnancy: Secondary | ICD-10-CM

## 2020-01-23 NOTE — Progress Notes (Signed)
ANATOMY SCAN AND ROB

## 2020-01-23 NOTE — Progress Notes (Signed)
Routine Prenatal Care Visit  Subjective  Breanna Webb is a 34 y.o. [phone] at [redacted]w[redacted]d being seen today for ongoing prenatal care.  She is currently monitored for the following issues for this low-risk pregnancy and has Supervision of other normal pregnancy, antepartum; Trichomonas vaginitis; Abnormal gallbladder ultrasound; and Abdominal pain in pregnancy, second trimester on their problem list.  ----------------------------------------------------------------------------------- Patient reports no complaints.  Patient denies any additional abdominal pain since presentation to [facility] triage for evaluation.  . Vag. Bleeding: None.  Movement: Present. Denies leaking of fluid.  ----------------------------------------------------------------------------------- The following portions of the patient's history were reviewed and updated as appropriate: allergies, current medications, past family history, past medical history, past social history, past surgical history and problem list. Problem list updated.   Objective  Blood pressure 114/70, height 5' (1.524 m), weight 155 lb 3.2 oz (70.4 kg), last menstrual period 08/02/2019. Pregravid weight 154 lb (69.9 kg) Total Weight Gain 1 lb 3.2 oz (0.544 kg) Urinalysis:      Fetal Status: Fetal Heart Rate (bpm): 155   Movement: Present     General:  Alert, oriented and cooperative. Patient is in no acute distress.  Skin: Skin is warm and dry. No rash noted.   Cardiovascular: Normal heart rate noted  Respiratory: Normal respiratory effort, no problems with respiration noted  Abdomen: Soft, gravid, appropriate for gestational age. Pain/Pressure: Absent     Pelvic:  Cervical exam deferred        Extremities: Normal range of motion.  Edema: None  ental Status: Normal mood and affect. Normal behavior. Normal judgment and thought content.     Assessment   34 y.o. [id number] at [redacted]w[redacted]d by  05/08/2020, by Last Menstrual Period presenting for routine prenatal  visit  Plan   pregnancy Problems (from 10/15/19 to present)    Problem Noted Resolved   Supervision of other normal pregnancy, antepartum 10/15/2019 by Mirna Mires, CNM No   Overview Addendum 01/23/2020  4:25 PM by Zipporah Plants, CNM     Clinic [facility] Prenatal Labs  Dating IUP, [place] confirms Blood type: B/Positive/-- (09/08 1505)   Genetic Screen  NIPS: Inheritest neg Antibody:Negative (09/08 1505)  Anatomic [place]  Rubella: 2.77 (09/08 1505)  GTT      Third trimester:  RPR: Non Reactive (09/08 1505)   Flu vaccine  HBsAg: Negative (09/08 1505)   TDaP vaccine                                               Rhogam: HIV: Non Reactive (09/08 1505)   Baby Food                undecided                               GBS: (For PCN allergy, check sensitivities)  Contraception  Pap:    NILM with high Risk HPV  Circumcision    Pediatrician    Support Person              Previous Version       Gestational age appropriate obstetric precautions including but not limited to vaginal bleeding, contractions, leaking of fluid and fetal movement were reviewed in detail with the patient.    Return in about 4 weeks (around 02/20/2020) for ROB and 1h  GTT.  Zipporah Plants, CNM, MSN [facility], [facility] 01/23/2020, 4:27 PM

## 2020-01-28 ENCOUNTER — Ambulatory Visit
Admission: RE | Admit: 2020-01-28 | Discharge: 2020-01-28 | Disposition: A | Discharge status: Home / Self Care | Payer: Medicaid Other | Source: Ambulatory Visit | Attending: Obstetrics | Admitting: Obstetrics

## 2020-01-28 ENCOUNTER — Other Ambulatory Visit: Payer: Self-pay

## 2020-01-28 DIAGNOSIS — R109 Unspecified abdominal pain: Secondary | ICD-10-CM | POA: Diagnosis not present

## 2020-01-28 DIAGNOSIS — R1010 Upper abdominal pain, unspecified: Secondary | ICD-10-CM | POA: Diagnosis present

## 2020-01-28 DIAGNOSIS — Z3480 Encounter for supervision of other normal pregnancy, unspecified trimester: Secondary | ICD-10-CM | POA: Diagnosis not present

## 2020-02-25 ENCOUNTER — Ambulatory Visit (INDEPENDENT_AMBULATORY_CARE_PROVIDER_SITE_OTHER): Payer: Medicaid Other | Admitting: Obstetrics

## 2020-02-25 ENCOUNTER — Other Ambulatory Visit: Payer: Self-pay

## 2020-02-25 ENCOUNTER — Other Ambulatory Visit: Payer: Medicaid Other

## 2020-02-25 VITALS — BP 120/80 | Wt 157.0 lb

## 2020-02-25 DIAGNOSIS — Z3480 Encounter for supervision of other normal pregnancy, unspecified trimester: Secondary | ICD-10-CM

## 2020-02-25 DIAGNOSIS — Z113 Encounter for screening for infections with a predominantly sexual mode of transmission: Secondary | ICD-10-CM | POA: Diagnosis not present

## 2020-02-25 DIAGNOSIS — Z3482 Encounter for supervision of other normal pregnancy, second trimester: Secondary | ICD-10-CM

## 2020-02-25 DIAGNOSIS — Z3A29 29 weeks gestation of pregnancy: Secondary | ICD-10-CM

## 2020-02-25 LAB — POCT URINALYSIS DIPSTICK OB
Glucose, UA: NEGATIVE
POC,PROTEIN,UA: NEGATIVE

## 2020-02-25 LAB — OB RESULTS CONSOLE HIV ANTIBODY (ROUTINE TESTING): HIV: NONREACTIVE

## 2020-02-25 LAB — OB RESULTS CONSOLE VARICELLA ZOSTER ANTIBODY, IGG: Varicella: IMMUNE

## 2020-02-25 LAB — OB RESULTS CONSOLE RUBELLA ANTIBODY, IGM: Rubella: IMMUNE

## 2020-02-25 NOTE — Progress Notes (Signed)
No concerns.rj 

## 2020-02-25 NOTE — Progress Notes (Signed)
  Routine Prenatal Care Visit  Subjective  Breanna Webb is a 34 y.o. [phone] at [redacted]w[redacted]d being seen today for ongoing prenatal care.  She is currently monitored for the following issues for this high-risk pregnancy and has Supervision of other normal pregnancy, antepartum; Trichomonas vaginitis; Abnormal gallbladder ultrasound; and Abdominal pain in pregnancy, second trimester on their problem list.  ----------------------------------------------------------------------------------- Patient reports no complaints.  Having her 28 week labs today.  .  .   Pincus Large Fluid denies.  ----------------------------------------------------------------------------------- The following portions of the patient's history were reviewed and updated as appropriate: allergies, current medications, past family history, past medical history, past social history, past surgical history and problem list. Problem list updated.  Objective  Last menstrual period 08/02/2019. Pregravid weight 154 lb (69.9 kg) Total Weight Gain 3 lb (1.361 kg) Urinalysis: Urine Protein Negative  Urine Glucose Negative  Fetal Status:           General:  Alert, oriented and cooperative. Patient is in no acute distress.  Skin: Skin is warm and dry. No rash noted.   Cardiovascular: Normal heart rate noted  Respiratory: Normal respiratory effort, no problems with respiration noted  Abdomen: Soft, gravid, appropriate for gestational age.       Pelvic:  Cervical exam deferred        Extremities: Normal range of motion.     Mental Status: Normal mood and affect. Normal behavior. Normal judgment and thought content.   Assessment   34 y.o. [id number] at [redacted]w[redacted]d by  05/08/2020, by Last Menstrual Period presenting for routine prenatal visit  Plan   pregnancy Problems (from 10/15/19 to present)    Problem Noted Resolved   Supervision of other normal pregnancy, antepartum 10/15/2019 by Mirna Mires, CNM No   Overview Addendum 02/25/2020   3:35 PM by Mirna Mires, CNM     Clinic [facility] Prenatal Labs  Dating IUP, [place] confirms Blood type: B/Positive/-- (09/08 1505)   Genetic Screen  NIPS: Inheritest neg Antibody:Negative (09/08 1505)  Anatomic [place] Normal female Rubella: 2.77 (09/08 1505)  GTT      Third trimester:  RPR: Non Reactive (09/08 1505)   Flu vaccine  HBsAg: Negative (09/08 1505)   TDaP vaccine                                               Rhogam: NA HIV: Non Reactive (09/08 1505)   Baby Food                undecided                               GBS: (For PCN allergy, check sensitivities)  Contraception  Pap:    NILM with high Risk HPV  Circumcision    Pediatrician    Support Person              Previous Version       Preterm labor symptoms and general obstetric precautions including but not limited to vaginal bleeding, contractions, leaking of fluid and fetal movement were reviewed in detail with the patient. Please refer to After Visit Summary for other counseling recommendations.  1hr GTT today- 28 week labs.  No follow-ups on file.  Mirna Mires, CNM  02/25/2020 4:31 PM

## 2020-02-26 ENCOUNTER — Telehealth: Payer: Self-pay | Admitting: Obstetrics and Gynecology

## 2020-02-26 DIAGNOSIS — O99013 Anemia complicating pregnancy, third trimester: Secondary | ICD-10-CM | POA: Insufficient documentation

## 2020-02-26 LAB — 28 WEEK RH+PANEL
Basophils Absolute: 0 10*3/uL (ref 0.0–0.2)
Basos: 0 %
EOS (ABSOLUTE): 0.1 10*3/uL (ref 0.0–0.4)
Eos: 1 %
Gestational Diabetes Screen: 114 mg/dL (ref 65–139)
HIV Screen 4th Generation wRfx: NONREACTIVE
Hematocrit: 27 % — ABNORMAL LOW (ref 34.0–46.6)
Hemoglobin: 9 g/dL — ABNORMAL LOW (ref 11.1–15.9)
Immature Grans (Abs): 0.2 10*3/uL — ABNORMAL HIGH (ref 0.0–0.1)
Immature Granulocytes: 2 %
Lymphocytes Absolute: 1.9 10*3/uL (ref 0.7–3.1)
Lymphs: 17 %
MCH: 21.4 pg — ABNORMAL LOW (ref 26.6–33.0)
MCHC: 33.3 g/dL (ref 31.5–35.7)
MCV: 64 fL — ABNORMAL LOW (ref 79–97)
Monocytes Absolute: 0.6 10*3/uL (ref 0.1–0.9)
Monocytes: 5 %
Neutrophils Absolute: 8.4 10*3/uL — ABNORMAL HIGH (ref 1.4–7.0)
Neutrophils: 75 %
Platelets: 284 10*3/uL (ref 150–450)
RBC: 4.21 x10E6/uL (ref 3.77–5.28)
RDW: 16.9 % — ABNORMAL HIGH (ref 11.7–15.4)
RPR Ser Ql: NONREACTIVE
WBC: 11.2 10*3/uL — ABNORMAL HIGH (ref 3.4–10.8)

## 2020-02-26 MED ORDER — FERROUS SULFATE 325 (65 FE) MG PO TABS: 325.0000 mg | ORAL_TABLET | Freq: Two times a day (BID) | ORAL | 1 refills | Status: DC

## 2020-02-26 NOTE — Telephone Encounter (Signed)
Telephone call to patient. Two identifiers utilized. Reviewed CBC results- patient not currently taking oral fe supplementation. Fe Rx'd. Patient encouraged to take - reviewed side effects. Will f/u at next visit.

## 2020-03-01 NOTE — L&D Delivery Note (Signed)
Vaginal Delivery Note  Spontaneous delivery of live viable female infant from the ROA position through an intact perineum with restitution to ROT. Delivery of anterior left shoulder with gentle downward guidance followed by delivery of the right posterior shoulder with gentle upward guidance. Body followed spontaneously. Infant placed on maternal chest. Nursery present and helped with neonatal resuscitation and evaluation. Cord clamped and cut after two minutes. Cord blood collection was not indicated. Placenta delivered spontaneously and intact with a 3 vessel cord. Small periurethral abrasions were noted. There were no perineal lacerations. Uterus firm and below umbilicus at the end of the delivery.  Mom and baby recovering in stable condition. All counts correct at the end of the delivery.  APGARS: 1 minute: 9 5 minutes: 10 Weight: pending Epidural: placed  Zipporah Plants, CNM, MSN [facility], [facility] 05/14/20 9:47 AM

## 2020-03-07 ENCOUNTER — Other Ambulatory Visit: Payer: Self-pay

## 2020-03-07 ENCOUNTER — Inpatient Hospital Stay
Admission: EM | Admit: 2020-03-07 | Discharge: 2020-03-10 | DRG: 818 | Disposition: A | Discharge status: Home / Self Care | Payer: Medicaid Other | Attending: Obstetrics & Gynecology | Admitting: Obstetrics & Gynecology

## 2020-03-07 ENCOUNTER — Observation Stay: Payer: Medicaid Other

## 2020-03-07 ENCOUNTER — Encounter: Payer: Self-pay | Admitting: Obstetrics and Gynecology

## 2020-03-07 DIAGNOSIS — Z20822 Contact with and (suspected) exposure to covid-19: Secondary | ICD-10-CM | POA: Diagnosis present

## 2020-03-07 DIAGNOSIS — K8050 Calculus of bile duct without cholangitis or cholecystitis without obstruction: Secondary | ICD-10-CM | POA: Diagnosis not present

## 2020-03-07 DIAGNOSIS — Z3A31 31 weeks gestation of pregnancy: Secondary | ICD-10-CM

## 2020-03-07 DIAGNOSIS — Z87891 Personal history of nicotine dependence: Secondary | ICD-10-CM

## 2020-03-07 DIAGNOSIS — Z3480 Encounter for supervision of other normal pregnancy, unspecified trimester: Secondary | ICD-10-CM

## 2020-03-07 DIAGNOSIS — R109 Unspecified abdominal pain: Secondary | ICD-10-CM | POA: Diagnosis present

## 2020-03-07 DIAGNOSIS — O219 Vomiting of pregnancy, unspecified: Secondary | ICD-10-CM | POA: Diagnosis present

## 2020-03-07 DIAGNOSIS — K828 Other specified diseases of gallbladder: Secondary | ICD-10-CM | POA: Diagnosis not present

## 2020-03-07 DIAGNOSIS — O26893 Other specified pregnancy related conditions, third trimester: Secondary | ICD-10-CM | POA: Diagnosis present

## 2020-03-07 DIAGNOSIS — Z880 Allergy status to penicillin: Secondary | ICD-10-CM

## 2020-03-07 DIAGNOSIS — O26613 Liver and biliary tract disorders in pregnancy, third trimester: Principal | ICD-10-CM | POA: Diagnosis present

## 2020-03-07 DIAGNOSIS — K829 Disease of gallbladder, unspecified: Secondary | ICD-10-CM | POA: Diagnosis present

## 2020-03-07 DIAGNOSIS — Z3490 Encounter for supervision of normal pregnancy, unspecified, unspecified trimester: Secondary | ICD-10-CM

## 2020-03-07 DIAGNOSIS — K8000 Calculus of gallbladder with acute cholecystitis without obstruction: Secondary | ICD-10-CM | POA: Diagnosis present

## 2020-03-07 LAB — COMPREHENSIVE METABOLIC PANEL
ALT: 37 U/L (ref 0–44)
AST: 33 U/L (ref 15–41)
Albumin: 3.8 g/dL (ref 3.5–5.0)
Alkaline Phosphatase: 60 U/L (ref 38–126)
Anion gap: 14 (ref 5–15)
BUN: 9 mg/dL (ref 6–20)
CO2: 22 mmol/L (ref 22–32)
Calcium: 9.4 mg/dL (ref 8.9–10.3)
Chloride: 97 mmol/L — ABNORMAL LOW (ref 98–111)
Creatinine, Ser: 0.39 mg/dL — ABNORMAL LOW (ref 0.44–1.00)
GFR, Estimated: >60 mL/min (ref >60)
Glucose, Bld: 94 mg/dL (ref 70–99)
Potassium: 2.8 mmol/L — ABNORMAL LOW (ref 3.5–5.1)
Sodium: 133 mmol/L — ABNORMAL LOW (ref 135–145)
Total Bilirubin: 2.8 mg/dL — ABNORMAL HIGH (ref 0.3–1.2)
Total Protein: 8.1 g/dL (ref 6.5–8.1)

## 2020-03-07 LAB — CBC
HCT: 31.2 % — ABNORMAL LOW (ref 36.0–46.0)
Hemoglobin: 10.4 g/dL — ABNORMAL LOW (ref 12.0–15.0)
MCH: 21 pg — ABNORMAL LOW (ref 26.0–34.0)
MCHC: 33.3 g/dL (ref 30.0–36.0)
MCV: 62.9 fL — ABNORMAL LOW (ref 80.0–100.0)
Platelets: 234 10*3/uL (ref 150–400)
RBC: 4.96 MIL/uL (ref 3.87–5.11)
RDW: 16.7 % — ABNORMAL HIGH (ref 11.5–15.5)
WBC: 14.7 10*3/uL — ABNORMAL HIGH (ref 4.0–10.5)
nRBC: 0.1 % (ref 0.0–0.2)

## 2020-03-07 LAB — URINALYSIS, ROUTINE W REFLEX MICROSCOPIC
Bacteria, UA: NONE SEEN
Glucose, UA: NEGATIVE mg/dL
Hgb urine dipstick: NEGATIVE
Ketones, ur: 80 mg/dL — AB
Nitrite: NEGATIVE
Protein, ur: 100 mg/dL — AB
Specific Gravity, Urine: 1.027 (ref 1.005–1.030)
pH: 6 (ref 5.0–8.0)

## 2020-03-07 LAB — RESP PANEL BY RT-PCR (FLU A&B, COVID) ARPGX2
Influenza A by PCR: NEGATIVE
Influenza B by PCR: NEGATIVE
SARS Coronavirus 2 by RT PCR: NEGATIVE

## 2020-03-07 MED ORDER — LACTATED RINGERS IV BOLUS
500.0000 mL | Freq: Once | INTRAVENOUS | Status: AC
  Administered 2020-03-07: 500 mL via INTRAVENOUS

## 2020-03-07 MED ORDER — ACETAMINOPHEN 325 MG PO TABS
650.0000 mg | ORAL_TABLET | ORAL | Status: DC | PRN
  Administered 2020-03-08: 650 mg via ORAL
  Filled 2020-03-07: qty 2

## 2020-03-07 MED ORDER — POTASSIUM CHLORIDE 10 MEQ/100ML IV SOLN
10.0000 meq | Freq: Once | INTRAVENOUS | Status: AC
  Administered 2020-03-07: 10 meq via INTRAVENOUS
  Filled 2020-03-07: qty 100

## 2020-03-07 MED ORDER — POTASSIUM CHLORIDE 10 MEQ/100ML IV SOLN
10.0000 meq | INTRAVENOUS | Status: AC
  Administered 2020-03-07: 10 meq via INTRAVENOUS
  Filled 2020-03-07: qty 100

## 2020-03-07 MED ORDER — POTASSIUM CHLORIDE 20 MEQ PO PACK
40.0000 meq | PACK | Freq: Once | ORAL | Status: AC
  Administered 2020-03-07: 40 meq via ORAL
  Filled 2020-03-07 (×2): qty 2

## 2020-03-07 MED ORDER — LACTATED RINGERS IV SOLN: INTRAVENOUS | Status: DC

## 2020-03-07 MED ORDER — ONDANSETRON HCL 4 MG/2ML IJ SOLN
4.0000 mg | Freq: Four times a day (QID) | INTRAMUSCULAR | Status: DC | PRN
  Administered 2020-03-07 – 2020-03-08 (×3): 4 mg via INTRAVENOUS
  Filled 2020-03-07 (×3): qty 2

## 2020-03-07 NOTE — Progress Notes (Signed)
Accessed patient at 18:30  She reported that her pain and nausea was improving with her treatments her IV fluids.  She denied fevers. Abdomen overall benign with only mild focal tenderness in RUQ. Low potassium and receiving IV replacements. Tried PO  Potassium replacement and patient vomited. Given low PO intake and this prolonged course of nausea and vomiting will consult with general surgery.   Adelene Idler MD, [facility], [facility] 03/07/2020 10:34 PM

## 2020-03-07 NOTE — H&P (Signed)
Obstetric H&P   Chief Complaint: Nausea and vomiting  Prenatal Care Provider: [facility]  History of Present Illness: 35 y.o. [id number] [redacted]w[redacted]d by 05/08/2020, by Last Menstrual Period presenting to L&D for evaluation with N/V that started four days ago. Patient states that she had a steak with macaroni and cheese prior to the onset of these sx. Patient states that the sx are similar to what she has experienced previously when seen during this pregnancy and dx with gallbladder "sludge" on [place]. Patient has not had a BM since Tuesday, but states that her last was "watery." Patient denies fevers, but reports a COVID-19 exposure on Monday. Patient states that she has not been able to eat food since Monday, has been tolerating PO intake of fluids. Patient reports intermittent RUQ pain that radiates to her shoulder blade which is associated with movement. Denies pain at rest.   Pregravid weight 69.9 kg Total Weight Gain 1.361 kg  pregnancy Problems (from 10/15/19 to present)    Problem Noted Resolved   Abdominal pain in pregnancy, third trimester 01/16/2020 by Mirna Mires, CNM No   Supervision of other normal pregnancy, antepartum 10/15/2019 by Mirna Mires, CNM No   Overview Addendum 02/25/2020  3:35 PM by Mirna Mires, CNM     Clinic [facility] Prenatal Labs  Dating IUP, [place] confirms Blood type: B/Positive/-- (09/08 1505)   Genetic Screen  NIPS: Inheritest neg Antibody:Negative (09/08 1505)  Anatomic [place] Normal female Rubella: 2.77 (09/08 1505)  GTT      Third trimester:  RPR: Non Reactive (09/08 1505)   Flu vaccine  HBsAg: Negative (09/08 1505)   TDaP vaccine                                               Rhogam: NA HIV: Non Reactive (09/08 1505)   Baby Food                undecided                               GBS: (For PCN allergy, check sensitivities)  Contraception  Pap:    NILM with high Risk HPV  Circumcision    Pediatrician    Support Person              Previous  Version       Review of Systems:  Constitutional: Negative for chills, fever and malaise/fatigue.  HENT: Negative.   Eyes: Negative.   Respiratory: Negative for cough and shortness of breath.   Cardiovascular: Negative.   Gastrointestinal: Positive for abdominal pain, diarrhea, nausea and vomiting.       Abd is intermittent, associated with movement, occurs in RUQ  Genitourinary: Negative for dysuria, flank pain and urgency.  Musculoskeletal: Negative for myalgias.  Skin: Negative.   Neurological: Negative for headaches.  Endo/Heme/Allergies: Negative.   Psychiatric/Behavioral: Negative.    Past Medical History: Patient Active Problem List   Diagnosis Date Noted  . Pregnancy 03/07/2020  . [redacted] weeks gestation of pregnancy 03/07/2020  . Anemia during pregnancy in third trimester 02/26/2020  . Abdominal pain in pregnancy, third trimester 01/16/2020  . Abnormal gallbladder ultrasound 12/13/2019  . Trichomonas vaginitis 10/29/2019    09/2019- detected at [facility] appt. Pt contacted by phone- Rx for flagyl sent to her  pharmacy.   . Supervision of other normal pregnancy, antepartum 10/15/2019     Clinic [facility] Prenatal Labs  Dating IUP, [place] confirms Blood type: B/Positive/-- (09/08 1505)   Genetic Screen  NIPS: Inheritest neg Antibody:Negative (09/08 1505)  Anatomic [place] Normal female Rubella: 2.77 (09/08 1505)  GTT      Third trimester:  RPR: Non Reactive (09/08 1505)   Flu vaccine  HBsAg: Negative (09/08 1505)   TDaP vaccine                                               Rhogam: NA HIV: Non Reactive (09/08 1505)   Baby Food                undecided                               GBS: (For PCN allergy, check sensitivities)  Contraception  Pap:    NILM with high Risk HPV  Circumcision    Pediatrician    Support Person             Past Surgical History: Past Surgical History:  Procedure Laterality Date  . NO PAST SURGERIES      Past Obstetric History: # 1 - Date: None, Sex:  None, Weight: None, GA: None, Delivery: None, Apgar1: None, Apgar5: None, Living: None, Birth Comments: None  # 2 - Date: None, Sex: None, Weight: None, GA: None, Delivery: Vaginal, Spontaneous, Apgar1: None, Apgar5: None, Living: Living, Birth Comments: None  # 3 - Date: None, Sex: None, Weight: None, GA: None, Delivery: Vaginal, Spontaneous, Apgar1: None, Apgar5: None, Living: Living, Birth Comments: None  # 4 - Date: None, Sex: None, Weight: None, GA: None, Delivery: Vaginal, Spontaneous, Apgar1: None, Apgar5: None, Living: Living, Birth Comments: None  # 5 - Date: None, Sex: None, Weight: None, GA: None, Delivery: None, Apgar1: None, Apgar5: None, Living: None, Birth Comments: None   Past Gynecologic History:  Family History: Family History  Problem Relation Age of Onset  . Healthy Mother   . Healthy Father     Social History: Social History   Socioeconomic History  . Marital status: Single    Spouse name: Not on file  . Number of children: 3  . Years of education: Not on file  . Highest education level: Not on file  Occupational History  . Not on file  Tobacco Use  . Smoking status: Former Games developer  . Smokeless tobacco: Never Used  . Tobacco comment: quit 2015  Vaping Use  . Vaping Use: Never used  Substance and Sexual Activity  . Alcohol use: Not Currently    Comment: 2-3 per week when not pregnant  . Drug use: Not Currently    Types: Marijuana    Comment: smoking when zofran not working,  last 10/3  . Sexual activity: Yes    Birth control/protection: None  Other Topics Concern  . Not on file  Social History Narrative  . Not on file   Social Determinants of Health   Financial Resource Strain: Not on file  Food Insecurity: Not on file  Transportation Needs: Not on file  Physical Activity: Not on file  Stress: Not on file  Social Connections: Not on file  Intimate Partner Violence: Not At Risk  . Fear of Current  or Ex-Partner: No  . Emotionally Abused: No   . Physically Abused: No  . Sexually Abused: No    Medications: Prior to Admission medications   Medication Sig Start Date End Date Taking? Authorizing Provider  Doxylamine-Pyridoxine (DICLEGIS) 10-10 MG TBEC Take 1-2 tablets by mouth at bedtime. Start by taking two tablets at bedtime on day 1 and 2; if symptoms persist, take 1 tablet in morning and 2 tablets at bedtime on day 3; if symptoms persist, may increase to 1 tablet in morning, 1 tablet mid-afternoon, and 2 tablets at bedtime on day 4 (maximum: doxylamine 40 mg/pyridoxine 40 mg (4 tablets) per day). 12/03/19  Yes Gaylan Gerold R, CNM  ferrous sulfate (FERROUSUL) 325 (65 FE) MG tablet Take 1 tablet (325 mg total) by mouth 2 (two) times daily. 02/26/20  Yes Fahad Cisse, Marolyn Hammock, CNM  Prenatal Vit-Fe Fumarate-FA (MULTIVITAMIN-PRENATAL) 27-0.8 MG TABS tablet Take 1 tablet by mouth daily at 12 noon.    Yes [provider]  promethazine (PHENERGAN) 12.5 MG tablet Take 1 tablet (12.5 mg total) by mouth every 6 (six) hours as needed for nausea or vomiting. Take for breakthrough nausea when diclegis isn't working 12/03/19  Yes Gaylan Gerold R, CNM  simethicone (GAS-X) 80 MG chewable tablet Chew 1 tablet (80 mg total) by mouth every 6 (six) hours as needed for flatulence. 12/03/19  Yes Walker, Samella Parr R, CNM  nitrofurantoin, macrocrystal-monohydrate, (MACROBID) 100 MG capsule Take 1 capsule (100 mg total) by mouth 2 (two) times daily. Patient not taking: No sig reported 01/16/20   Imagene Riches, CNM    Allergies: No Known Allergies  Physical Exam: BP 124/80 (BP Location: Left Arm)   Pulse 80   Temp 98.2 F (36.8 C) (Oral)   Resp 16   Ht 5' (1.524 m)   Wt 71.2 kg   LMP 08/02/2019   BMI 30.66 kg/m   Physical Exam Constitutional:      General: She is not in acute distress.    Appearance: Normal appearance.  Genitourinary:     Genitourinary Comments: deferred  Cardiovascular:     Rate and Rhythm: Normal rate and regular  rhythm.  Pulmonary:     Effort: Pulmonary effort is normal.  Abdominal:     Tenderness: There is abdominal tenderness. There is no guarding or rebound.     Comments: Gravid, size consistent with 30 week dates  Neurological:     General: No focal deficit present.     Mental Status: She is alert and oriented to person, place, and time.  Skin:    General: Skin is warm and dry.  Psychiatric:        Mood and Affect: Mood normal.        Behavior: Behavior normal.  Vitals and nursing note reviewed.   Labs: Results for orders placed or performed during the hospital encounter of 03/07/20 (from the past 24 hour(s))  Resp Panel by RT-PCR (Flu A&B, Covid) Nasopharyngeal Swab     Status: None   Collection Time: 03/07/20  1:23 PM   Specimen: Nasopharyngeal Swab; Nasopharyngeal(NP) swabs in vial transport medium  Result Value Ref Range   SARS Coronavirus 2 by RT PCR NEGATIVE NEGATIVE   Influenza A by PCR NEGATIVE NEGATIVE   Influenza B by PCR NEGATIVE NEGATIVE  Urinalysis, Routine w reflex microscopic Urine, Clean Catch     Status: Abnormal   Collection Time: 03/07/20  1:37 PM  Result Value Ref Range   Color, Urine AMBER (A) YELLOW  APPearance HAZY (A) CLEAR   Specific Gravity, Urine 1.027 1.005 - 1.030   pH 6.0 5.0 - 8.0   Glucose, UA NEGATIVE NEGATIVE mg/dL   Hgb urine dipstick NEGATIVE NEGATIVE   Bilirubin Urine SMALL (A) NEGATIVE   Ketones, ur 80 (A) NEGATIVE mg/dL   Protein, ur 735 (A) NEGATIVE mg/dL   Nitrite NEGATIVE NEGATIVE   Leukocytes,Ua TRACE (A) NEGATIVE   RBC / HPF 0-5 0 - 5 RBC/hpf   WBC, UA 6-10 0 - 5 WBC/hpf   Bacteria, UA NONE SEEN NONE SEEN   Squamous Epithelial / LPF 6-10 0 - 5   Mucus PRESENT    Hyaline Casts, UA PRESENT   Comprehensive metabolic panel     Status: Abnormal   Collection Time: 03/07/20  1:37 PM  Result Value Ref Range   Sodium 133 (L) 135 - 145 mmol/L   Potassium 2.8 (L) 3.5 - 5.1 mmol/L   Chloride 97 (L) 98 - 111 mmol/L   CO2 22 22 - 32 mmol/L    Glucose, Bld 94 70 - 99 mg/dL   BUN 9 6 - 20 mg/dL   Creatinine, Ser 3.29 (L) 0.44 - 1.00 mg/dL   Calcium 9.4 8.9 - 92.4 mg/dL   Total Protein 8.1 6.5 - 8.1 g/dL   Albumin 3.8 3.5 - 5.0 g/dL   AST 33 15 - 41 U/L   ALT 37 0 - 44 U/L   Alkaline Phosphatase 60 38 - 126 U/L   Total Bilirubin 2.8 (H) 0.3 - 1.2 mg/dL   GFR, Estimated >26 >83 mL/min   Anion gap 14 5 - 15  CBC     Status: Abnormal   Collection Time: 03/07/20  1:38 PM  Result Value Ref Range   WBC 14.7 (H) 4.0 - 10.5 K/uL   RBC 4.96 3.87 - 5.11 MIL/uL   Hemoglobin 10.4 (L) 12.0 - 15.0 g/dL   HCT 41.9 (L) 62.2 - 29.7 %   MCV 62.9 (L) 80.0 - 100.0 fL   MCH 21.0 (L) 26.0 - 34.0 pg   MCHC 33.3 30.0 - 36.0 g/dL   RDW 98.9 (H) 21.1 - 94.1 %   Platelets 234 150 - 400 K/uL   nRBC 0.1 0.0 - 0.2 %    Assessment: 35 y.o. [id number] [redacted]w[redacted]d by 05/08/2020, by Last Menstrual Period who presents with S&S consistent with biliary colic.   Plan: 1) Hx of abnormal gallbladder US - will check labs, repeat US, IVF and antiemetics   2) Fetus - Reactive NST  3) PNL - Blood type B/Positive/-- (09/08 1505) / Anti-bodyscreen Negative (09/08 1505) / Rubella 2.77 (09/08 1505) / Varicella immune / RPR Non Reactive (12/27 1555) / HBsAg Negative (09/08 1505) / HIV Non Reactive (12/27 1555) / 1-hr OGTT 114 / GBS  n/a  5) Disposition - Pending further evaluation.  Zipporah Plants, CNM, MSN [facility], [facility] 03/07/2020, 5:24 PM

## 2020-03-07 NOTE — OB Triage Note (Signed)
Pt reports "can't keep anything down." C/O nausea and vomiting since Monday. Denies diarrhea, loose stools x 2. Elaina Hoops

## 2020-03-08 ENCOUNTER — Encounter: Payer: Self-pay | Admitting: Obstetrics & Gynecology

## 2020-03-08 DIAGNOSIS — K8000 Calculus of gallbladder with acute cholecystitis without obstruction: Secondary | ICD-10-CM | POA: Diagnosis not present

## 2020-03-08 DIAGNOSIS — K824 Cholesterolosis of gallbladder: Secondary | ICD-10-CM | POA: Diagnosis not present

## 2020-03-08 DIAGNOSIS — K811 Chronic cholecystitis: Secondary | ICD-10-CM | POA: Diagnosis not present

## 2020-03-08 DIAGNOSIS — Z20822 Contact with and (suspected) exposure to covid-19: Secondary | ICD-10-CM | POA: Diagnosis not present

## 2020-03-08 DIAGNOSIS — Z3A31 31 weeks gestation of pregnancy: Secondary | ICD-10-CM

## 2020-03-08 DIAGNOSIS — O26613 Liver and biliary tract disorders in pregnancy, third trimester: Principal | ICD-10-CM

## 2020-03-08 DIAGNOSIS — K8050 Calculus of bile duct without cholangitis or cholecystitis without obstruction: Secondary | ICD-10-CM

## 2020-03-08 DIAGNOSIS — Z87891 Personal history of nicotine dependence: Secondary | ICD-10-CM | POA: Diagnosis not present

## 2020-03-08 DIAGNOSIS — K829 Disease of gallbladder, unspecified: Secondary | ICD-10-CM | POA: Diagnosis present

## 2020-03-08 DIAGNOSIS — O212 Late vomiting of pregnancy: Secondary | ICD-10-CM | POA: Diagnosis not present

## 2020-03-08 DIAGNOSIS — Z880 Allergy status to penicillin: Secondary | ICD-10-CM | POA: Diagnosis not present

## 2020-03-08 LAB — HEPATIC FUNCTION PANEL
ALT: 38 U/L (ref 0–44)
AST: 30 U/L (ref 15–41)
Albumin: 3.1 g/dL — ABNORMAL LOW (ref 3.5–5.0)
Alkaline Phosphatase: 54 U/L (ref 38–126)
Bilirubin, Direct: 0.9 mg/dL — ABNORMAL HIGH (ref 0.0–0.2)
Indirect Bilirubin: 1.8 mg/dL — ABNORMAL HIGH (ref 0.3–0.9)
Total Bilirubin: 2.7 mg/dL — ABNORMAL HIGH (ref 0.3–1.2)
Total Protein: 6.7 g/dL (ref 6.5–8.1)

## 2020-03-08 LAB — BASIC METABOLIC PANEL
Anion gap: 13 (ref 5–15)
Anion gap: 12 (ref 5–15)
BUN: 6 mg/dL (ref 6–20)
BUN: <5 mg/dL — ABNORMAL LOW (ref 6–20)
CO2: 21 mmol/L — ABNORMAL LOW (ref 22–32)
CO2: 20 mmol/L — ABNORMAL LOW (ref 22–32)
Calcium: 8.7 mg/dL — ABNORMAL LOW (ref 8.9–10.3)
Calcium: 8.8 mg/dL — ABNORMAL LOW (ref 8.9–10.3)
Chloride: 100 mmol/L (ref 98–111)
Chloride: 101 mmol/L (ref 98–111)
Creatinine, Ser: <0.3 mg/dL — ABNORMAL LOW (ref 0.44–1.00)
Creatinine, Ser: 0.32 mg/dL — ABNORMAL LOW (ref 0.44–1.00)
GFR, Estimated: >60 mL/min (ref >60)
Glucose, Bld: 85 mg/dL (ref 70–99)
Glucose, Bld: 81 mg/dL (ref 70–99)
Potassium: 3.1 mmol/L — ABNORMAL LOW (ref 3.5–5.1)
Potassium: 3.7 mmol/L (ref 3.5–5.1)
Sodium: 134 mmol/L — ABNORMAL LOW (ref 135–145)
Sodium: 133 mmol/L — ABNORMAL LOW (ref 135–145)

## 2020-03-08 MED ORDER — POTASSIUM CHLORIDE 10 MEQ/100ML IV SOLN
10.0000 meq | INTRAVENOUS | Status: AC
  Administered 2020-03-08 (×4): 10 meq via INTRAVENOUS
  Filled 2020-03-08: qty 100
  Filled 2020-03-08: qty 300

## 2020-03-08 MED ORDER — HYDROCODONE-ACETAMINOPHEN 5-325 MG PO TABS
1.0000 | ORAL_TABLET | Freq: Four times a day (QID) | ORAL | Status: DC | PRN
Start: 2020-03-08 — End: 2020-03-10
  Administered 2020-03-08 – 2020-03-10 (×6): 1 via ORAL
  Filled 2020-03-08 (×6): qty 1

## 2020-03-08 MED ORDER — PRENATAL 27-0.8 MG PO TABS
1.0000 | ORAL_TABLET | Freq: Every day | ORAL | Status: DC
  Filled 2020-03-08 (×3): qty 1

## 2020-03-08 MED ORDER — ONDANSETRON HCL 4 MG PO TABS
4.0000 mg | ORAL_TABLET | Freq: Four times a day (QID) | ORAL | Status: DC
  Administered 2020-03-08 – 2020-03-10 (×6): 4 mg via ORAL
  Filled 2020-03-08 (×6): qty 1

## 2020-03-08 NOTE — Progress Notes (Signed)
Benign Gynecology Progress Note  Admission Date: 03/07/2020 Current Date: 03/08/2020  Declan Adamson is a 35 y.o. [id number] HD#1 @ [redacted]w[redacted]d by LMP/US.  History complicated by: Patient Active Problem List   Diagnosis Date Noted  . Gallbladder disease affecting pregnancy in third trimester 03/08/2020  . Recurrent biliary colic   . Pregnancy 03/07/2020  . [redacted] weeks gestation of pregnancy 03/07/2020  . Anemia during pregnancy in third trimester 02/26/2020  . Abdominal pain in pregnancy, third trimester 01/16/2020  . Abnormal gallbladder ultrasound 12/13/2019  . Trichomonas vaginitis 10/29/2019  . Supervision of other normal pregnancy, antepartum 10/15/2019   ROS and patient/family/surgical history, located on admission H&P note dated 03/07/2020, have been reviewed, and there are no changes except as noted below  Yesterday/Overnight Events:  [place] and labs Gen Surg Consult  Subjective:  Pt reports mild pain and mild nausea this am, has not tried a diet yet. She has discussed w General Surgeon Cintron pros and cons of cholecystectomy, and feels she would rather wait until after pregnancy if possible.   Good FM.  Objective:   Temp:  [97.6 F (36.4 C)-98.6 F (37 C)] 97.6 F (36.4 C) (01/08 0718) Pulse Rate:  [65-80] 68 (01/08 0718) Resp:  [16-18] 18 (01/08 0237) BP: (111-125)/(67-85) 111/67 (01/08 0718) Weight:  [71.2 kg] 71.2 kg (01/07 1305) No intake/output data recorded. No intake/output data recorded. No intake or output data in the 24 hours ending 03/08/20 5726  Physical exam: General appearance: alert, cooperative and appears stated age Abdomen: soft but gravid, FH 39 cm and NT.  Pt has mild RUQ pain to deep palpation.  No other pain.  ND. NABS. GU: No gross VB Lungs: clear to auscultation bilaterally Heart: regular rate and rhythm and no MRGs Extremities: no redness or tenderness in the calves or thighs, no edema Skin: no lesions Psych: appropriate  Recent Labs  Lab  03/07/20 1337 03/08/20 0028  ALKPHOS 60 54  BILITOT 2.8* 2.7*  BILIDIR  --  0.9*  PROT 8.1 6.7  ALT 37 38  AST 33 30    Recent Labs  Lab 03/07/20 1338  WBC 14.7*  HGB 10.4*  HCT 31.2*  PLT 234   Recent Labs  Lab 03/07/20 1337 03/08/20 0028 03/08/20 0738  NA 133* 134* 133*  K 2.8* 3.1* 3.7  CL 97* 100 101  CO2 22 21* 20*  BUN 9 6 <5*  CREATININE 0.39* <0.30* 0.32*  CALCIUM 9.4 8.7* 8.8*  PROT 8.1 6.7  --   BILITOT 2.8* 2.7*  --   ALKPHOS 60 54  --   ALT 37 38  --   AST 33 30  --   GLUCOSE 94 85 81    Radiology See [place] report  Assessment & Plan:   Problem List Items Addressed This Visit    Supervision of other normal pregnancy, antepartum, at 31 weeks   Recurrent biliary colic Pain and nausea related to biliary disease   Discussed pros and cons as it related to pregnancy; risks of PTL w surgery, risks of PTL or continued pain and narcotic use without surgery.  Pt continues to prefer and elect for no surgery at this time.  Will allow diet and activity today to see if can tolerate pain and diet.  If pain escalates again, then consider surgery tomorrow or in future.  Consider outpatient management if does tolerate diet well, but will give at least another 24 hours to ensure tolerability.  FWR.  NST q shift. K+ improved  w infusions; recheck in am  A total of 40 minutes were spent face-to-face with the patient as well as preparation, review, communication, and documentation during this encounter.   Annamarie Major, MD, [facility], [facility] 03/08/2020  9:07 AM

## 2020-03-08 NOTE — H&P (View-Only) (Signed)
SURGICAL CONSULTATION NOTE   HISTORY OF PRESENT ILLNESS (HPI):  35 y.o. female presented to [facility] ED for evaluation of abdominal pain nausea and vomiting since 5 days ago. Patient reports she has been having abdominal pain associated with nausea or vomiting after eating and drinking anything since 5 days ago.  She reported that initially was with everything that she eats but she was able to tolerate liquids.  Now she is having abdominal pain nausea or vomiting even with small amount of liquids.  The pain is in the epigastric and right upper quadrant.  No pain radiation.  There has been no alleviating factors.  Aggravating factor is any oral intake.  Patient was admitted for observation by obstetrician.  Ultrasound was done showing significant amount of sludge.  Patient with normal leukocyte count for her pregnancy.  Patient is 88 weeks of gestation age.  Otherwise there has been no other complications during the pregnancy.  Surgery is consulted by Dr. Gaynelle Arabian in this context for evaluation and management of symptomatic cholelithiasis.  PAST MEDICAL HISTORY (PMH):  Past Medical History:  Diagnosis Date  . Anemia    Patient reports anemia with her pregnancies  . Infection    UTI  . Miscarriage   . Vaginal Pap smear, abnormal      PAST SURGICAL HISTORY (PSH):  Past Surgical History:  Procedure Laterality Date  . NO PAST SURGERIES       MEDICATIONS:  Prior to Admission medications   Medication Sig Start Date End Date Taking? Authorizing Provider  Doxylamine-Pyridoxine (DICLEGIS) 10-10 MG TBEC Take 1-2 tablets by mouth at bedtime. Start by taking two tablets at bedtime on day 1 and 2; if symptoms persist, take 1 tablet in morning and 2 tablets at bedtime on day 3; if symptoms persist, may increase to 1 tablet in morning, 1 tablet mid-afternoon, and 2 tablets at bedtime on day 4 (maximum: doxylamine 40 mg/pyridoxine 40 mg (4 tablets) per day). 12/03/19  Yes Edd Arbour R, CNM  ferrous  sulfate (FERROUSUL) 325 (65 FE) MG tablet Take 1 tablet (325 mg total) by mouth 2 (two) times daily. 02/26/20  Yes Veal, Konrad Felix, CNM  Prenatal Vit-Fe Fumarate-FA (MULTIVITAMIN-PRENATAL) 27-0.8 MG TABS tablet Take 1 tablet by mouth daily at 12 noon.    Yes [provider]  promethazine (PHENERGAN) 12.5 MG tablet Take 1 tablet (12.5 mg total) by mouth every 6 (six) hours as needed for nausea or vomiting. Take for breakthrough nausea when diclegis isn't working 12/03/19  Yes Edd Arbour R, CNM  simethicone (GAS-X) 80 MG chewable tablet Chew 1 tablet (80 mg total) by mouth every 6 (six) hours as needed for flatulence. 12/03/19  Yes Walker, Harvin Hazel R, CNM  nitrofurantoin, macrocrystal-monohydrate, (MACROBID) 100 MG capsule Take 1 capsule (100 mg total) by mouth 2 (two) times daily. Patient not taking: No sig reported 01/16/20   Mirna Mires, CNM     ALLERGIES:  No Known Allergies   SOCIAL HISTORY:  Social History   Socioeconomic History  . Marital status: Single    Spouse name: Not on file  . Number of children: 3  . Years of education: Not on file  . Highest education level: Not on file  Occupational History  . Not on file  Tobacco Use  . Smoking status: Former Games developer  . Smokeless tobacco: Never Used  . Tobacco comment: quit 2015  Vaping Use  . Vaping Use: Never used  Substance and Sexual Activity  . Alcohol use: Not Currently  Comment: 2-3 per week when not pregnant  . Drug use: Not Currently    Types: Marijuana    Comment: smoking when zofran not working,  last 10/3  . Sexual activity: Yes    Birth control/protection: None  Other Topics Concern  . Not on file  Social History Narrative  . Not on file   Social Determinants of Health   Financial Resource Strain: Not on file  Food Insecurity: Not on file  Transportation Needs: Not on file  Physical Activity: Not on file  Stress: Not on file  Social Connections: Not on file  Intimate Partner Violence: Not  At Risk  . Fear of Current or Ex-Partner: No  . Emotionally Abused: No  . Physically Abused: No  . Sexually Abused: No      FAMILY HISTORY:  Family History  Problem Relation Age of Onset  . Healthy Mother   . Healthy Father      REVIEW OF SYSTEMS:  Constitutional: denies weight loss, fever, chills, or sweats  Eyes: denies any other vision changes, history of eye injury  ENT: denies sore throat, hearing problems  Respiratory: denies shortness of breath, wheezing  Cardiovascular: denies chest pain, palpitations  Gastrointestinal: Positive abdominal pain, nausea and vomiting Genitourinary: denies burning with urination or urinary frequency Musculoskeletal: denies any other joint pains or cramps  Skin: denies any other rashes or skin discolorations  Neurological: denies any other headache, dizziness, weakness  Psychiatric: denies any other depression, anxiety   All other review of systems were negative   VITAL SIGNS:  Temp:  [97.6 F (36.4 C)-98.6 F (37 C)] 98 F (36.7 C) (01/08 1307) Pulse Rate:  [60-72] 63 (01/08 1307) Resp:  [14-18] 18 (01/08 1307) BP: (111-138)/(67-94) 138/94 (01/08 1307) SpO2:  [99 %] 99 % (01/08 1307)     Height: 5' (152.4 cm) Weight: 71.2 kg BMI (Calculated): 30.66   INTAKE/OUTPUT:  This shift: No intake/output data recorded.  Last 2 shifts: @IOLAST2SHIFTS @   PHYSICAL EXAM:  Constitutional:  -- Normal body habitus  -- Awake, alert, and oriented x3  Eyes:  -- Pupils equally round and reactive to light  -- No scleral icterus  Ear, nose, and throat:  -- No jugular venous distension  Pulmonary:  -- No crackles  -- Equal breath sounds bilaterally -- Breathing non-labored at rest Cardiovascular:  -- S1, S2 present  -- No pericardial rubs Gastrointestinal:  -- Abdomen soft, nontender, non-distended, no guarding or rebound tenderness --Gravidus with uterus palpated above the umbilical area Musculoskeletal and Integumentary:  -- Wounds: None  appreciated -- Extremities: B/L UE and LE FROM, hands and feet warm, no edema  Neurologic:  -- Motor function: intact and symmetric -- Sensation: intact and symmetric   Labs:  CBC Latest Ref Rng & Units 03/07/2020 02/25/2020 01/16/2020  WBC 4.0 - 10.5 K/uL 14.7(H) 11.2(H) 16.4(H)  Hemoglobin 12.0 - 15.0 g/dL 10.4(L) 9.0(L) 8.6(L)  Hematocrit 36.0 - 46.0 % 31.2(L) 27.0(L) 25.4(L)  Platelets 150 - 400 K/uL 234 284 202   CMP Latest Ref Rng & Units 03/08/2020 03/08/2020 03/07/2020  Glucose 70 - 99 mg/dL 81 85 94  BUN 6 - 20 mg/dL 05/05/2020) 6 9  Creatinine <2(T - 1.00 mg/dL 5.57) 3.22(G) <2.54(Y)  Sodium 135 - 145 mmol/L 133(L) 134(L) 133(L)  Potassium 3.5 - 5.1 mmol/L 3.7 3.1(L) 2.8(L)  Chloride 98 - 111 mmol/L 101 100 97(L)  CO2 22 - 32 mmol/L 20(L) 21(L) 22  Calcium 8.9 - 10.3 mg/dL 7.06(C) 3.7(S) 9.4  Total Protein 6.5 - 8.1 g/dL - 6.7 8.1  Total Bilirubin 0.3 - 1.2 mg/dL - 2.7(H) 2.8(H)  Alkaline Phos 38 - 126 U/L - 54 60  AST 15 - 41 U/L - 30 33  ALT 0 - 44 U/L - 38 37     Imaging studies:  I personally evaluated the images of the ultrasound.  There is significant sludge identified.  No pericholecystic fluid.  Assessment/Plan:  35 y.o. female with pregnancy at 21 weeks of gestational age with symptomatic cholelithiasis.  Patient at this moment without any sign of cholecystitis but with significant symptoms from cholelithiasis.  I had a long discussion with the patient about the alternative of surgical management versus observation.  Both ultimately has significant risk.  Main risk of proceeding with surgery is premature delivery.  On the other hand if patient continue with pain using pain medication with nausea and vomiting and dehydration she can also have complication of her pregnancy.  After having additional discussion with the patient she really wants to avoid surgery if possible.  I recommended to optimize pain medication with hydrocodone and to give antiemetics standing for the  first 24 hours to see if patient is able to tolerate diet.  If the patient does not tolerate diet with persistent pain and nausea and vomiting I do think that proceeding with surgery will outweigh the risk including possible complication of cholecystitis.  I discussed with the case with obstetrician and they agree with plan.  They optimize the supportive management with improving pain medications and antiemetic medications.  They agree and are available in case patient need surgical management.  Gae Gallop, MD

## 2020-03-08 NOTE — Consult Note (Signed)
SURGICAL CONSULTATION NOTE   HISTORY OF PRESENT ILLNESS (HPI):  35 y.o. female presented to [facility] ED for evaluation of abdominal pain nausea and vomiting since 5 days ago. Patient reports she has been having abdominal pain associated with nausea or vomiting after eating and drinking anything since 5 days ago.  She reported that initially was with everything that she eats but she was able to tolerate liquids.  Now she is having abdominal pain nausea or vomiting even with small amount of liquids.  The pain is in the epigastric and right upper quadrant.  No pain radiation.  There has been no alleviating factors.  Aggravating factor is any oral intake.  Patient was admitted for observation by obstetrician.  Ultrasound was done showing significant amount of sludge.  Patient with normal leukocyte count for her pregnancy.  Patient is 88 weeks of gestation age.  Otherwise there has been no other complications during the pregnancy.  Surgery is consulted by Dr. Gaynelle Arabian in this context for evaluation and management of symptomatic cholelithiasis.  PAST MEDICAL HISTORY (PMH):  Past Medical History:  Diagnosis Date  . Anemia    Patient reports anemia with her pregnancies  . Infection    UTI  . Miscarriage   . Vaginal Pap smear, abnormal      PAST SURGICAL HISTORY (PSH):  Past Surgical History:  Procedure Laterality Date  . NO PAST SURGERIES       MEDICATIONS:  Prior to Admission medications   Medication Sig Start Date End Date Taking? Authorizing Provider  Doxylamine-Pyridoxine (DICLEGIS) 10-10 MG TBEC Take 1-2 tablets by mouth at bedtime. Start by taking two tablets at bedtime on day 1 and 2; if symptoms persist, take 1 tablet in morning and 2 tablets at bedtime on day 3; if symptoms persist, may increase to 1 tablet in morning, 1 tablet mid-afternoon, and 2 tablets at bedtime on day 4 (maximum: doxylamine 40 mg/pyridoxine 40 mg (4 tablets) per day). 12/03/19  Yes Edd Arbour R, CNM  ferrous  sulfate (FERROUSUL) 325 (65 FE) MG tablet Take 1 tablet (325 mg total) by mouth 2 (two) times daily. 02/26/20  Yes Veal, Konrad Felix, CNM  Prenatal Vit-Fe Fumarate-FA (MULTIVITAMIN-PRENATAL) 27-0.8 MG TABS tablet Take 1 tablet by mouth daily at 12 noon.    Yes [provider]  promethazine (PHENERGAN) 12.5 MG tablet Take 1 tablet (12.5 mg total) by mouth every 6 (six) hours as needed for nausea or vomiting. Take for breakthrough nausea when diclegis isn't working 12/03/19  Yes Edd Arbour R, CNM  simethicone (GAS-X) 80 MG chewable tablet Chew 1 tablet (80 mg total) by mouth every 6 (six) hours as needed for flatulence. 12/03/19  Yes Walker, Harvin Hazel R, CNM  nitrofurantoin, macrocrystal-monohydrate, (MACROBID) 100 MG capsule Take 1 capsule (100 mg total) by mouth 2 (two) times daily. Patient not taking: No sig reported 01/16/20   Mirna Mires, CNM     ALLERGIES:  No Known Allergies   SOCIAL HISTORY:  Social History   Socioeconomic History  . Marital status: Single    Spouse name: Not on file  . Number of children: 3  . Years of education: Not on file  . Highest education level: Not on file  Occupational History  . Not on file  Tobacco Use  . Smoking status: Former Games developer  . Smokeless tobacco: Never Used  . Tobacco comment: quit 2015  Vaping Use  . Vaping Use: Never used  Substance and Sexual Activity  . Alcohol use: Not Currently  Comment: 2-3 per week when not pregnant  . Drug use: Not Currently    Types: Marijuana    Comment: smoking when zofran not working,  last 10/3  . Sexual activity: Yes    Birth control/protection: None  Other Topics Concern  . Not on file  Social History Narrative  . Not on file   Social Determinants of Health   Financial Resource Strain: Not on file  Food Insecurity: Not on file  Transportation Needs: Not on file  Physical Activity: Not on file  Stress: Not on file  Social Connections: Not on file  Intimate Partner Violence: Not  At Risk  . Fear of Current or Ex-Partner: No  . Emotionally Abused: No  . Physically Abused: No  . Sexually Abused: No      FAMILY HISTORY:  Family History  Problem Relation Age of Onset  . Healthy Mother   . Healthy Father      REVIEW OF SYSTEMS:  Constitutional: denies weight loss, fever, chills, or sweats  Eyes: denies any other vision changes, history of eye injury  ENT: denies sore throat, hearing problems  Respiratory: denies shortness of breath, wheezing  Cardiovascular: denies chest pain, palpitations  Gastrointestinal: Positive abdominal pain, nausea and vomiting Genitourinary: denies burning with urination or urinary frequency Musculoskeletal: denies any other joint pains or cramps  Skin: denies any other rashes or skin discolorations  Neurological: denies any other headache, dizziness, weakness  Psychiatric: denies any other depression, anxiety   All other review of systems were negative   VITAL SIGNS:  Temp:  [97.6 F (36.4 C)-98.6 F (37 C)] 98 F (36.7 C) (01/08 1307) Pulse Rate:  [60-72] 63 (01/08 1307) Resp:  [14-18] 18 (01/08 1307) BP: (111-138)/(67-94) 138/94 (01/08 1307) SpO2:  [99 %] 99 % (01/08 1307)     Height: 5' (152.4 cm) Weight: 71.2 kg BMI (Calculated): 30.66   INTAKE/OUTPUT:  This shift: No intake/output data recorded.  Last 2 shifts: @IOLAST2SHIFTS @   PHYSICAL EXAM:  Constitutional:  -- Normal body habitus  -- Awake, alert, and oriented x3  Eyes:  -- Pupils equally round and reactive to light  -- No scleral icterus  Ear, nose, and throat:  -- No jugular venous distension  Pulmonary:  -- No crackles  -- Equal breath sounds bilaterally -- Breathing non-labored at rest Cardiovascular:  -- S1, S2 present  -- No pericardial rubs Gastrointestinal:  -- Abdomen soft, nontender, non-distended, no guarding or rebound tenderness --Gravidus with uterus palpated above the umbilical area Musculoskeletal and Integumentary:  -- Wounds: None  appreciated -- Extremities: B/L UE and LE FROM, hands and feet warm, no edema  Neurologic:  -- Motor function: intact and symmetric -- Sensation: intact and symmetric   Labs:  CBC Latest Ref Rng & Units 03/07/2020 02/25/2020 01/16/2020  WBC 4.0 - 10.5 K/uL 14.7(H) 11.2(H) 16.4(H)  Hemoglobin 12.0 - 15.0 g/dL 10.4(L) 9.0(L) 8.6(L)  Hematocrit 36.0 - 46.0 % 31.2(L) 27.0(L) 25.4(L)  Platelets 150 - 400 K/uL 234 284 202   CMP Latest Ref Rng & Units 03/08/2020 03/08/2020 03/07/2020  Glucose 70 - 99 mg/dL 81 85 94  BUN 6 - 20 mg/dL 05/05/2020) 6 9  Creatinine <2(T - 1.00 mg/dL 5.57) 3.22(G) <2.54(Y)  Sodium 135 - 145 mmol/L 133(L) 134(L) 133(L)  Potassium 3.5 - 5.1 mmol/L 3.7 3.1(L) 2.8(L)  Chloride 98 - 111 mmol/L 101 100 97(L)  CO2 22 - 32 mmol/L 20(L) 21(L) 22  Calcium 8.9 - 10.3 mg/dL 7.06(C) 3.7(S) 9.4  Total Protein 6.5 - 8.1 g/dL - 6.7 8.1  Total Bilirubin 0.3 - 1.2 mg/dL - 2.7(H) 2.8(H)  Alkaline Phos 38 - 126 U/L - 54 60  AST 15 - 41 U/L - 30 33  ALT 0 - 44 U/L - 38 37     Imaging studies:  I personally evaluated the images of the ultrasound.  There is significant sludge identified.  No pericholecystic fluid.  Assessment/Plan:  34 y.o. female with pregnancy at 31 weeks of gestational age with symptomatic cholelithiasis.  Patient at this moment without any sign of cholecystitis but with significant symptoms from cholelithiasis.  I had a long discussion with the patient about the alternative of surgical management versus observation.  Both ultimately has significant risk.  Main risk of proceeding with surgery is premature delivery.  On the other hand if patient continue with pain using pain medication with nausea and vomiting and dehydration she can also have complication of her pregnancy.  After having additional discussion with the patient she really wants to avoid surgery if possible.  I recommended to optimize pain medication with hydrocodone and to give antiemetics standing for the  first 24 hours to see if patient is able to tolerate diet.  If the patient does not tolerate diet with persistent pain and nausea and vomiting I do think that proceeding with surgery will outweigh the risk including possible complication of cholecystitis.  I discussed with the case with obstetrician and they agree with plan.  They optimize the supportive management with improving pain medications and antiemetic medications.  They agree and are available in case patient need surgical management.  Vetta Couzens Cintrn-Daz, MD  

## 2020-03-08 NOTE — Progress Notes (Signed)
Spoke with Dr. Tiburcio Pea and Dr. Hazle Quant,  Patient called this RN into patient room and stated that she would like to proceed with procedure.  Dr. Tiburcio Pea in to speak with patient, Dr. Hazle Quant acknowledged plan and will place patient NPO after midnight.

## 2020-03-09 ENCOUNTER — Inpatient Hospital Stay: Payer: Medicaid Other | Admitting: Anesthesiology

## 2020-03-09 ENCOUNTER — Encounter
Admission: EM | Disposition: A | Discharge status: Home / Self Care | Payer: Self-pay | Source: Home / Self Care | Attending: Obstetrics & Gynecology

## 2020-03-09 DIAGNOSIS — K819 Cholecystitis, unspecified: Secondary | ICD-10-CM | POA: Diagnosis not present

## 2020-03-09 SURGERY — CHOLECYSTECTOMY, ROBOT-ASSISTED, LAPAROSCOPIC
Anesthesia: General

## 2020-03-09 MED ORDER — FENTANYL CITRATE (PF) 100 MCG/2ML IJ SOLN
INTRAMUSCULAR | Status: AC
  Filled 2020-03-09: qty 2

## 2020-03-09 MED ORDER — PROPOFOL 10 MG/ML IV BOLUS
INTRAVENOUS | Status: DC | PRN
  Administered 2020-03-09: 150 mg via INTRAVENOUS

## 2020-03-09 MED ORDER — ESMOLOL HCL 100 MG/10ML IV SOLN
INTRAVENOUS | Status: DC | PRN
  Administered 2020-03-09 (×4): 10 mg via INTRAVENOUS

## 2020-03-09 MED ORDER — PROPOFOL 500 MG/50ML IV EMUL
INTRAVENOUS | Status: DC | PRN
  Administered 2020-03-09: 125 ug/kg/min via INTRAVENOUS

## 2020-03-09 MED ORDER — FENTANYL CITRATE (PF) 100 MCG/2ML IJ SOLN
INTRAMUSCULAR | Status: AC
  Administered 2020-03-09: 100 ug via DENTAL
  Filled 2020-03-09: qty 2

## 2020-03-09 MED ORDER — MIDAZOLAM HCL 2 MG/2ML IJ SOLN
INTRAMUSCULAR | Status: DC | PRN
  Administered 2020-03-09: 1 mg via INTRAVENOUS

## 2020-03-09 MED ORDER — SUCCINYLCHOLINE CHLORIDE 200 MG/10ML IV SOSY
PREFILLED_SYRINGE | INTRAVENOUS | Status: AC
  Filled 2020-03-09: qty 10

## 2020-03-09 MED ORDER — SUCCINYLCHOLINE CHLORIDE 20 MG/ML IJ SOLN
INTRAMUSCULAR | Status: DC | PRN
  Administered 2020-03-09: 100 mg via INTRAVENOUS

## 2020-03-09 MED ORDER — SOD CITRATE-CITRIC ACID 500-334 MG/5ML PO SOLN
15.0000 mL | Freq: Once | ORAL | Status: AC
  Administered 2020-03-09: 15 mL via ORAL

## 2020-03-09 MED ORDER — ONDANSETRON HCL 4 MG/2ML IJ SOLN
INTRAMUSCULAR | Status: DC | PRN
  Administered 2020-03-09: 4 mg via INTRAVENOUS

## 2020-03-09 MED ORDER — DEXAMETHASONE SODIUM PHOSPHATE 10 MG/ML IJ SOLN
INTRAMUSCULAR | Status: DC | PRN
  Administered 2020-03-09: 10 mg via INTRAVENOUS

## 2020-03-09 MED ORDER — PROPOFOL 500 MG/50ML IV EMUL
INTRAVENOUS | Status: AC
  Filled 2020-03-09: qty 50

## 2020-03-09 MED ORDER — SUGAMMADEX SODIUM 200 MG/2ML IV SOLN
INTRAVENOUS | Status: DC | PRN
  Administered 2020-03-09: 200 mg via INTRAVENOUS

## 2020-03-09 MED ORDER — ROCURONIUM BROMIDE 100 MG/10ML IV SOLN
INTRAVENOUS | Status: DC | PRN
  Administered 2020-03-09: 20 mg via INTRAVENOUS
  Administered 2020-03-09: 50 mg via INTRAVENOUS

## 2020-03-09 MED ORDER — DEXAMETHASONE SODIUM PHOSPHATE 10 MG/ML IJ SOLN
INTRAMUSCULAR | Status: AC
  Filled 2020-03-09: qty 1

## 2020-03-09 MED ORDER — ESMOLOL HCL 100 MG/10ML IV SOLN
INTRAVENOUS | Status: AC
  Filled 2020-03-09: qty 10

## 2020-03-09 MED ORDER — ACETAMINOPHEN 10 MG/ML IV SOLN
INTRAVENOUS | Status: AC
  Filled 2020-03-09: qty 100

## 2020-03-09 MED ORDER — ONDANSETRON HCL 4 MG/2ML IJ SOLN: 4.0000 mg | Freq: Once | INTRAMUSCULAR | Status: DC | PRN

## 2020-03-09 MED ORDER — OXYCODONE HCL 5 MG/5ML PO SOLN
5.0000 mg | Freq: Once | ORAL | Status: DC | PRN
Start: 2020-03-09 — End: 2020-03-09

## 2020-03-09 MED ORDER — FENTANYL CITRATE (PF) 100 MCG/2ML IJ SOLN
25.0000 ug | INTRAMUSCULAR | Status: DC | PRN
  Administered 2020-03-09 (×3): 25 ug via INTRAVENOUS

## 2020-03-09 MED ORDER — CEFAZOLIN SODIUM-DEXTROSE 2-3 GM-%(50ML) IV SOLR
INTRAVENOUS | Status: DC | PRN
  Administered 2020-03-09: 2 g via INTRAVENOUS

## 2020-03-09 MED ORDER — BUPIVACAINE-EPINEPHRINE 0.25% -1:200000 IJ SOLN
INTRAMUSCULAR | Status: DC | PRN
  Administered 2020-03-09: 13 mL

## 2020-03-09 MED ORDER — LIDOCAINE HCL (PF) 2 % IJ SOLN
INTRAMUSCULAR | Status: AC
  Filled 2020-03-09: qty 5

## 2020-03-09 MED ORDER — PROPOFOL 10 MG/ML IV BOLUS
INTRAVENOUS | Status: AC
  Filled 2020-03-09: qty 20

## 2020-03-09 MED ORDER — MIDAZOLAM HCL 2 MG/2ML IJ SOLN
INTRAMUSCULAR | Status: AC
  Filled 2020-03-09: qty 2

## 2020-03-09 MED ORDER — MORPHINE SULFATE (PF) 4 MG/ML IV SOLN
INTRAVENOUS | Status: AC
  Administered 2020-03-09: 4 mg via INTRAVENOUS
  Filled 2020-03-09: qty 1

## 2020-03-09 MED ORDER — LACTATED RINGERS IV SOLN: INTRAVENOUS | Status: DC | PRN

## 2020-03-09 MED ORDER — ONDANSETRON HCL 4 MG/2ML IJ SOLN
INTRAMUSCULAR | Status: AC
  Filled 2020-03-09: qty 2

## 2020-03-09 MED ORDER — ACETAMINOPHEN 10 MG/ML IV SOLN
INTRAVENOUS | Status: DC | PRN
  Administered 2020-03-09: 1000 mg via INTRAVENOUS

## 2020-03-09 MED ORDER — ROCURONIUM BROMIDE 10 MG/ML (PF) SYRINGE
PREFILLED_SYRINGE | INTRAVENOUS | Status: AC
  Filled 2020-03-09: qty 10

## 2020-03-09 MED ORDER — LIDOCAINE HCL (CARDIAC) PF 100 MG/5ML IV SOSY
PREFILLED_SYRINGE | INTRAVENOUS | Status: DC | PRN
  Administered 2020-03-09: 80 mg via INTRAVENOUS

## 2020-03-09 MED ORDER — MORPHINE SULFATE (PF) 4 MG/ML IV SOLN
4.0000 mg | INTRAVENOUS | Status: DC | PRN
  Administered 2020-03-09 – 2020-03-10 (×4): 4 mg via INTRAVENOUS
  Filled 2020-03-09 (×4): qty 1

## 2020-03-09 MED ORDER — OXYCODONE HCL 5 MG PO TABS: 5.0000 mg | ORAL_TABLET | Freq: Once | ORAL | Status: DC | PRN

## 2020-03-09 MED ORDER — FENTANYL CITRATE (PF) 100 MCG/2ML IJ SOLN
INTRAMUSCULAR | Status: DC | PRN
  Administered 2020-03-09: 50 ug via INTRAVENOUS
  Administered 2020-03-09 (×2): 25 ug via INTRAVENOUS
  Administered 2020-03-09: 100 ug via INTRAVENOUS
  Administered 2020-03-09 (×2): 50 ug via INTRAVENOUS

## 2020-03-09 MED ORDER — ACETAMINOPHEN 10 MG/ML IV SOLN: 1000.0000 mg | Freq: Once | INTRAVENOUS | Status: DC | PRN

## 2020-03-09 SURGICAL SUPPLY — 50 items
BAG INFUSER PRESSURE 100CC (MISCELLANEOUS) IMPLANT
BLADE SURG SZ11 CARB STEEL (BLADE) ×2 IMPLANT
CANNULA REDUC XI 12-8 STAPL (CANNULA) ×1
CANNULA REDUCER 12-8 DVNC XI (CANNULA) ×1 IMPLANT
CHLORAPREP W/TINT 26 (MISCELLANEOUS) ×2 IMPLANT
CLIP VESOLOCK MED LG 6/CT (CLIP) ×2 IMPLANT
COVER WAND RF STERILE (DRAPES) ×2 IMPLANT
DECANTER SPIKE VIAL GLASS SM (MISCELLANEOUS) IMPLANT
DEFOGGER SCOPE WARMER CLEARIFY (MISCELLANEOUS) ×2 IMPLANT
DERMABOND ADVANCED (GAUZE/BANDAGES/DRESSINGS) ×1
DERMABOND ADVANCED .7 DNX12 (GAUZE/BANDAGES/DRESSINGS) ×1 IMPLANT
DRAPE ARM DVNC X/XI (DISPOSABLE) ×4 IMPLANT
DRAPE COLUMN DVNC XI (DISPOSABLE) ×1 IMPLANT
DRAPE DA VINCI XI ARM (DISPOSABLE) ×4
DRAPE DA VINCI XI COLUMN (DISPOSABLE) ×1
ELECT REM PT RETURN 9FT ADLT (ELECTROSURGICAL) ×2
ELECTRODE REM PT RTRN 9FT ADLT (ELECTROSURGICAL) ×1 IMPLANT
GLOVE SURG ENC MOIS LTX SZ6.5 (GLOVE) ×4 IMPLANT
GLOVE SURG UNDER POLY LF SZ6.5 (GLOVE) ×4 IMPLANT
GOWN STRL REUS W/ TWL LRG LVL3 (GOWN DISPOSABLE) ×3 IMPLANT
GOWN STRL REUS W/TWL LRG LVL3 (GOWN DISPOSABLE) ×3
GRASPER SUT TROCAR 14GX15 (MISCELLANEOUS) IMPLANT
IRRIGATOR SUCT 8 DISP DVNC XI (IRRIGATION / IRRIGATOR) IMPLANT
IRRIGATOR SUCTION 8MM XI DISP (IRRIGATION / IRRIGATOR)
IV NS 1000ML (IV SOLUTION)
IV NS 1000ML BAXH (IV SOLUTION) IMPLANT
KIT PINK PAD W/HEAD ARE REST (MISCELLANEOUS) ×2
KIT PINK PAD W/HEAD ARM REST (MISCELLANEOUS) ×1 IMPLANT
LABEL OR SOLS (LABEL) ×2 IMPLANT
MANIFOLD NEPTUNE II (INSTRUMENTS) ×2 IMPLANT
NEEDLE HYPO 22GX1.5 SAFETY (NEEDLE) ×2 IMPLANT
NEEDLE INSUFFLATION 14GA 120MM (NEEDLE) ×2 IMPLANT
NS IRRIG 500ML POUR BTL (IV SOLUTION) ×2 IMPLANT
OBTURATOR OPTICAL STANDARD 8MM (TROCAR) ×1
OBTURATOR OPTICAL STND 8 DVNC (TROCAR) ×1
OBTURATOR OPTICALSTD 8 DVNC (TROCAR) ×1 IMPLANT
PACK LAP CHOLECYSTECTOMY (MISCELLANEOUS) ×2 IMPLANT
POUCH SPECIMEN RETRIEVAL 10MM (ENDOMECHANICALS) ×2 IMPLANT
SEAL CANN UNIV 5-8 DVNC XI (MISCELLANEOUS) ×3 IMPLANT
SEAL XI 5MM-8MM UNIVERSAL (MISCELLANEOUS) ×3
SET TUBE SMOKE EVAC HIGH FLOW (TUBING) ×2 IMPLANT
SOLUTION ELECTROLUBE (MISCELLANEOUS) ×2 IMPLANT
STAPLER CANNULA SEAL DVNC XI (STAPLE) ×1 IMPLANT
STAPLER CANNULA SEAL XI (STAPLE) ×1
SUT MNCRL 4-0 (SUTURE) ×1
SUT MNCRL 4-0 27XMFL (SUTURE) ×1
SUT VIC AB 2-0 SH 27 (SUTURE) ×1
SUT VIC AB 2-0 SH 27XBRD (SUTURE) ×1 IMPLANT
SUT VICRYL 0 AB UR-6 (SUTURE) IMPLANT
SUTURE MNCRL 4-0 27XMF (SUTURE) ×1 IMPLANT

## 2020-03-09 NOTE — Anesthesia Postprocedure Evaluation (Signed)
Anesthesia Post Note  Patient: Nikol Lemar  Procedure(s) Performed: XI ROBOTIC ASSISTED LAPAROSCOPIC CHOLECYSTECTOMY (N/A )  Patient location during evaluation: PACU Anesthesia Type: General Level of consciousness: awake and alert Pain management: pain level controlled Vital Signs Assessment: post-procedure vital signs reviewed and stable Respiratory status: spontaneous breathing, nonlabored ventilation, respiratory function stable and patient connected to nasal cannula oxygen Cardiovascular status: blood pressure returned to baseline and stable Postop Assessment: no apparent nausea or vomiting Anesthetic complications: no Comments: Post op fetal heart tones detected by OB. Reassuring.   No complications documented.   Last Vitals:  Vitals:   03/09/20 1109 03/09/20 1118  BP: 124/65 124/65  Pulse: 85 79  Resp: 14 16  Temp: 36.8 C 36.8 C  SpO2: 100% 100%    Last Pain:  Vitals:   03/09/20 1109  TempSrc: Temporal  PainSc: 9                  Corinda Gubler

## 2020-03-09 NOTE — Progress Notes (Signed)
Arrived via bed to PACU from OR aawake and Ox3  Fetal heart rate 136 upon arrival to PACU, assessed by [facility] RN

## 2020-03-09 NOTE — Transfer of Care (Signed)
Immediate Anesthesia Transfer of Care Note  Patient: Breanna Webb  Procedure(s) Performed: XI ROBOTIC ASSISTED LAPAROSCOPIC CHOLECYSTECTOMY (N/A )  Patient Location: PACU  Anesthesia Type:General  Level of Consciousness: awake, alert  and oriented  Airway & Oxygen Therapy: Patient Spontanous Breathing and Patient connected to face mask oxygen  Post-op Assessment: Report given to RN and Post -op Vital signs reviewed and stable  Post vital signs: Reviewed and stable  Last Vitals:  Vitals Value Taken Time  BP 124/65 03/09/20 1118  Temp 36.8 C 03/09/20 1118  Pulse 78 03/09/20 1118  Resp 14 03/09/20 1118  SpO2 100 % 03/09/20 1118  Vitals shown include unvalidated device data.  Last Pain:  Vitals:   03/09/20 1109  TempSrc: Temporal  PainSc: 9       Patients Stated Pain Goal: 0 (03/09/20 0900)  Complications: No complications documented.  OB nurse at bedside to assess FHT, 136.

## 2020-03-09 NOTE — Progress Notes (Signed)
Pt seen, discussed how she is doing after surgery (robotic lap chole) She reports pain in mid abdomen, no radiation She is alert and oriented, no other concerns FHT 140s in PACU  Cont monitoring for post op pain relief and progress NST this pm Consider d/c (in am) when tolerating po's and pain mgt  Annamarie Major, MD, [facility], [facility] 03/09/2020  12:48 PM

## 2020-03-09 NOTE — Interval H&P Note (Signed)
History and Physical Interval Note:  03/09/2020 9:17 AM  Breanna Webb  has presented today for surgery, with the diagnosis of cholesytitis.  The pais was not able to be improved with aggressive supportive management (pain medications, IVF's, nausea medications). The patient has elected to proceed with cholecystectomy. The various methods of treatment have been discussed with the patient and family. After consideration of risks, benefits and other options for treatment, the patient has consented to  Procedure(s): XI ROBOTIC ASSISTED LAPAROSCOPIC CHOLECYSTECTOMY (N/A) as a surgical intervention.  The patient's history has been reviewed, patient examined, no change in status, stable for surgery.  I have reviewed the patient's chart and labs.  Questions were answered to the patient's satisfaction.     Carolan Shiver

## 2020-03-09 NOTE — Progress Notes (Signed)
Pt complains that she is up to BR frequently, is having increased pain with movement. Asked if we can D/C iv fluids. Paged MD Spoke with Dr. Maia Plan, can decrease fluids to 50 ml/hr as long as PO intake is adequate. If pt becomes nauseous or vomiting returns, should increase fluid rate to 75 ml/hr. Pt agreeable to current plan, reports pain is better, rates 0/10 when not moving, but "5 or 6 when I get up".

## 2020-03-09 NOTE — Progress Notes (Signed)
Pt transported to OR/Pre-op. Report update given to [facility], Charity fundraiser.

## 2020-03-09 NOTE — Anesthesia Preprocedure Evaluation (Signed)
Anesthesia Evaluation  Patient identified by MRN, date of birth, ID band Patient awake  General Assessment Comment:[redacted] week pregnant Patient with symptomatic cholelithiasis causing inability to tolerate PO, severe nausea/vomiting. After careful deliberation between patient, OB, and surgical team, it was decided to proceed with surgery. Has not had anesthesia before  Reviewed: Allergy & Precautions, NPO status , Patient's Chart, lab work & pertinent test results  History of Anesthesia Complications Negative for: history of anesthetic complications  Airway Mallampati: III  TM Distance: >3 FB Neck ROM: Full    Dental no notable dental hx. (+) Teeth Intact   Pulmonary neg pulmonary ROS, neg sleep apnea, neg COPD, Patient abstained from smoking.Not current smoker, former smoker,    Pulmonary exam normal breath sounds clear to auscultation       Cardiovascular Exercise Tolerance: Good METS(-) hypertension(-) CAD and (-) Past MI negative cardio ROS  (-) dysrhythmias  Rhythm:Regular Rate:Normal - Systolic murmurs    Neuro/Psych negative neurological ROS  negative psych ROS   GI/Hepatic neg GERD  ,(+)     (-) substance abuse  ,   Endo/Other  neg diabetes  Renal/GU negative Renal ROS     Musculoskeletal   Abdominal   Peds  Hematology  (+) anemia ,   Anesthesia Other Findings Past Medical History: No date: Anemia     Comment:  Patient reports anemia with her pregnancies No date: Infection     Comment:  UTI No date: Miscarriage No date: Vaginal Pap smear, abnormal  Reproductive/Obstetrics (+) Pregnancy                             Anesthesia Physical Anesthesia Plan  ASA: II  Anesthesia Plan: General   Post-op Pain Management:    Induction: Intravenous and Rapid sequence  PONV Risk Score and Plan: 4 or greater and Ondansetron, Dexamethasone, Propofol infusion, TIVA and Treatment may vary  due to age or medical condition  Airway Management Planned: Oral ETT  Additional Equipment: None  Intra-op Plan:   Post-operative Plan: Extubation in OR  Informed Consent: I have reviewed the patients History and Physical, chart, labs and discussed the procedure including the risks, benefits and alternatives for the proposed anesthesia with the patient or authorized representative who has indicated his/her understanding and acceptance.     Dental advisory given  Plan Discussed with: CRNA and Surgeon  Anesthesia Plan Comments: (Discussed risks of anesthesia with patient, including PONV, sore throat, lip/dental damage. Rare risks discussed as well, such as cardiorespiratory and neurological sequelae. Patient understands.  Patient also counseled on pregnancy related risks such as aspiration, and preterm labor and loss of pregnancy. I spoke on phone with Dr. Tiburcio Pea (OB) whose monitoring plan was to perform preop and postop NSTs. I discussed with him the potential option of continuous intraoperative monitoring, given the gestational age and viability of fetus. He understood my thoughts and concerns and he will proceed as he originally planned.)        Anesthesia Quick Evaluation

## 2020-03-09 NOTE — Op Note (Signed)
Preoperative diagnosis: Acute cholecystitis  Postoperative diagnosis: Acute cholecystitis  Procedure: Robotic Assisted Laparoscopic Cholecystectomy.   Anesthesia: GETA   Surgeon: Dr. Hazle Quant  Wound Classification: Clean Contaminated  Indications: Patient is a 35 y.o. female developed right upper quadrant pain, nausea and vomiting and workup showed cholelithiasis with a normal common duct. Pain and vomiting unable to be controlled with medical management. Patient has not been able to tolerate any oral intake (fluid or solid) since 6 days ago. Robotic Assisted Laparoscopic cholecystectomy was elected.  Findings: No injury to uterus during surgery:   Critical view of safety achieved:   Cystic duct and artery identified, ligated and divided Adequate hemostasis  Description of procedure: The patient was placed on the operating table in the supine position. General anesthesia was induced. A time-out was completed verifying correct patient, procedure, site, positioning, and implant(s) and/or special equipment prior to beginning this procedure. An orogastric tube was placed. The abdomen was prepped and draped in the usual sterile fashion.  An incision was made in a natural skin in the left upper quadrant. Using a Hasson technique, the a 12 mm trocar was inserted.  The abdomen was insufflated with carbon dioxide to a pressure of 10 mmHg. The patient tolerated insufflation well. Three additional 8-mm trocars were inserted under direct visualization.   The abdomen was inspected, the uterus was identified very closed to the upper abdomen making this surgery very difficult and time consuming due to the limited space. There was no other gross abnormalities. The table was placed in the reverse Trendelenburg position with the right side up. The robotic arms were docked and target anatomy identified. Instrument inserted under direct visualization.  Filmy adhesions between the gallbladder and omentum,  duodenum and transverse colon were lysed with electrocautery. The dome of the gallbladder was grasped with a prograsp and retracted over the dome of the liver. The infundibulum was also grasped with an atraumatic grasper and retracted toward the right lower quadrant. This maneuver exposed Calot's triangle. The peritoneum overlying the gallbladder infundibulum was then incised and the cystic duct and cystic artery identified and circumferentially dissected. Critical view of safety reviewed before ligating any structure. Firefly images taken to visualize biliary ducts. The cystic duct and cystic artery were then doubly clipped and divided close to the gallbladder.  The gallbladder was then dissected from its peritoneal attachments by electrocautery. Hemostasis was checked and the gallbladder and contained stones were removed using an endoscopic retrieval bag. The gallbladder was passed off the table as a specimen.  There was no evidence of bleeding from the gallbladder fossa or cystic artery or leakage of the bile from the cystic duct stump. Secondary trocars were removed under direct vision. No bleeding was noted. The robotic arms were undoked. The scope was withdrawn and the umbilical trocar removed. The abdomen was allowed to collapse. The fascia of the 66mm trocar sites was closed with figure-of-eight 0 vicryl sutures. The skin was closed with subcuticular sutures of 4-0 monocryl and topical skin adhesive. The orogastric tube was removed.  The patient tolerated the procedure well and was taken to the postanesthesia care unit in stable condition.   Specimen: Gallbladder  Complications: None  EBL: 5 mL

## 2020-03-09 NOTE — Progress Notes (Signed)
Benign Gynecology Progress Note  Admission Date: 03/07/2020 Current Date: 03/09/2020  Breanna Webb is a 35 y.o. [id number] HD#2 @ [redacted]w[redacted]d.  History complicated by: Patient Active Problem List   Diagnosis Date Noted  . Gallbladder disease affecting pregnancy in third trimester 03/08/2020  . Recurrent biliary colic   . Pregnancy 03/07/2020  . [redacted] weeks gestation of pregnancy 03/07/2020  . Anemia during pregnancy in third trimester 02/26/2020  . Abdominal pain in pregnancy, third trimester 01/16/2020  . Abnormal gallbladder ultrasound 12/13/2019  . Trichomonas vaginitis 10/29/2019  . Supervision of other normal pregnancy, antepartum 10/15/2019   ROS and patient/family/surgical history, located on admission H&P note dated 03/07/2020, have been reviewed, and there are no changes except as noted below  Yesterday/Overnight Events:  No new sturies Subjective:  Nausea still prevalent, pt decided on surgery D/W pt pros and cons of surgery being pregnant Plans this am Objective:    Temp:  [97.6 F (36.4 C)-98.5 F (36.9 C)] 98.1 F (36.7 C) (01/08 2352) Pulse Rate:  [60-68] 61 (01/08 2352) Resp:  [14-18] 18 (01/08 2352) BP: (109-138)/(67-94) 116/70 (01/08 2352) SpO2:  [99 %] 99 % (01/08 2352) I/O last 3 completed shifts: In: 2586.9 [I.V.:2586.9] Out: 200 [Emesis/NG output:200] No intake/output data recorded.  Intake/Output Summary (Last 24 hours) at 03/09/2020 0428 Last data filed at 03/08/2020 1825 Gross per 24 hour  Intake 2586.88 ml  Output 200 ml  Net 2386.88 ml  Physical exam: General appearance: alert, cooperative and appears stated age Abdomen: soft, ND, gravid, mild RUQ T GU: No gross VB Lungs: clear to auscultation bilaterally Heart: regular rate and rhythm and no MRGs Extremities: no redness or tenderness in the calves or thighs, no edema Skin: no lesions Psych: appropriate  Recent Labs  Lab 03/07/20 1337 03/08/20 0028 03/08/20 0738  NA 133* 134* 133*  K 2.8* 3.1*  3.7  CL 97* 100 101  CO2 22 21* 20*  BUN 9 6 <5*  CREATININE 0.39* <0.30* 0.32*  GLUCOSE 94 85 81   Recent Labs  Lab 03/07/20 1338  WBC 14.7*  HGB 10.4*  HCT 31.2*  PLT 234    Recent Labs  Lab 03/07/20 1337 03/08/20 0028 03/08/20 0738  CALCIUM 9.4 8.7* 8.8*   No results for input(s): INR, APTT in the last 168 hours.    Recent Labs  Lab 03/07/20 1337 03/08/20 0028  ALKPHOS 60 54  BILITOT 2.8* 2.7*  BILIDIR  --  0.9*  PROT 8.1 6.7  ALT 37 38  AST 33 30    Recent Labs  Lab 03/07/20 1338  WBC 14.7*  HGB 10.4*  HCT 31.2*  PLT 234   Recent Labs  Lab 03/07/20 1337 03/08/20 0028 03/08/20 0738  NA 133* 134* 133*  K 2.8* 3.1* 3.7  CL 97* 100 101  CO2 22 21* 20*  BUN 9 6 <5*  CREATININE 0.39* <0.30* 0.32*  CALCIUM 9.4 8.7* 8.8*  PROT 8.1 6.7  --   BILITOT 2.8* 2.7*  --   ALKPHOS 60 54  --   ALT 37 38  --   AST 33 30  --   GLUCOSE 94 85 81    Radiology No new scans  Assessment & Plan:   Problem List Items Addressed This Visit      Other   Supervision of other normal pregnancy, antepartum   Recurrent biliary colic     Gen surgery to plan on cholecystectomy today NST before and after Monitor pain and nausea Advance diet  as able after surgery  Annamarie Major, MD, [facility], [facility] 03/09/2020  4:31 AM

## 2020-03-09 NOTE — Progress Notes (Signed)
Report given to [place], [place] in pre-op area. Pt currently in L&D having NST done.

## 2020-03-09 NOTE — Anesthesia Procedure Notes (Signed)
Procedure Name: Intubation Date/Time: 03/09/2020 9:49 AM Performed by: Karoline Caldwell, CRNA Pre-anesthesia Checklist: Patient identified, Patient being monitored, Timeout performed, Emergency Drugs available and Suction available Patient Re-evaluated:Patient Re-evaluated prior to induction Oxygen Delivery Method: Circle system utilized Preoxygenation: Pre-oxygenation with 100% oxygen Induction Type: IV induction and Rapid sequence Laryngoscope Size: 3 and McGraph Grade View: Grade I Tube type: Oral Tube size: 6.5 mm Number of attempts: 1 Airway Equipment and Method: Stylet Placement Confirmation: ETT inserted through vocal cords under direct vision,  positive ETCO2 and breath sounds checked- equal and bilateral Secured at: 20 cm Tube secured with: Tape Dental Injury: Teeth and Oropharynx as per pre-operative assessment

## 2020-03-10 ENCOUNTER — Encounter: Payer: Medicaid Other | Admitting: Obstetrics

## 2020-03-10 DIAGNOSIS — O26613 Liver and biliary tract disorders in pregnancy, third trimester: Secondary | ICD-10-CM | POA: Diagnosis not present

## 2020-03-10 DIAGNOSIS — Z3A31 31 weeks gestation of pregnancy: Secondary | ICD-10-CM | POA: Diagnosis not present

## 2020-03-10 DIAGNOSIS — K8050 Calculus of bile duct without cholangitis or cholecystitis without obstruction: Secondary | ICD-10-CM | POA: Diagnosis not present

## 2020-03-10 MED ORDER — ONDANSETRON HCL 4 MG PO TABS: 4.0000 mg | ORAL_TABLET | Freq: Four times a day (QID) | ORAL | 0 refills | Status: DC

## 2020-03-10 MED ORDER — HYDROCODONE-ACETAMINOPHEN 5-325 MG PO TABS: 1.0000 | ORAL_TABLET | Freq: Four times a day (QID) | ORAL | 0 refills | Status: AC | PRN

## 2020-03-10 NOTE — Progress Notes (Signed)
Patient discharged home with family. Discharge instructions and prescriptions given and reviewed with patient. Patient verbalized understanding. Escorted out by auxillary.  

## 2020-03-10 NOTE — Progress Notes (Signed)
SURGICAL PROGRESS NOTE   Hospital Day(s): 2.   Post op day(s): 1 Day Post-Op.   Interval History: Patient seen and examined, no acute events or new complaints overnight. Patient reports feeling well.  She reports that she has been able to tolerate diet without pain.  At the moment of my evaluation patient is eating eggs, toast and bacon.  Vital signs in last 24 hours: [min-max] current  Temp:  [97.5 F (36.4 C)-98.8 F (37.1 C)] 98.2 F (36.8 C) (01/10 0926) Pulse Rate:  [61-85] 61 (01/10 0926) Resp:  [14-24] 17 (01/10 0926) BP: (101-124)/(65-90) 120/81 (01/10 0926) SpO2:  [97 %-100 %] 99 % (01/10 0926)     Height: 5' (152.4 cm) Weight: 71.2 kg BMI (Calculated): 30.66   Physical Exam:  Constitutional: alert, cooperative and no distress  Respiratory: breathing non-labored at rest  Cardiovascular: regular rate and sinus rhythm  Gastrointestinal: soft, non-tender, and non-distended.  Wounds are dry and clean  Labs:  CBC Latest Ref Rng & Units 03/07/2020 02/25/2020 01/16/2020  WBC 4.0 - 10.5 K/uL 14.7(H) 11.2(H) 16.4(H)  Hemoglobin 12.0 - 15.0 g/dL 10.4(L) 9.0(L) 8.6(L)  Hematocrit 36.0 - 46.0 % 31.2(L) 27.0(L) 25.4(L)  Platelets 150 - 400 K/uL 234 284 202   CMP Latest Ref Rng & Units 03/08/2020 03/08/2020 03/07/2020  Glucose 70 - 99 mg/dL 81 85 94  BUN 6 - 20 mg/dL <4(E) 6 9  Creatinine 3.15 - 1.00 mg/dL 4.00(Q) <6.76(P) 9.50(D)  Sodium 135 - 145 mmol/L 133(L) 134(L) 133(L)  Potassium 3.5 - 5.1 mmol/L 3.7 3.1(L) 2.8(L)  Chloride 98 - 111 mmol/L 101 100 97(L)  CO2 22 - 32 mmol/L 20(L) 21(L) 22  Calcium 8.9 - 10.3 mg/dL 3.2(I) 7.1(I) 9.4  Total Protein 6.5 - 8.1 g/dL - 6.7 8.1  Total Bilirubin 0.3 - 1.2 mg/dL - 2.7(H) 2.8(H)  Alkaline Phos 38 - 126 U/L - 54 60  AST 15 - 41 U/L - 30 33  ALT 0 - 44 U/L - 38 37    Imaging studies: No new pertinent imaging studies   Assessment/Plan:  35 y.o. female with pregnancy at 5 weeks of gestational age with symptomatic cholelithiasis s/p  robotic assisted laparoscopic cholecystectomy postoperative day #1.  Patient recovering adequately.  She has been able to tolerate diet without pain.  There is no nausea or vomiting.  Since patient is able to tolerate diet and maintain hydrated from surgical standpoint patient can be discharged if obstetrician service agree.  At this moment there has been no issues with the pregnancy.  I will follow the patient in my office in 2 weeks.  Gae Gallop, MD

## 2020-03-10 NOTE — Progress Notes (Signed)
Pt transferred to OBS 3 for NST. Monitors applied and explained, pt reports good fetal movement. Denies contractions, bleeding, or LOF

## 2020-03-10 NOTE — Progress Notes (Signed)
Pt is ready for discharge. Paperwork completed and f/u appointments made for OB and General Surgery. Pt waiting for ride to pick her up this evening. Pt drove self here but cannot drive home due to surgery within last 24 hour and taking narcotic pain medication- pt educated.

## 2020-03-10 NOTE — Discharge Summary (Addendum)
DC Summary Discharge Summary   Patient ID: Breanna Webb [id number] 35 y.o. 10/15/1985  Admit date: 03/07/2020  Discharge date: 03/10/2020  Principal Diagnoses:  Abdominal pain in pregnancy, third trimester Pregnancy [redacted] weeks gestation of pregnancy Recurrent biliary colic  Secondary Diagnoses:  Gallbladder disease affecting pregnancy in third trimester Acute cholecystitis  Procedures performed during the hospitalization:  EXAM: ULTRASOUND ABDOMEN LIMITED RIGHT UPPER QUADRANT COMPARISON:  None. FINDINGS: Gallbladder: Significant sludge filling the gallbladder. Multiple non mobile echogenic foci. One of these was seen previously measuring 6 mm in November 2021 5 mm today. No wall thickening, Murphy's sign, pericholecystic fluid, or shadowing stones. Common bile duct: Diameter: 2.9 mm Liver: No focal lesion identified. Within normal limits in parenchymal echogenicity. Portal vein is patent on color Doppler imaging with normal direction of blood flow towards the liver. Other: None. IMPRESSION: 1. Significant sludge is seen filling the gallbladder, a new finding since November of 2021. No wall thickening, Murphy's sign, shadowing stones, or pericholecystic fluid. 2. Multiple non mobile echogenic nonshadowing foci are seen throughout the gallbladder. The largest measures 5 mm today versus 6 mm previously. The others were not seen in 2021. Differential considerations would include polyps versus tumefactive sludge. The rapid development of several of these findings in less than 2 months suggests tumefactive sludge. Recommend follow-up ultrasound in 6 months to ensure resolution.   -Robotic Assisted Laparoscopic Cholecystectomy - 03/09/20   HPI:  35 y.o. [id number] [redacted]w[redacted]d by 05/08/2020, by Last Menstrual Period presenting to L&D for evaluation with N/V that started four days ago. Patient states that she had a steak with macaroni and cheese prior to the onset of these sx.  Patient states that the sx are similar to what she has experienced previously when seen during this pregnancy when dx'd with gallbladder "sludge" on [place]. Patient states that she has not been able to eat food since Monday, has been tolerating PO intake of fluids. Patient reports intermittent RUQ pain that radiates to her shoulder blade which is associated with movement. Denies pain at rest.  Past Medical History:  Diagnosis Date  . Anemia    Patient reports anemia with her pregnancies  . Infection    UTI  . Miscarriage   . Vaginal Pap smear, abnormal     Past Surgical History:  Procedure Laterality Date  . NO PAST SURGERIES      No Known Allergies  Social History   Tobacco Use  . Smoking status: Former Games developer  . Smokeless tobacco: Never Used  . Tobacco comment: quit 2015  Vaping Use  . Vaping Use: Never used  Substance Use Topics  . Alcohol use: Not Currently    Comment: 2-3 per week when not pregnant  . Drug use: Not Currently    Types: Marijuana    Comment: smoking when zofran not working,  last 10/3    Family History  Problem Relation Age of Onset  . Healthy Mother   . Healthy Father     Hospital Course:  Patient was admitted for symptomatic treatment of biliary colic including IV hydration, electrolyte replacement, and antiemetics. [place] was obtained of RUQ with significant sludge seen filling the gallbladder, a new finding since prior US from 11/21. General surgery was consulted. A trail of supportive management was attempted with minimal resolution in symptoms of N/V and pain. Patient opted to proceed with laparoscopic cholecystectomy. Patient tolerated the procedure well and experienced an immediate resolution in her sx of N/V postoperatively.  Patient was treated for mild to  moderate post-operative pain. Fetal well-being was reassuring with reactive NST was noted pre and post procedure. On POD day #1 patient tolerated PO intake well without c/o N/V. Pain was controlled  with PO analgesia. Plan made to discharge patient home in stable condition.   Discharge Exam: BP 121/76 (BP Location: Right Arm)   Pulse 81   Temp 98.4 F (36.9 C) (Oral)   Resp 18   Ht 5' (1.524 m)   Wt 71.2 kg   LMP 08/02/2019   SpO2 99%   BMI 30.66 kg/m  Physical Exam Constitutional:      Appearance: Normal appearance.  Cardiovascular:     Rate and Rhythm: Normal rate and regular rhythm.     Pulses: Normal pulses.  Pulmonary:     Effort: Pulmonary effort is normal.  Abdominal:     General: Bowel sounds are normal. There is no distension.     Tenderness: There is abdominal tenderness.     Comments: Gravid, size consistent with 32 week dates  Musculoskeletal:        General: Normal range of motion.  Neurological:     General: No focal deficit present.     Mental Status: She is alert and oriented to person, place, and time.  Skin:    General: Skin is warm and dry.  Psychiatric:        Mood and Affect: Mood normal.        Behavior: Behavior normal.  Vitals and nursing note reviewed.      Condition at Discharge: Stable  Complications affecting treatment: None  Discharge Medications:  Allergies as of 03/10/2020   No Known Allergies     Medication List    STOP taking these medications   Doxylamine-Pyridoxine 10-10 MG Tbec Commonly known as: Diclegis   nitrofurantoin (macrocrystal-monohydrate) 100 MG capsule Commonly known as: MACROBID   promethazine 12.5 MG tablet Commonly known as: PHENERGAN   simethicone 80 MG chewable tablet Commonly known as: Gas-X     TAKE these medications   ferrous sulfate 325 (65 FE) MG tablet Commonly known as: FerrouSul Take 1 tablet (325 mg total) by mouth 2 (two) times daily.   HYDROcodone-acetaminophen 5-325 MG tablet Commonly known as: NORCO/VICODIN Take 1 tablet by mouth every 6 (six) hours as needed for up to 5 days for moderate pain.   multivitamin-prenatal 27-0.8 MG Tabs tablet Take 1 tablet by mouth daily at 12  noon.   ondansetron 4 MG tablet Commonly known as: ZOFRAN Take 1 tablet (4 mg total) by mouth every 6 (six) hours.        Follow-up arrangements:   Follow-up Information    Carolan Shiver, MD. Go on 03/25/2020.   Specialty: General Surgery Why: post-op follow up @ 0945 am Contact information: [address] [place] [address] [phone]               A total of 30 minutes were spent face-to-face with the patient as well as preparation, review, communication, and documentation during this encounter.    Discharge Disposition: Discharge disposition: 01-Home or Self Care    Signed: Zipporah Plants, CNM  03/10/2020 12:35 PM

## 2020-03-11 LAB — SURGICAL PATHOLOGY

## 2020-03-17 ENCOUNTER — Encounter: Payer: Medicaid Other | Admitting: Obstetrics

## 2020-03-18 ENCOUNTER — Ambulatory Visit (INDEPENDENT_AMBULATORY_CARE_PROVIDER_SITE_OTHER): Payer: Medicaid Other | Admitting: Obstetrics

## 2020-03-18 ENCOUNTER — Other Ambulatory Visit: Payer: Self-pay

## 2020-03-18 DIAGNOSIS — Z3480 Encounter for supervision of other normal pregnancy, unspecified trimester: Secondary | ICD-10-CM

## 2020-03-18 DIAGNOSIS — K829 Disease of gallbladder, unspecified: Secondary | ICD-10-CM

## 2020-03-18 DIAGNOSIS — O26613 Liver and biliary tract disorders in pregnancy, third trimester: Secondary | ICD-10-CM

## 2020-03-18 LAB — POCT URINALYSIS DIPSTICK OB
Glucose, UA: NEGATIVE
POC,PROTEIN,UA: NEGATIVE

## 2020-03-18 NOTE — Progress Notes (Signed)
ROB [redacted]w[redacted]d

## 2020-03-18 NOTE — Progress Notes (Signed)
Routine Prenatal Care Visit  Subjective  Breanna Webb is a 35 y.o. [phone] at [redacted]w[redacted]d being seen today for ongoing prenatal care.  She is currently monitored for the following issues for this high-risk pregnancy and has Supervision of other normal pregnancy, antepartum; Trichomonas vaginitis; Abnormal gallbladder ultrasound; Abdominal pain in pregnancy, third trimester; Anemia during pregnancy in third trimester; Pregnancy; [redacted] weeks gestation of pregnancy; Recurrent biliary colic; Gallbladder disease affecting pregnancy in third trimester; and Nausea/vomiting in pregnancy on their problem list.  ----------------------------------------------------------------------------------- Patient reports no bleeding, no contractions, no leaking and She is recovering from gallbladder surgery.  She had a lap chole on the 9th and reports feeling much less nausea nd no vomiting. Her baby is moving well and seh denies any contractions..   Contractions: Not present. Vag. Bleeding: None.  Movement: Present. Leaking Fluid denies.  ----------------------------------------------------------------------------------- The following portions of the patient's history were reviewed and updated as appropriate: allergies, current medications, past family history, past medical history, past social history, past surgical history and problem list. Problem list updated.  Objective  Blood pressure 112/70, height 5' (1.524 m), weight 156 lb 6.4 oz (70.9 kg), last menstrual period 08/02/2019. Pregravid weight 154 lb (69.9 kg) Total Weight Gain 2 lb 6.4 oz (1.089 kg) Urinalysis: Urine Protein Negative  Urine Glucose Negative  Fetal Status:     Movement: Present     General:  Alert, oriented and cooperative. Patient is in no acute distress.  Skin: Skin is warm and dry. No rash noted.   Cardiovascular: Normal heart rate noted  Respiratory: Normal respiratory effort, no problems with respiration noted  Abdomen: Soft, gravid,  appropriate for gestational age. Pain/Pressure: Absent     Pelvic:  Cervical exam deferred        Extremities: Normal range of motion.     Mental Status: Normal mood and affect. Normal behavior. Normal judgment and thought content.   Assessment   35 y.o. [id number] at [redacted]w[redacted]d by  05/08/2020, by Last Menstrual Period presenting for routine prenatal visit  Plan   pregnancy Problems (from 10/15/19 to present)    Problem Noted Resolved   Abdominal pain in pregnancy, third trimester 01/16/2020 by Mirna Mires, CNM No   Supervision of other normal pregnancy, antepartum 10/15/2019 by Mirna Mires, CNM No   Overview Addendum 02/25/2020  3:35 PM by Mirna Mires, CNM     Clinic [facility] Prenatal Labs  Dating IUP, [place] confirms Blood type: B/Positive/-- (09/08 1505)   Genetic Screen  NIPS: Inheritest neg Antibody:Negative (09/08 1505)  Anatomic [place] Normal female Rubella: 2.77 (09/08 1505)  GTT      Third trimester:  RPR: Non Reactive (09/08 1505)   Flu vaccine  HBsAg: Negative (09/08 1505)   TDaP vaccine                                               Rhogam: NA HIV: Non Reactive (09/08 1505)   Baby Food                undecided                               GBS: (For PCN allergy, check sensitivities)  Contraception  Pap:    NILM with high Risk HPV  Circumcision    Pediatrician  Support Person              Previous Version       Preterm labor symptoms and general obstetric precautions including but not limited to vaginal bleeding, contractions, leaking of fluid and fetal movement were reviewed in detail with the patient. Please refer to After Visit Summary for other counseling recommendations.  She has follow up  With the surgeon in a few weeks.  She needs a TOC for her previous trich infection- ( she left before this was "caught".)  Return in about 2 weeks (around 04/01/2020) for return OB.  Mirna Mires, CNM  03/18/2020 5:01 PM

## 2020-04-01 ENCOUNTER — Encounter: Payer: Self-pay | Admitting: Advanced Practice Midwife

## 2020-04-01 ENCOUNTER — Other Ambulatory Visit: Payer: Self-pay

## 2020-04-01 ENCOUNTER — Ambulatory Visit (INDEPENDENT_AMBULATORY_CARE_PROVIDER_SITE_OTHER): Payer: Medicaid Other | Admitting: Advanced Practice Midwife

## 2020-04-01 VITALS — BP 100/70 | Wt 156.0 lb

## 2020-04-01 DIAGNOSIS — Z3A34 34 weeks gestation of pregnancy: Secondary | ICD-10-CM

## 2020-04-01 DIAGNOSIS — Z3483 Encounter for supervision of other normal pregnancy, third trimester: Secondary | ICD-10-CM

## 2020-04-01 LAB — POCT URINALYSIS DIPSTICK OB
Glucose, UA: NEGATIVE
POC,PROTEIN,UA: NEGATIVE

## 2020-04-01 NOTE — Progress Notes (Signed)
Routine Prenatal Care Visit  Subjective  Breanna Webb is a 35 y.o. [phone] at [redacted]w[redacted]d being seen today for ongoing prenatal care.  She is currently monitored for the following issues for this low-risk pregnancy and has Supervision of other normal pregnancy, antepartum; Trichomonas vaginitis; Abnormal gallbladder ultrasound; Abdominal pain in pregnancy, third trimester; Anemia during pregnancy in third trimester; Pregnancy; [redacted] weeks gestation of pregnancy; Recurrent biliary colic; Gallbladder disease affecting pregnancy in third trimester; and Nausea/vomiting in pregnancy on their problem list.  ----------------------------------------------------------------------------------- Patient reports no complaints.  She is doing well since her cholecystectomy 3 weeks ago. Contractions: Not present. Vag. Bleeding: None.  Movement: Present. Leaking Fluid denies.  ----------------------------------------------------------------------------------- The following portions of the patient's history were reviewed and updated as appropriate: allergies, current medications, past family history, past medical history, past social history, past surgical history and problem list. Problem list updated.  Objective  Blood pressure 100/70, weight 156 lb (70.8 kg), last menstrual period 08/02/2019. Pregravid weight 154 lb (69.9 kg) Total Weight Gain 2 lb (0.907 kg) Urinalysis: Urine Protein    Urine Glucose    Fetal Status: Fetal Heart Rate (bpm): 133 Fundal Height: 34 cm Movement: Present     General:  Alert, oriented and cooperative. Patient is in no acute distress.  Skin: Skin is warm and dry. No rash noted.   Cardiovascular: Normal heart rate noted  Respiratory: Normal respiratory effort, no problems with respiration noted  Abdomen: Soft, gravid, appropriate for gestational age. Pain/Pressure: Absent     Pelvic:  Cervical exam deferred        Extremities: Normal range of motion.  Edema: None  Mental Status:  Normal mood and affect. Normal behavior. Normal judgment and thought content.   Assessment   35 y.o. [id number] at [redacted]w[redacted]d by  05/08/2020, by Last Menstrual Period presenting for routine prenatal visit  Plan   pregnancy Problems (from 10/15/19 to present)    Problem Noted Resolved   Abdominal pain in pregnancy, third trimester 01/16/2020 by Mirna Mires, CNM No   Supervision of other normal pregnancy, antepartum 10/15/2019 by Mirna Mires, CNM No   Overview Addendum 02/25/2020  3:35 PM by Mirna Mires, CNM     Clinic [facility] Prenatal Labs  Dating IUP, [place] confirms Blood type: B/Positive/-- (09/08 1505)   Genetic Screen  NIPS: Inheritest neg Antibody:Negative (09/08 1505)  Anatomic [place] Normal female Rubella: 2.77 (09/08 1505)  GTT      Third trimester:  RPR: Non Reactive (09/08 1505)   Flu vaccine  HBsAg: Negative (09/08 1505)   TDaP vaccine                                               Rhogam: NA HIV: Non Reactive (09/08 1505)   Baby Food                undecided                               GBS: (For PCN allergy, check sensitivities)  Contraception  Pap:    NILM with high Risk HPV  Circumcision    Pediatrician    Support Person              Previous Version       Preterm labor symptoms and general obstetric  precautions including but not limited to vaginal bleeding, contractions, leaking of fluid and fetal movement were reviewed in detail with the patient.   Return in about 2 weeks (around 04/15/2020) for rob.  Tresea Mall, CNM 04/01/2020 2:31 PM

## 2020-04-01 NOTE — Addendum Note (Signed)
Addended by: Cornelius Moras D on: 04/01/2020 02:34 PM   Modules accepted: Orders

## 2020-04-15 ENCOUNTER — Ambulatory Visit (INDEPENDENT_AMBULATORY_CARE_PROVIDER_SITE_OTHER): Payer: Medicaid Other | Admitting: Advanced Practice Midwife

## 2020-04-15 ENCOUNTER — Encounter: Payer: Self-pay | Admitting: Advanced Practice Midwife

## 2020-04-15 ENCOUNTER — Other Ambulatory Visit (HOSPITAL_COMMUNITY)
Admission: RE | Admit: 2020-04-15 | Discharge: 2020-04-15 | Disposition: A | Discharge status: Home / Self Care | Payer: Medicaid Other | Source: Ambulatory Visit | Attending: Advanced Practice Midwife | Admitting: Advanced Practice Midwife

## 2020-04-15 ENCOUNTER — Other Ambulatory Visit: Payer: Self-pay

## 2020-04-15 VITALS — BP 100/70 | Wt 159.0 lb

## 2020-04-15 DIAGNOSIS — Z113 Encounter for screening for infections with a predominantly sexual mode of transmission: Secondary | ICD-10-CM | POA: Insufficient documentation

## 2020-04-15 DIAGNOSIS — Z3483 Encounter for supervision of other normal pregnancy, third trimester: Secondary | ICD-10-CM

## 2020-04-15 DIAGNOSIS — Z3685 Encounter for antenatal screening for Streptococcus B: Secondary | ICD-10-CM | POA: Diagnosis not present

## 2020-04-15 DIAGNOSIS — Z3A36 36 weeks gestation of pregnancy: Secondary | ICD-10-CM

## 2020-04-15 DIAGNOSIS — Z369 Encounter for antenatal screening, unspecified: Secondary | ICD-10-CM

## 2020-04-15 LAB — OB RESULTS CONSOLE GC/CHLAMYDIA: Gonorrhea: NEGATIVE

## 2020-04-15 NOTE — Progress Notes (Signed)
Routine Prenatal Care Visit  Subjective  Breanna Webb is a 35 y.o. [phone] at [redacted]w[redacted]d being seen today for ongoing prenatal care.  She is currently monitored for the following issues for this low-risk pregnancy and has Supervision of other normal pregnancy, antepartum; Trichomonas vaginitis; Abnormal gallbladder ultrasound; Abdominal pain in pregnancy, third trimester; Anemia during pregnancy in third trimester; Pregnancy; [redacted] weeks gestation of pregnancy; Recurrent biliary colic; Gallbladder disease affecting pregnancy in third trimester; and Nausea/vomiting in pregnancy on their problem list.  ----------------------------------------------------------------------------------- Patient reports no complaints.   Contractions: Not present. Vag. Bleeding: None.  Movement: Present. Leaking Fluid denies.  ----------------------------------------------------------------------------------- The following portions of the patient's history were reviewed and updated as appropriate: allergies, current medications, past family history, past medical history, past social history, past surgical history and problem list. Problem list updated.  Objective  Blood pressure 100/70, weight 159 lb (72.1 kg), last menstrual period 08/02/2019. Pregravid weight 154 lb (69.9 kg) Total Weight Gain 5 lb (2.268 kg) Urinalysis: Urine Protein    Urine Glucose    Fetal Status: Fetal Heart Rate (bpm): 154 Fundal Height: 36 cm Movement: Present     General:  Alert, oriented and cooperative. Patient is in no acute distress.  Skin: Skin is warm and dry. No rash noted.   Cardiovascular: Normal heart rate noted  Respiratory: Normal respiratory effort, no problems with respiration noted  Abdomen: Soft, gravid, appropriate for gestational age. Pain/Pressure: Absent     Pelvic:  Cervical exam deferred      GBS/aptima collected   Extremities: Normal range of motion.  Edema: None  Mental Status: Normal mood and affect. Normal  behavior. Normal judgment and thought content.   Assessment   35 y.o. [id number] at [redacted]w[redacted]d by  05/08/2020, by Last Menstrual Period presenting for routine prenatal visit  Plan   pregnancy Problems (from 10/15/19 to present)    Problem Noted Resolved   Abdominal pain in pregnancy, third trimester 01/16/2020 by Mirna Mires, CNM No   Supervision of other normal pregnancy, antepartum 10/15/2019 by Mirna Mires, CNM No   Overview Addendum 02/25/2020  3:35 PM by Mirna Mires, CNM     Clinic [facility] Prenatal Labs  Dating IUP, [place] confirms Blood type: B/Positive/-- (09/08 1505)   Genetic Screen  NIPS: Inheritest neg Antibody:Negative (09/08 1505)  Anatomic [place] Normal female Rubella: 2.77 (09/08 1505)  GTT      Third trimester:  RPR: Non Reactive (09/08 1505)   Flu vaccine  HBsAg: Negative (09/08 1505)   TDaP vaccine                                               Rhogam: NA HIV: Non Reactive (09/08 1505)   Breanna Food                undecided                               GBS: (For PCN allergy, check sensitivities)  Contraception  Pap:    NILM with high Risk HPV  Circumcision    Pediatrician    Support Person              Previous Version       Preterm labor symptoms and general obstetric precautions including but not limited to vaginal  bleeding, contractions, leaking of fluid and fetal movement were reviewed in detail with the patient.   Return in about 1 week (around 04/22/2020) for rob.  Tresea Mall, CNM 04/15/2020 2:58 PM

## 2020-04-17 LAB — CERVICOVAGINAL ANCILLARY ONLY
Chlamydia: NEGATIVE
Comment: NEGATIVE
Comment: NEGATIVE
Comment: NORMAL
Neisseria Gonorrhea: NEGATIVE
Trichomonas: NEGATIVE

## 2020-04-17 LAB — STREP GP B NAA: Strep Gp B NAA: NEGATIVE

## 2020-04-22 ENCOUNTER — Ambulatory Visit (INDEPENDENT_AMBULATORY_CARE_PROVIDER_SITE_OTHER): Payer: Medicaid Other | Admitting: Obstetrics

## 2020-04-22 ENCOUNTER — Other Ambulatory Visit: Payer: Self-pay

## 2020-04-22 VITALS — BP 118/74 | Wt 162.0 lb

## 2020-04-22 DIAGNOSIS — Z3483 Encounter for supervision of other normal pregnancy, third trimester: Secondary | ICD-10-CM

## 2020-04-22 DIAGNOSIS — Z3A37 37 weeks gestation of pregnancy: Secondary | ICD-10-CM

## 2020-04-22 NOTE — Progress Notes (Signed)
No vb. No lof.  

## 2020-04-22 NOTE — Progress Notes (Signed)
Routine Prenatal Care Visit  Subjective  Breanna Webb is a 35 y.o. [phone] at [redacted]w[redacted]d being seen today for ongoing prenatal care.  She is currently monitored for the following issues for this high-risk pregnancy and has Supervision of other normal pregnancy, antepartum; Trichomonas vaginitis; Abnormal gallbladder ultrasound; Abdominal pain in pregnancy, third trimester; Anemia during pregnancy in third trimester; Pregnancy; [redacted] weeks gestation of pregnancy; Recurrent biliary colic; Gallbladder disease affecting pregnancy in third trimester; and Nausea/vomiting in pregnancy on their problem list.  ----------------------------------------------------------------------------------- Patient reports no bleeding, no leaking and occasional contractions.   Contractions: Not present. Vag. Bleeding: None.  Movement: Present. Leaking Fluid denies.  ----------------------------------------------------------------------------------- The following portions of the patient's history were reviewed and updated as appropriate: allergies, current medications, past family history, past medical history, past social history, past surgical history and problem list. Problem list updated.  Objective  Blood pressure 118/74, weight 162 lb (73.5 kg), last menstrual period 08/02/2019. Pregravid weight 154 lb (69.9 kg) Total Weight Gain 8 lb (3.629 kg) Urinalysis: Urine Protein    Urine Glucose    Fetal Status:     Movement: Present     General:  Alert, oriented and cooperative. Patient is in no acute distress.  Skin: Skin is warm and dry. No rash noted.   Cardiovascular: Normal heart rate noted  Respiratory: Normal respiratory effort, no problems with respiration noted  Abdomen: Soft, gravid, appropriate for gestational age. Pain/Pressure: Absent     Pelvic:  Cervical exam deferred        Extremities: Normal range of motion.     Mental Status: Normal mood and affect. Normal behavior. Normal judgment and thought  content.   Assessment   35 y.o. [id number] at [redacted]w[redacted]d by  05/08/2020, by Last Menstrual Period presenting for routine prenatal visit  Plan   pregnancy Problems (from 10/15/19 to present)    Problem Noted Resolved   Abdominal pain in pregnancy, third trimester 01/16/2020 by Mirna Mires, CNM No   Supervision of other normal pregnancy, antepartum 10/15/2019 by Mirna Mires, CNM No   Overview Addendum 02/25/2020  3:35 PM by Mirna Mires, CNM     Clinic [facility] Prenatal Labs  Dating IUP, [place] confirms Blood type: B/Positive/-- (09/08 1505)   Genetic Screen  NIPS: Inheritest neg Antibody:Negative (09/08 1505)  Anatomic [place] Normal female Rubella: 2.77 (09/08 1505)  GTT      Third trimester:  RPR: Non Reactive (09/08 1505)   Flu vaccine  HBsAg: Negative (09/08 1505)   TDaP vaccine                                               Rhogam: NA HIV: Non Reactive (09/08 1505)   Baby Food                undecided                               GBS: (For PCN allergy, check sensitivities)  Contraception  Pap:    NILM with high Risk HPV  Circumcision    Pediatrician    Support Person              Previous Version       Term labor symptoms and general obstetric precautions including but not limited to vaginal bleeding, contractions,  leaking of fluid and fetal movement were reviewed in detail with the patient. Please refer to After Visit Summary for other counseling recommendations.   Return in about 1 week (around 04/29/2020) for return OB.  Mirna Mires, CNM  04/22/2020 3:23 PM

## 2020-04-28 ENCOUNTER — Ambulatory Visit (INDEPENDENT_AMBULATORY_CARE_PROVIDER_SITE_OTHER): Payer: Medicaid Other | Admitting: Obstetrics and Gynecology

## 2020-04-28 ENCOUNTER — Other Ambulatory Visit: Payer: Self-pay

## 2020-04-28 ENCOUNTER — Encounter: Payer: Self-pay | Admitting: Obstetrics and Gynecology

## 2020-04-28 VITALS — BP 110/68 | Wt 166.0 lb

## 2020-04-28 DIAGNOSIS — Z23 Encounter for immunization: Secondary | ICD-10-CM | POA: Diagnosis not present

## 2020-04-28 DIAGNOSIS — Z3480 Encounter for supervision of other normal pregnancy, unspecified trimester: Secondary | ICD-10-CM

## 2020-04-28 DIAGNOSIS — Z3A38 38 weeks gestation of pregnancy: Secondary | ICD-10-CM

## 2020-04-28 DIAGNOSIS — Z3483 Encounter for supervision of other normal pregnancy, third trimester: Secondary | ICD-10-CM

## 2020-04-28 NOTE — Addendum Note (Signed)
Addended by: Fortunato Curling R on: 04/28/2020 03:06 PM   Modules accepted: Orders

## 2020-04-28 NOTE — Progress Notes (Signed)
Routine Prenatal Care Visit  Subjective  Breanna Webb is a 35 y.o. [phone] at [redacted]w[redacted]d being seen today for ongoing prenatal care.  She is currently monitored for the following issues for this low-risk pregnancy and has Supervision of other normal pregnancy, antepartum; Trichomonas vaginitis; Abnormal gallbladder ultrasound; Abdominal pain in pregnancy, third trimester; Anemia during pregnancy in third trimester; Pregnancy; [redacted] weeks gestation of pregnancy; Recurrent biliary colic; Gallbladder disease affecting pregnancy in third trimester; and Nausea/vomiting in pregnancy on their problem list.  ----------------------------------------------------------------------------------- Patient reports no complaints. Patient reports irregular BH ctx. Contractions: Irregular. Vag. Bleeding: None.  Movement: Present. Denies leaking of fluid.  ----------------------------------------------------------------------------------- The following portions of the patient's history were reviewed and updated as appropriate: allergies, current medications, past family history, past medical history, past social history, past surgical history and problem list. Problem list updated.   Objective  Blood pressure 110/68, weight 166 lb (75.3 kg), last menstrual period 08/02/2019. Pregravid weight 154 lb (69.9 kg) Total Weight Gain 12 lb (5.443 kg) Urinalysis:      Fetal Status: Fetal Heart Rate (bpm): 150 Fundal Height: 38 cm Movement: Present  Presentation: Vertex  General:  Alert, oriented and cooperative. Patient is in no acute distress.  Skin: Skin is warm and dry. No rash noted.   Cardiovascular: Normal heart rate noted  Respiratory: Normal respiratory effort, no problems with respiration noted  Abdomen: Soft, gravid, appropriate for gestational age. Pain/Pressure: Absent     Pelvic:  Cervical exam deferred        Extremities: Normal range of motion.  Edema: None  ental Status: Normal mood and affect. Normal  behavior. Normal judgment and thought content.     Assessment   35 y.o. [id number] at [redacted]w[redacted]d by  05/08/2020, by Last Menstrual Period presenting for routine prenatal visit  Plan   pregnancy Problems (from 10/15/19 to present)    Problem Noted Resolved   Abdominal pain in pregnancy, third trimester 01/16/2020 by Mirna Mires, CNM No   Supervision of other normal pregnancy, antepartum 10/15/2019 by Mirna Mires, CNM No   Overview Addendum 04/28/2020  2:53 PM by Zipporah Plants, CNM     Clinic [facility] Prenatal Labs  Dating  LMP = 5w [place] Blood type: B/Positive/-- (09/08 1505)   Genetic Screen  NIPS: negative x3, XX  Inheritest: negative x3 Antibody:Negative (09/08 1505)  Anatomic [place] Normal female Rubella: 2.77 (09/08 1505)  GTT      Third trimester: 114  RPR: Non Reactive (09/08 1505)   Flu vaccine  declines HBsAg: Negative (09/08 1505)   TDaP vaccine   04/28/20                                   Rhogam: NA HIV: Non Reactive (09/08 1505)   Baby Food  would like to try to pump                            GBS: (For PCN allergy, check sensitivities)  Contraception  undecided Pap:    NILM with high Risk HPV  Circumcision  n/a   Pediatrician    Support Person  partner (truck driver, if out of town at delivery then sister will be present)             Previous Version      -Tdap today -Reviewed postpartum contraceptive options -Discussed spontaneous onset of  labor vs eIOL. Patient desires to wait till EDD for onset of labor at this time.  Term precautions including but not limited to vaginal bleeding, contractions, leaking of fluid and fetal movement were reviewed in detail with the patient.    Return in about 1 week (around 05/05/2020) for ROB.  Zipporah Plants, CNM, MSN [facility], [facility] 04/28/2020, 2:54 PM

## 2020-05-05 ENCOUNTER — Encounter: Payer: Medicaid Other | Admitting: Obstetrics

## 2020-05-06 ENCOUNTER — Ambulatory Visit (INDEPENDENT_AMBULATORY_CARE_PROVIDER_SITE_OTHER): Payer: Medicaid Other | Admitting: Obstetrics

## 2020-05-06 ENCOUNTER — Other Ambulatory Visit: Payer: Self-pay

## 2020-05-06 VITALS — BP 116/72 | Wt 169.0 lb

## 2020-05-06 DIAGNOSIS — Z3483 Encounter for supervision of other normal pregnancy, third trimester: Secondary | ICD-10-CM

## 2020-05-06 DIAGNOSIS — Z3A39 39 weeks gestation of pregnancy: Secondary | ICD-10-CM

## 2020-05-06 NOTE — Progress Notes (Signed)
No vb. No lof. Cervical check today  °

## 2020-05-06 NOTE — Progress Notes (Signed)
Routine Prenatal Care Visit  Subjective  Breanna Webb is a 35 y.o. [phone] at [redacted]w[redacted]d being seen today for ongoing prenatal care.  She is currently monitored for the following issues for this high-risk pregnancy and has Supervision of other normal pregnancy, antepartum; Trichomonas vaginitis; Abnormal gallbladder ultrasound; Abdominal pain in pregnancy, third trimester; Anemia during pregnancy in third trimester; Pregnancy; [redacted] weeks gestation of pregnancy; Recurrent biliary colic; Gallbladder disease affecting pregnancy in third trimester; and Nausea/vomiting in pregnancy on their problem list.  ----------------------------------------------------------------------------------- Patient reports no complaints.  No regular contractions, no LOF. She asks for anSVE and a "sweep" today.  . Vag. Bleeding: None.  Movement: Present. Leaking Fluid denies.  ----------------------------------------------------------------------------------- The following portions of the patient's history were reviewed and updated as appropriate: allergies, current medications, past family history, past medical history, past social history, past surgical history and problem list. Problem list updated.  Objective  Blood pressure 116/72, weight 169 lb (76.7 kg), last menstrual period 08/02/2019. Pregravid weight 154 lb (69.9 kg) Total Weight Gain 15 lb (6.804 kg) Urinalysis: Urine Protein    Urine Glucose    Fetal Status:     Movement: Present     General:  Alert, oriented and cooperative. Patient is in no acute distress.  Skin: Skin is warm and dry. No rash noted.   Cardiovascular: Normal heart rate noted  Respiratory: Normal respiratory effort, no problems with respiration noted  Abdomen: Soft, gravid, appropriate for gestational age. Pain/Pressure: Absent     Pelvic:  Cervical exam performed      cervix is anterior, tight 2cms  Extremities: Normal range of motion.     Mental Status: Normal mood and affect. Normal  behavior. Normal judgment and thought content.   Assessment   35 y.o. [id number] at [redacted]w[redacted]d by  05/08/2020, by Last Menstrual Period presenting for routine prenatal visit  Plan   pregnancy Problems (from 10/15/19 to present)    Problem Noted Resolved   Abdominal pain in pregnancy, third trimester 01/16/2020 by Mirna Mires, CNM No   Supervision of other normal pregnancy, antepartum 10/15/2019 by Mirna Mires, CNM No   Overview Addendum 04/28/2020  2:53 PM by Zipporah Plants, CNM     Clinic [facility] Prenatal Labs  Dating  LMP = 5w [place] Blood type: B/Positive/-- (09/08 1505)   Genetic Screen  NIPS: negative x3, XX  Inheritest: negative x3 Antibody:Negative (09/08 1505)  Anatomic [place] Normal female Rubella: 2.77 (09/08 1505)  GTT      Third trimester: 114  RPR: Non Reactive (09/08 1505)   Flu vaccine  declines HBsAg: Negative (09/08 1505)   TDaP vaccine   04/28/20                                   Rhogam: NA HIV: Non Reactive (09/08 1505)   Baby Food  would like to try to pump                            GBS: (For PCN allergy, check sensitivities)  Contraception  undecided Pap:    NILM with high Risk HPV  Circumcision  n/a   Pediatrician    Support Person  partner (truck driver, if out of town at delivery then sister will be present)             Previous Version       Term labor  symptoms and general obstetric precautions including but not limited to vaginal bleeding, contractions, leaking of fluid and fetal movement were reviewed in detail with the patient. Please refer to After Visit Summary for other counseling recommendations.   Return in about 1 week (around 05/13/2020) for return OB, discuss IOL.  Mirna Mires, CNM  05/06/2020 3:32 PM

## 2020-05-13 ENCOUNTER — Encounter: Payer: Medicaid Other | Admitting: Advanced Practice Midwife

## 2020-05-13 ENCOUNTER — Other Ambulatory Visit: Payer: Self-pay

## 2020-05-13 ENCOUNTER — Encounter: Payer: Self-pay | Admitting: Obstetrics & Gynecology

## 2020-05-13 ENCOUNTER — Ambulatory Visit (INDEPENDENT_AMBULATORY_CARE_PROVIDER_SITE_OTHER): Payer: Medicaid Other | Admitting: Obstetrics

## 2020-05-13 ENCOUNTER — Inpatient Hospital Stay
Admission: EM | Admit: 2020-05-13 | Discharge: 2020-05-15 | DRG: 807 | Disposition: A | Discharge status: Home / Self Care | Payer: Medicaid Other | Attending: Obstetrics & Gynecology | Admitting: Obstetrics & Gynecology

## 2020-05-13 VITALS — BP 110/60 | Wt 169.0 lb

## 2020-05-13 DIAGNOSIS — O480 Post-term pregnancy: Secondary | ICD-10-CM | POA: Diagnosis not present

## 2020-05-13 DIAGNOSIS — Z3480 Encounter for supervision of other normal pregnancy, unspecified trimester: Secondary | ICD-10-CM

## 2020-05-13 DIAGNOSIS — Z20822 Contact with and (suspected) exposure to covid-19: Secondary | ICD-10-CM | POA: Diagnosis present

## 2020-05-13 DIAGNOSIS — O9902 Anemia complicating childbirth: Secondary | ICD-10-CM | POA: Diagnosis not present

## 2020-05-13 DIAGNOSIS — D649 Anemia, unspecified: Secondary | ICD-10-CM | POA: Diagnosis present

## 2020-05-13 DIAGNOSIS — Z87891 Personal history of nicotine dependence: Secondary | ICD-10-CM

## 2020-05-13 DIAGNOSIS — O26893 Other specified pregnancy related conditions, third trimester: Principal | ICD-10-CM

## 2020-05-13 DIAGNOSIS — Z3A40 40 weeks gestation of pregnancy: Secondary | ICD-10-CM

## 2020-05-13 DIAGNOSIS — R109 Unspecified abdominal pain: Secondary | ICD-10-CM

## 2020-05-13 LAB — POCT URINALYSIS DIPSTICK OB
Glucose, UA: NEGATIVE
POC,PROTEIN,UA: NEGATIVE

## 2020-05-13 MED ORDER — OXYCODONE-ACETAMINOPHEN 5-325 MG PO TABS: 1.0000 | ORAL_TABLET | ORAL | Status: DC | PRN

## 2020-05-13 MED ORDER — ONDANSETRON HCL 4 MG/2ML IJ SOLN: 4.0000 mg | Freq: Four times a day (QID) | INTRAMUSCULAR | Status: DC | PRN

## 2020-05-13 MED ORDER — LACTATED RINGERS IV SOLN: INTRAVENOUS | Status: DC

## 2020-05-13 MED ORDER — ACETAMINOPHEN 325 MG PO TABS: 650.0000 mg | ORAL_TABLET | ORAL | Status: DC | PRN

## 2020-05-13 MED ORDER — BUTORPHANOL TARTRATE 1 MG/ML IJ SOLN: 1.0000 mg | INTRAMUSCULAR | Status: DC | PRN

## 2020-05-13 MED ORDER — SOD CITRATE-CITRIC ACID 500-334 MG/5ML PO SOLN: 30.0000 mL | ORAL | Status: DC | PRN

## 2020-05-13 MED ORDER — OXYTOCIN-SODIUM CHLORIDE 30-0.9 UT/500ML-% IV SOLN
2.5000 [IU]/h | INTRAVENOUS | Status: DC
  Filled 2020-05-13: qty 500

## 2020-05-13 MED ORDER — LACTATED RINGERS IV SOLN
500.0000 mL | INTRAVENOUS | Status: DC | PRN
Start: 2020-05-13 — End: 2020-05-14

## 2020-05-13 MED ORDER — LACTATED RINGERS IV SOLN: 500.0000 mL | INTRAVENOUS | Status: DC | PRN

## 2020-05-13 MED ORDER — OXYTOCIN-SODIUM CHLORIDE 30-0.9 UT/500ML-% IV SOLN: 2.5000 [IU]/h | INTRAVENOUS | Status: DC

## 2020-05-13 MED ORDER — OXYTOCIN BOLUS FROM INFUSION
333.0000 mL | Freq: Once | INTRAVENOUS | Status: AC
  Administered 2020-05-14: 333 mL via INTRAVENOUS

## 2020-05-13 MED ORDER — LIDOCAINE HCL (PF) 1 % IJ SOLN: 30.0000 mL | INTRAMUSCULAR | Status: DC | PRN

## 2020-05-13 MED ORDER — TERBUTALINE SULFATE 1 MG/ML IJ SOLN: 0.2500 mg | Freq: Once | INTRAMUSCULAR | Status: DC | PRN

## 2020-05-13 MED ORDER — LIDOCAINE HCL (PF) 1 % IJ SOLN
30.0000 mL | INTRAMUSCULAR | Status: DC | PRN
  Filled 2020-05-13: qty 30

## 2020-05-13 MED ORDER — OXYTOCIN-SODIUM CHLORIDE 30-0.9 UT/500ML-% IV SOLN
1.0000 m[IU]/min | INTRAVENOUS | Status: DC
  Administered 2020-05-14: 2 m[IU]/min via INTRAVENOUS

## 2020-05-13 MED ORDER — OXYTOCIN BOLUS FROM INFUSION: 333.0000 mL | Freq: Once | INTRAVENOUS | Status: DC

## 2020-05-13 MED ORDER — OXYCODONE-ACETAMINOPHEN 5-325 MG PO TABS: 2.0000 | ORAL_TABLET | ORAL | Status: DC | PRN

## 2020-05-13 NOTE — OB Triage Note (Signed)
Pt Breanna Webb 35 y.o. presents to the ED complaining of contractions every 2-4 minutes. Pt is a [id number] at [redacted]w[redacted]d . Pt denies signs and symptoms consistent with rupture of membranes or active vaginal bleeding. Pt states positive fetal movement. External FM and TOCO applied to non-tender abdomen and assessing. Initial FHR 155. Vital signs obtained and within normal limits. Provider notified of pt.

## 2020-05-13 NOTE — Progress Notes (Signed)
Routine Prenatal Care Visit  Subjective  Breanna Webb is a 35 y.o. [phone] at [redacted]w[redacted]d being seen today for ongoing prenatal care.  She is currently monitored for the following issues for this high-risk pregnancy and has Supervision of other normal pregnancy, antepartum; Trichomonas vaginitis; Abnormal gallbladder ultrasound; Abdominal pain in pregnancy, third trimester; Anemia during pregnancy in third trimester; Pregnancy; [redacted] weeks gestation of pregnancy; Recurrent biliary colic; Gallbladder disease affecting pregnancy in third trimester; and Nausea/vomiting in pregnancy on their problem list.  ----------------------------------------------------------------------------------- Patient reports no complaints.  She passed her mucous plug. Occasional contractions. Interested in IOL at 41 weeks. Contractions: Irregular. Vag. Bleeding: None.  Movement: Present. Leaking Fluid denies.  ----------------------------------------------------------------------------------- The following portions of the patient's history were reviewed and updated as appropriate: allergies, current medications, past family history, past medical history, past social history, past surgical history and problem list. Problem list updated.  Objective  Blood pressure 110/60, weight 169 lb (76.7 kg), last menstrual period 08/02/2019. Pregravid weight 154 lb (69.9 kg) Total Weight Gain 15 lb (6.804 kg) Urinalysis: Urine Protein    Urine Glucose    Fetal Status:     Movement: Present     General:  Alert, oriented and cooperative. Patient is in no acute distress.  Skin: Skin is warm and dry. No rash noted.   Cardiovascular: Normal heart rate noted  Respiratory: Normal respiratory effort, no problems with respiration noted  Abdomen: Soft, gravid, appropriate for gestational age. Pain/Pressure: Present     Pelvic:  Cervical exam performed      cervix is midway: 3-3.5cms/60% effaced/ballotable and vertex. soft  Extremities: Normal  range of motion.     Mental Status: Normal mood and affect. Normal behavior. Normal judgment and thought content.   Assessment   36 y.o. [id number] at [redacted]w[redacted]d by  05/08/2020, by Last Menstrual Period presenting for routine prenatal visit  Plan   pregnancy Problems (from 10/15/19 to present)    Problem Noted Resolved   Abdominal pain in pregnancy, third trimester 01/16/2020 by Mirna Mires, CNM No   Supervision of other normal pregnancy, antepartum 10/15/2019 by Mirna Mires, CNM No   Overview Addendum 04/28/2020  2:53 PM by Zipporah Plants, CNM     Clinic [facility] Prenatal Labs  Dating  LMP = 5w [place] Blood type: B/Positive/-- (09/08 1505)   Genetic Screen  NIPS: negative x3, XX  Inheritest: negative x3 Antibody:Negative (09/08 1505)  Anatomic [place] Normal female Rubella: 2.77 (09/08 1505)  GTT      Third trimester: 114  RPR: Non Reactive (09/08 1505)   Flu vaccine  declines HBsAg: Negative (09/08 1505)   TDaP vaccine   04/28/20                                   Rhogam: NA HIV: Non Reactive (09/08 1505)   Baby Food  would like to try to pump                            GBS: (For PCN allergy, check sensitivities)  Contraception  undecided Pap:    NILM with high Risk HPV  Circumcision  n/a   Pediatrician    Support Person  partner (truck driver, if out of town at delivery then sister will be present)             Previous Version  Preterm labor symptoms and general obstetric precautions including but not limited to vaginal bleeding, contractions, leaking of fluid and fetal movement were reviewed in detail with the patient. Please refer to After Visit Summary for other counseling recommendations.  Cervix is swept with her permission today.  IOL set up for two days hence- midnight Wednesday night. Orders placed. Will wait on IOL date to complete H an P in the event she labors spontaneously. Return if symptoms worsen or fail to improve.  Mirna Mires, CNM  05/13/2020 4:17 PM

## 2020-05-13 NOTE — H&P (Signed)
Obstetrics Admission History & Physical   Labor Eval   HPI:  35 y.o. [id number] @ [redacted]w[redacted]d (05/08/2020, by Last Menstrual Period). Admitted on 05/13/2020:   Patient Active Problem List   Diagnosis Date Noted  . [redacted] weeks gestation of pregnancy 05/13/2020  . Gallbladder disease affecting pregnancy in third trimester 03/08/2020  . Recurrent biliary colic   . Pregnancy 03/07/2020  . [redacted] weeks gestation of pregnancy 03/07/2020  . Nausea/vomiting in pregnancy 03/07/2020  . Anemia during pregnancy in third trimester 02/26/2020  . Abdominal pain in pregnancy, third trimester 01/16/2020  . Abnormal gallbladder ultrasound 12/13/2019  . Trichomonas vaginitis 10/29/2019  . Supervision of other normal pregnancy, antepartum 10/15/2019     Presents for painful ctxs, now past her due date. No VB or ROM.Marland Kitchen   Prenatal care at: at [facility]. Pregnancy complicated by surgery for gall bladder 2 mos ago.  ROS: A review of systems was performed and negative, except as stated in the above HPI. PMHx:  Past Medical History:  Diagnosis Date  . Anemia    Patient reports anemia with her pregnancies  . Infection    UTI  . Miscarriage   . Vaginal Pap smear, abnormal    PSHx:  Past Surgical History:  Procedure Laterality Date  . galbladder removed     Medications:  Medications Prior to Admission  Medication Sig Dispense Refill Last Dose  . ferrous sulfate (FERROUSUL) 325 (65 FE) MG tablet Take 1 tablet (325 mg total) by mouth 2 (two) times daily. 60 tablet 1 Past Month at Unknown time  . Prenatal Vit-Fe Fumarate-FA (MULTIVITAMIN-PRENATAL) 27-0.8 MG TABS tablet Take 1 tablet by mouth daily at 12 noon.    Past Month at Unknown time  . ondansetron (ZOFRAN) 4 MG tablet Take 1 tablet (4 mg total) by mouth every 6 (six) hours. 20 tablet 0    Allergies: has No Known Allergies. OBHx:  OB History  Gravida Para Term Preterm AB Living  5 3 3   1 3   SAB IAB Ectopic Multiple Live Births  1       3    # Outcome Date  GA Lbr Len/2nd Weight Sex Delivery Anes PTL Lv  5 Current           4 Term      Vag-Spont  N LIV  3 Term      Vag-Spont  N LIV  2 Term      Vag-Spont  N LIV  1 SAB            except as detailed in HPI.RFF:[id number]  No family history of birth defects. Soc Hx: Alcohol: none and Recreational drug use: MJ this pregnancy  Objective:   Vitals:   05/13/20 2112  BP: 119/81  Pulse: 99  Resp: 18  Temp: 99 F (37.2 C)   Constitutional: Well nourished, well developed female in no acute distress.  HEENT: normal Skin: Warm and dry.  Cardiovascular:Regular rate and rhythm.   Extremity: trace to 1+ bilateral pedal edema Respiratory: Clear to auscultation bilateral. Normal respiratory effort Abdomen: gravid, ND, FHT present, moderate tenderness on exam Back: no CVAT Neuro: DTRs 2+, Cranial nerves grossly intact Psych: Alert and Oriented x3. No memory deficits. Normal mood and affect.  MS: normal gait, normal bilateral lower extremity ROM/strength/stability.  Pelvic exam: is not limited by body habitus EGBUS: within normal limits Vagina: within normal limits and with normal mucosa Cervix: 5/60/-3 Uterus: Spontaneous uterine activity  Adnexa: not evaluated  EFM:FHR:  140 bpm, variability: moderate,  accelerations:  Present,  decelerations:  Absent Toco: Frequency: Every 3-5 minutes   Perinatal info:  Blood type: B positive Rubella- Immune Varicella -Immune GBS NEG RPR NR / HIV Neg/ HBsAg Neg   Assessment & Plan:   35 y.o. [id number] @ [redacted]w[redacted]d, Admitted on 05/13/2020: Active Labor    Admit for labor, Observe for cervical change, Fetal Wellbeing Reassuring, Epidural when ready and AROM when Appropriate  Annamarie Major, MD, [facility], [facility] 05/13/2020  11:52 PM

## 2020-05-14 ENCOUNTER — Inpatient Hospital Stay: Payer: Medicaid Other | Admitting: Anesthesiology

## 2020-05-14 ENCOUNTER — Encounter: Payer: Self-pay | Admitting: Obstetrics & Gynecology

## 2020-05-14 DIAGNOSIS — Z3A40 40 weeks gestation of pregnancy: Secondary | ICD-10-CM | POA: Diagnosis not present

## 2020-05-14 DIAGNOSIS — O480 Post-term pregnancy: Secondary | ICD-10-CM

## 2020-05-14 LAB — CBC
HCT: 29.5 % — ABNORMAL LOW (ref 36.0–46.0)
Hemoglobin: 9.9 g/dL — ABNORMAL LOW (ref 12.0–15.0)
MCH: 21 pg — ABNORMAL LOW (ref 26.0–34.0)
MCHC: 33.6 g/dL (ref 30.0–36.0)
MCV: 62.6 fL — ABNORMAL LOW (ref 80.0–100.0)
Platelets: 208 10*3/uL (ref 150–400)
RBC: 4.71 MIL/uL (ref 3.87–5.11)
RDW: 16.2 % — ABNORMAL HIGH (ref 11.5–15.5)
WBC: 17.2 10*3/uL — ABNORMAL HIGH (ref 4.0–10.5)
nRBC: 0.1 % (ref 0.0–0.2)

## 2020-05-14 LAB — URINE DRUG SCREEN, QUALITATIVE (ARMC ONLY)
Amphetamines, Ur Screen: NOT DETECTED
Barbiturates, Ur Screen: NOT DETECTED
Benzodiazepine, Ur Scrn: NOT DETECTED
Cannabinoid 50 Ng, Ur ~~LOC~~: NOT DETECTED
Cocaine Metabolite,Ur ~~LOC~~: NOT DETECTED
MDMA (Ecstasy)Ur Screen: NOT DETECTED
Methadone Scn, Ur: NOT DETECTED
Opiate, Ur Screen: NOT DETECTED
Phencyclidine (PCP) Ur S: NOT DETECTED
Tricyclic, Ur Screen: NOT DETECTED

## 2020-05-14 LAB — RPR: RPR Ser Ql: NONREACTIVE

## 2020-05-14 LAB — RESP PANEL BY RT-PCR (FLU A&B, COVID) ARPGX2
Influenza A by PCR: NEGATIVE
Influenza B by PCR: NEGATIVE
SARS Coronavirus 2 by RT PCR: NEGATIVE

## 2020-05-14 LAB — ABO/RH: ABO/RH(D): B POS

## 2020-05-14 LAB — TYPE AND SCREEN
ABO/RH(D): B POS
Antibody Screen: NEGATIVE

## 2020-05-14 MED ORDER — PHENYLEPHRINE 40 MCG/ML (10ML) SYRINGE FOR IV PUSH (FOR BLOOD PRESSURE SUPPORT): 80.0000 ug | PREFILLED_SYRINGE | INTRAVENOUS | Status: DC | PRN

## 2020-05-14 MED ORDER — OXYTOCIN-SODIUM CHLORIDE 30-0.9 UT/500ML-% IV SOLN: 1.0000 m[IU]/min | INTRAVENOUS | Status: DC

## 2020-05-14 MED ORDER — SIMETHICONE 80 MG PO CHEW
80.0000 mg | CHEWABLE_TABLET | ORAL | Status: DC | PRN
Start: 2020-05-14 — End: 2020-05-15

## 2020-05-14 MED ORDER — TERBUTALINE SULFATE 1 MG/ML IJ SOLN: 0.2500 mg | Freq: Once | INTRAMUSCULAR | Status: DC | PRN

## 2020-05-14 MED ORDER — FENTANYL 2.5 MCG/ML W/ROPIVACAINE 0.15% IN NS 100 ML EPIDURAL (ARMC)
12.0000 mL/h | EPIDURAL | Status: DC
Start: 2020-05-14 — End: 2020-05-14
  Filled 2020-05-14: qty 100

## 2020-05-14 MED ORDER — ONDANSETRON HCL 4 MG/2ML IJ SOLN: 4.0000 mg | INTRAMUSCULAR | Status: DC | PRN

## 2020-05-14 MED ORDER — ONDANSETRON HCL 4 MG PO TABS
4.0000 mg | ORAL_TABLET | ORAL | Status: DC | PRN
  Filled 2020-05-14: qty 1

## 2020-05-14 MED ORDER — OXYCODONE HCL 5 MG PO TABS: 5.0000 mg | ORAL_TABLET | ORAL | Status: DC | PRN

## 2020-05-14 MED ORDER — FENTANYL 2.5 MCG/ML W/ROPIVACAINE 0.15% IN NS 100 ML EPIDURAL (ARMC)
EPIDURAL | Status: AC
  Administered 2020-05-14: 12 mL/h via EPIDURAL
  Filled 2020-05-14: qty 100

## 2020-05-14 MED ORDER — PRENATAL MULTIVITAMIN CH
1.0000 | ORAL_TABLET | Freq: Every day | ORAL | Status: DC
  Administered 2020-05-14 – 2020-05-15 (×2): 1 via ORAL
  Filled 2020-05-14 (×2): qty 1

## 2020-05-14 MED ORDER — OXYCODONE HCL 5 MG PO TABS: 10.0000 mg | ORAL_TABLET | ORAL | Status: DC | PRN

## 2020-05-14 MED ORDER — ACETAMINOPHEN 325 MG PO TABS
650.0000 mg | ORAL_TABLET | ORAL | Status: DC | PRN
  Administered 2020-05-14 – 2020-05-15 (×3): 650 mg via ORAL
  Filled 2020-05-14 (×3): qty 2

## 2020-05-14 MED ORDER — OXYTOCIN 10 UNIT/ML IJ SOLN
INTRAMUSCULAR | Status: AC
  Filled 2020-05-14: qty 2

## 2020-05-14 MED ORDER — EPHEDRINE 5 MG/ML INJ: 10.0000 mg | INTRAVENOUS | Status: DC | PRN

## 2020-05-14 MED ORDER — WITCH HAZEL-GLYCERIN EX PADS
1.0000 "application " | MEDICATED_PAD | CUTANEOUS | Status: DC | PRN
  Administered 2020-05-14: 1 via TOPICAL
  Filled 2020-05-14: qty 100

## 2020-05-14 MED ORDER — BENZOCAINE-MENTHOL 20-0.5 % EX AERO
1.0000 "application " | INHALATION_SPRAY | CUTANEOUS | Status: DC | PRN
  Administered 2020-05-14: 1 via TOPICAL
  Filled 2020-05-14: qty 56

## 2020-05-14 MED ORDER — LACTATED RINGERS IV SOLN
500.0000 mL | Freq: Once | INTRAVENOUS | Status: AC
  Administered 2020-05-14: 500 mL via INTRAVENOUS

## 2020-05-14 MED ORDER — DIPHENHYDRAMINE HCL 50 MG/ML IJ SOLN
12.5000 mg | INTRAMUSCULAR | Status: DC | PRN
Start: 2020-05-14 — End: 2020-05-14

## 2020-05-14 MED ORDER — FERROUS SULFATE 325 (65 FE) MG PO TABS
325.0000 mg | ORAL_TABLET | Freq: Every day | ORAL | Status: DC
  Administered 2020-05-14 – 2020-05-15 (×2): 325 mg via ORAL
  Filled 2020-05-14 (×2): qty 1

## 2020-05-14 MED ORDER — SODIUM CHLORIDE 0.9 % IV SOLN
INTRAVENOUS | Status: DC | PRN
  Administered 2020-05-14: 10 mL via EPIDURAL

## 2020-05-14 MED ORDER — LIDOCAINE-EPINEPHRINE (PF) 1.5 %-1:200000 IJ SOLN
INTRAMUSCULAR | Status: DC | PRN
  Administered 2020-05-14: 3 mL via EPIDURAL

## 2020-05-14 MED ORDER — AMMONIA AROMATIC IN INHA
RESPIRATORY_TRACT | Status: AC
  Filled 2020-05-14: qty 10

## 2020-05-14 MED ORDER — FENTANYL 2.5 MCG/ML W/ROPIVACAINE 0.15% IN NS 100 ML EPIDURAL (ARMC)
EPIDURAL | Status: DC | PRN
  Administered 2020-05-14: 12 mL/h via EPIDURAL

## 2020-05-14 MED ORDER — IBUPROFEN 600 MG PO TABS
600.0000 mg | ORAL_TABLET | Freq: Four times a day (QID) | ORAL | Status: DC
  Administered 2020-05-14 – 2020-05-15 (×5): 600 mg via ORAL
  Filled 2020-05-14 (×5): qty 1

## 2020-05-14 MED ORDER — LIDOCAINE HCL (PF) 1 % IJ SOLN
INTRAMUSCULAR | Status: DC | PRN
  Administered 2020-05-14: 3 mL via SUBCUTANEOUS

## 2020-05-14 MED ORDER — MISOPROSTOL 200 MCG PO TABS
ORAL_TABLET | ORAL | Status: AC
  Filled 2020-05-14: qty 4

## 2020-05-14 MED ORDER — ZOLPIDEM TARTRATE 5 MG PO TABS: 5.0000 mg | ORAL_TABLET | Freq: Every evening | ORAL | Status: DC | PRN

## 2020-05-14 MED ORDER — DIBUCAINE (PERIANAL) 1 % EX OINT: 1.0000 "application " | TOPICAL_OINTMENT | CUTANEOUS | Status: DC | PRN

## 2020-05-14 MED ORDER — COCONUT OIL OIL
1.0000 "application " | TOPICAL_OIL | Status: DC | PRN
  Administered 2020-05-14: 1 via TOPICAL
  Filled 2020-05-14 (×2): qty 120

## 2020-05-14 MED ORDER — DIPHENHYDRAMINE HCL 25 MG PO CAPS: 25.0000 mg | ORAL_CAPSULE | Freq: Four times a day (QID) | ORAL | Status: DC | PRN

## 2020-05-14 MED ORDER — SENNOSIDES-DOCUSATE SODIUM 8.6-50 MG PO TABS
2.0000 | ORAL_TABLET | Freq: Every day | ORAL | Status: DC
  Administered 2020-05-15: 2 via ORAL
  Filled 2020-05-14: qty 2

## 2020-05-14 NOTE — Anesthesia Preprocedure Evaluation (Signed)
Anesthesia Evaluation  Patient identified by MRN, date of birth, ID band Patient awake    Reviewed: Allergy & Precautions, NPO status , Patient's Chart, lab work & pertinent test results  History of Anesthesia Complications Negative for: history of anesthetic complications  Airway Mallampati: II  TM Distance: >3 FB Neck ROM: Full    Dental no notable dental hx. (+) Teeth Intact   Pulmonary neg pulmonary ROS, neg sleep apnea, neg COPD, Patient abstained from smoking.Not current smoker, former smoker,    Pulmonary exam normal breath sounds clear to auscultation       Cardiovascular Exercise Tolerance: Good METS(-) hypertension(-) CAD and (-) Past MI negative cardio ROS  (-) dysrhythmias  Rhythm:Regular Rate:Normal - Systolic murmurs    Neuro/Psych negative neurological ROS  negative psych ROS   GI/Hepatic neg GERD  ,(+)     (-) substance abuse  ,   Endo/Other  neg diabetes  Renal/GU negative Renal ROS     Musculoskeletal   Abdominal   Peds  Hematology  (+) Blood dyscrasia, anemia ,   Anesthesia Other Findings Past Medical History: No date: Anemia     Comment:  Patient reports anemia with her pregnancies No date: Infection     Comment:  UTI No date: Miscarriage No date: Vaginal Pap smear, abnormal  Reproductive/Obstetrics (+) Pregnancy                             Anesthesia Physical Anesthesia Plan  ASA: II  Anesthesia Plan: Epidural   Post-op Pain Management:    Induction:   PONV Risk Score and Plan: 3 and Treatment may vary due to age or medical condition and Ondansetron  Airway Management Planned: Natural Airway  Additional Equipment:   Intra-op Plan:   Post-operative Plan:   Informed Consent: I have reviewed the patients History and Physical, chart, labs and discussed the procedure including the risks, benefits and alternatives for the proposed anesthesia with the  patient or authorized representative who has indicated his/her understanding and acceptance.       Plan Discussed with: Surgeon  Anesthesia Plan Comments: (Discussed R/B/A of neuraxial anesthesia technique with patient: - rare risks of spinal/epidural hematoma, nerve damage, infection - Risk of PDPH - Risk of itching - Risk of nausea and vomiting - Risk of poor block necessitating replacement of epidural. Patient voiced understanding.)        Anesthesia Quick Evaluation

## 2020-05-14 NOTE — Progress Notes (Signed)
  Labor Progress Note   35 y.o. [id number] @ [redacted]w[redacted]d , admitted for  Pregnancy, Labor Management.   Subjective:  No pain after epidural  Objective:  BP 119/81 (BP Location: Right Arm)   Pulse 99   Temp 99 F (37.2 C) (Oral)   Resp 18   Ht 5' (1.524 m)   Wt 76.7 kg   LMP 08/02/2019   BMI 33.01 kg/m  Abd: gravid, ND, FHT present, mild tenderness on exam Extr: trace to 1+ bilateral pedal edema SVE: CERVIX: 7 cm dilated, 80 effaced, -3 station MEMBRANES: ruptured, clear fluid  EFM: FHR: 140 bpm, variability: moderate,  accelerations:  Present,  decelerations:  Absent Toco: Frequency: Every 3-4 minutes Labs: I have reviewed the patient's lab results.   Assessment & Plan:  [id number] @ [redacted]w[redacted]d, admitted for  Pregnancy and Labor/Delivery Management  1. Pain management: epidural. 2. FWB: FHT category 1.  3. ID: GBS negative 4. Labor management: AROM clear Cont laborprgression Anticpate delivery soon  All discussed with patient, see orders  Annamarie Major, MD, [facility], [facility] 05/14/2020  4:26 AM

## 2020-05-14 NOTE — Progress Notes (Signed)
Breanna Webb is stable after delivery . Pt's support person is at bedside. Pt bonding with infant and performing skin to skin after delivery. Epidural catheter removed by RN, tip intact, no bleeding noted at site. Pt is stable and ambulatory. Pt ambulated to the bathroom, voided, and tolerated activity. Pt transferred to mother/baby unit RM 346 . Report given to Vickie Epley

## 2020-05-14 NOTE — Progress Notes (Signed)
Labor Progress Note  Breanna Webb is a 35 y.o. [phone] at [redacted]w[redacted]d by LMP, confirmed by 5 week [place], admitted for active labor  Subjective: Patient resting comfortably in bed with epidural placed. Currently positioned with peanut ball on R lateral side. Patient states she intermittently feels "pressure" with contractions but not consistently.   Objective: BP 130/80 (BP Location: Left Arm)   Pulse 93   Temp 98.9 F (37.2 C) (Oral)   Resp 18   Ht 5' (1.524 m)   Wt 76.7 kg   LMP 08/02/2019   SpO2 97%   BMI 33.01 kg/m  Notable VS details: wnl  Fetal Assessment: FHT:  FHR: 140 bpm, variability: moderate,  accelerations:  Present,  decelerations:  Absent Category/reactivity:  Category I UC:   regular, every 2.5-4 minutes SVE:   7/80-90/-2 per nursing Membrane status: AROM Amniotic color: clear  Labs: Lab Results  Component Value Date   WBC 17.2 (H) 05/13/2020   HGB 9.9 (L) 05/13/2020   HCT 29.5 (L) 05/13/2020   MCV 62.6 (L) 05/13/2020   PLT 208 05/13/2020    Assessment / Plan: Protracted active phase  Labor: Protracted active phase of labor  - no documented cervical change per nursing exams in four hour period - patient s/p AROM - will initiate pitocin augmentation. Discussed plan with patient including risks and benefits, patient agreeable, all questions answered. Preeclampsia:  No S&S of PIH Fetal Wellbeing:  Category I Pain Control:  Epidural I/D:  GBS negative; Covid-19 negative Anticipated MOD:  NSVD  Zipporah Plants, CNM 05/14/2020, 8:32 AM

## 2020-05-14 NOTE — Lactation Note (Signed)
This note was copied from a baby's chart. Lactation Consultation Note  Patient Name: Breanna Webb HQION'G Date: 05/14/2020 Reason for consult: Follow-up assessment;Other (Comment) (pump set-up) Age:33 hours  Lactation follow-up. Mom moved to [facility] in room for pump set-up and education. Baby sleeping soundly in bassinet, mom awake and on her phone. LC spoke with mom about feeding plan: pumping and bottle feeding is ultimate goal.  LC set-up pump in room, explained parts/pieces, pumping frequency, cleaning, and encouragement of feeding baby with cues- mom verbalizes understanding.  Lactation name/number updated on whiteboard, encouraged her to call out for help with pump or support with putting baby to the breast.  Maternal Data Has patient been taught Hand Expression?: Yes Does the patient have breastfeeding experience prior to this delivery?: Yes How long did the patient breastfeed?: weeks (did not BF w/ first 2, tried to pump with 3rd)  Feeding Mother's Current Feeding Choice: Breast Milk  LATCH Score Latch: Grasps breast easily, tongue down, lips flanged, rhythmical sucking.  Audible Swallowing: A few with stimulation  Type of Nipple: Everted at rest and after stimulation  Comfort (Breast/Nipple): Soft / non-tender  Hold (Positioning): Assistance needed to correctly position infant at breast and maintain latch.  LATCH Score: 8   Lactation Tools Discussed/Used Tools: Pump;Coconut oil Breast pump type: Double-Electric Breast Pump Pump Education: Setup, frequency, and cleaning;Milk Storage (mom stayed on her phone the entire time education was given) Reason for Pumping: mom's choice to pump and bottle feed Pumping frequency: every 2-3 hours  Interventions Interventions: Breast feeding basics reviewed;DEBP;Education;Coconut oil  Discharge Pump: DEBP (In hospital) WIC Program: Yes  Consult Status Consult Status: PRN Date: 05/14/20 Follow-up type: Call as  needed    Breanna Webb 05/14/2020, 1:46 PM

## 2020-05-14 NOTE — Lactation Note (Signed)
This note was copied from a baby's chart. Lactation Consultation Note  Patient Name: Breanna Webb [id number] Date: 05/14/2020 Reason for consult: L&D Initial assessment;Term Age:33 hours  Lactation called to [facility] for assistance with first feeding. This is mom's 4th baby, with minimal BF experience (she attempted pumping/bottle feeding with third baby). Mom desires to provided pumped breastmilk to baby Breanna, however will attempt putting baby to breast while working to bring in an adequate milk supply. Baby calm on mom's chest. LC assisted with positioning baby across mom in modified cradle, sandwiched breast tissue. Baby once ready, grasped the breast easily and had a strong rhythmic sucking pattern, a few swallows noted by deep chin/jaw movement. LC stayed at bedside for approximately 15 minutes; baby remained active entire time, LC helped position mom's arms to continue feeding independently.   LC encouraged feedings on demand with baby to assist in bringing in milk supply, but encouragement of respecting feeding choice and plans for pump set-up once on MBU.   Maternal Data Has patient been taught Hand Expression?: Yes Does the patient have breastfeeding experience prior to this delivery?: Yes How long did the patient breastfeed?: weeks (did not BF w/ first 2, tried to pump with 3rd)  Feeding Mother's Current Feeding Choice: Breast Milk  LATCH Score Latch: Grasps breast easily, tongue down, lips flanged, rhythmical sucking.  Audible Swallowing: A few with stimulation  Type of Nipple: Everted at rest and after stimulation  Comfort (Breast/Nipple): Soft / non-tender  Hold (Positioning): Assistance needed to correctly position infant at breast and maintain latch.  LATCH Score: 8   Lactation Tools Discussed/Used    Interventions Interventions: Breast feeding basics reviewed;Assisted with latch;Hand express;Breast compression;Adjust position;Support  pillows;Education  Discharge    Consult Status Consult Status: Follow-up Date: 05/14/20 Follow-up type: In-patient    [name] 05/14/2020, 10:17 AM

## 2020-05-14 NOTE — Anesthesia Procedure Notes (Signed)
Epidural Patient location during procedure: OB Start time: 05/14/2020 12:59 AM End time: 05/14/2020 1:23 AM  Staffing Anesthesiologist: Corinda Gubler, MD Performed: anesthesiologist   Preanesthetic Checklist Completed: patient identified, IV checked, site marked, risks and benefits discussed, surgical consent, monitors and equipment checked, pre-op evaluation and timeout performed  Epidural Patient position: sitting Prep: ChloraPrep Patient monitoring: heart rate, continuous pulse ox and blood pressure Approach: midline Location: L3-L4 Injection technique: LOR saline  Needle:  Needle type: Tuohy  Needle gauge: 17 G Needle length: 9 cm and 9 Needle insertion depth: 5 cm Catheter type: closed end flexible Catheter size: 19 Gauge Catheter at skin depth: 10 cm Test dose: negative and 1.5% lidocaine with Epi 1:200 K  Assessment Sensory level: T10 Events: blood not aspirated, injection not painful, no injection resistance, no paresthesia and negative IV test  Additional Notes first attempt Pt. Evaluated and documentation done after procedure finished. Patient identified. Risks/Benefits/Options discussed with patient including but not limited to bleeding, infection, nerve damage, paralysis, failed block, incomplete pain control, headache, blood pressure changes, nausea, vomiting, reactions to medication both or allergic, itching and postpartum back pain. Confirmed with bedside nurse the patient's most recent platelet count. Confirmed with patient that they are not currently taking any anticoagulation, have any bleeding history or any family history of bleeding disorders. Patient expressed understanding and wished to proceed. All questions were answered. Sterile technique was used throughout the entire procedure. Please see nursing notes for vital signs. Test dose was given through epidural catheter and negative prior to continuing to dose epidural or start infusion. Warning signs of high  block given to the patient including shortness of breath, tingling/numbness in hands, complete motor block, or any concerning symptoms with instructions to call for help. Patient was given instructions on fall risk and not to get out of bed. All questions and concerns addressed with instructions to call with any issues or inadequate analgesia.   Patient tolerated the insertion well without immediate complications.Reason for block:procedure for pain

## 2020-05-14 NOTE — Discharge Summary (Signed)
OB Discharge Summary     Patient Name: Breanna Webb DOB: Dec 14, 1985 MRN: [id number]  Date of admission: 05/13/2020 Delivering MD: Mirna Mires, CNM  Date of Delivery: 05/15/2020  Date of discharge: 05/15/2020  Admitting diagnosis: [redacted] weeks gestation of pregnancy [Z3A.40] Intrauterine pregnancy: [redacted]w[redacted]d     Secondary diagnosis: Anemia     Discharge diagnosis: Term Pregnancy Delivered                                                                                                Post partum procedures:NA  Augmentation: AROM and Pitocin  Complications: None  Hospital course:  Onset of Labor With Vaginal Delivery      35 y.o. yo [id number] at [redacted]w[redacted]d was admitted in Active Labor on 05/13/2020. Patient had an uncomplicated labor course as follows:  Membrane Rupture Time/Date: 4:23 AM ,05/14/2020   Delivery Method:Vaginal, Spontaneous  Episiotomy: None  Lacerations:  None  Patient had an uncomplicated postpartum course.  She is ambulating, tolerating a regular diet, passing flatus, and urinating well. Patient is discharged home in stable condition on 05/15/20.  Newborn Data: Birth date:05/14/2020  Birth time:9:33 AM  Gender:Female  Living status:Living  Apgars:9 ,10  Weight:2680 g   Physical exam  Vitals:   05/14/20 1559 05/14/20 1945 05/14/20 2255 05/15/20 0740  BP: 108/74 116/71 115/81 106/78  Pulse: 75 86 80 74  Resp: 18 18 18 17   Temp: 98.2 F (36.8 C) 98.3 F (36.8 C) 98.2 F (36.8 C) 98.4 F (36.9 C)  TempSrc:  Oral Oral Oral  SpO2: 99% 98% 97% 98%  Weight:      Height:       General: alert, cooperative and no distress Lochia: appropriate Uterine Fundus: firm Incision: N/A DVT Evaluation: No evidence of DVT seen on physical exam. Negative Homan's sign.  Labs: Lab Results  Component Value Date   WBC 22.4 (H) 05/15/2020   HGB 9.4 (L) 05/15/2020   HCT 27.1 (L) 05/15/2020   MCV 62.3 (L) 05/15/2020   PLT 190 05/15/2020    Discharge instruction: per After  Visit Summary.  Medications:  Allergies as of 05/15/2020   No Known Allergies     Medication List    TAKE these medications   ferrous sulfate 325 (65 FE) MG tablet Commonly known as: FerrouSul Take 1 tablet (325 mg total) by mouth 2 (two) times daily.   multivitamin-prenatal 27-0.8 MG Tabs tablet Take 1 tablet by mouth daily at 12 noon.       Diet: routine diet  Activity: Advance as tolerated. Pelvic rest for 6 weeks.   Outpatient follow up:  Follow-up Information    05/17/2020, CNM. Call in 6 week(s).   Specialty: Obstetrics and Gynecology Why: Call to schedule routine six week postpartum visit. Contact information: [address] [place] [phone]                 Postpartum contraception: Undecided. Discussed at discharge- Leaning towards POPs, then OCPs at 6 months PP Rhogam Given postpartum: no Rubella vaccine given postpartum: no - immune Varicella vaccine given postpartum: no - immune  TDaP given antepartum or postpartum: antepartum - 04/28/20  Baby Feeding: Breast  Disposition:home with mother  SIGNED:  Mirna Mires, CNM  05/15/2020 9:55 AM

## 2020-05-15 LAB — CBC
HCT: 27.1 % — ABNORMAL LOW (ref 36.0–46.0)
Hemoglobin: 9.4 g/dL — ABNORMAL LOW (ref 12.0–15.0)
MCH: 21.6 pg — ABNORMAL LOW (ref 26.0–34.0)
MCHC: 34.7 g/dL (ref 30.0–36.0)
MCV: 62.3 fL — ABNORMAL LOW (ref 80.0–100.0)
Platelets: 190 10*3/uL (ref 150–400)
RBC: 4.35 MIL/uL (ref 3.87–5.11)
RDW: 16 % — ABNORMAL HIGH (ref 11.5–15.5)
WBC: 22.4 10*3/uL — ABNORMAL HIGH (ref 4.0–10.5)
nRBC: 0 % (ref 0.0–0.2)

## 2020-05-15 NOTE — Progress Notes (Signed)
Mother discharged.  Discharge instructions given.  Mother verbalizes understanding.  Transported by Nurse Tech.

## 2020-05-15 NOTE — Anesthesia Postprocedure Evaluation (Signed)
Anesthesia Post Note  Patient: Breanna Webb  Procedure(s) Performed: AN AD HOC LABOR EPIDURAL  Patient location during evaluation: Mother Baby Anesthesia Type: Epidural Level of consciousness: awake and alert Pain management: pain level controlled Vital Signs Assessment: post-procedure vital signs reviewed and stable Respiratory status: spontaneous breathing, nonlabored ventilation and respiratory function stable Cardiovascular status: stable Postop Assessment: no headache, no backache and epidural receding Anesthetic complications: no   No complications documented.   Last Vitals:  Vitals:   05/14/20 2255 05/15/20 0740  BP: 115/81 106/78  Pulse: 80 74  Resp: 18 17  Temp: 36.8 C 36.9 C  SpO2: 97% 98%    Last Pain:  Vitals:   05/15/20 0740  TempSrc: Oral  PainSc: 0-No pain                 Sedrick Tober B Alonza Smoker

## 2020-05-15 NOTE — Discharge Instructions (Signed)

## 2020-05-16 ENCOUNTER — Telehealth: Payer: Self-pay | Admitting: *Deleted

## 2020-05-16 NOTE — Telephone Encounter (Signed)
Transition Care Management Follow-up Telephone Call  Date of discharge and from where: 05/15/2020 [facility]  How have you been since you were released from the hospital? "Doing fine"  Any questions or concerns? No  Items Reviewed:  Did the pt receive and understand the discharge instructions provided? Yes   Medications obtained and verified? Yes   Other? No   Any new allergies since your discharge? No   Dietary orders reviewed? No  Do you have support at home? Yes   Home Care and Equipment/Supplies: Were home health services ordered? not applicable If so, what is the name of the agency? N/A  Has the agency set up a time to come to the patient's home? not applicable Were any new equipment or medical supplies ordered?  No What is the name of the medical supply agency? N/A Were you able to get the supplies/equipment? not applicable Do you have any questions related to the use of the equipment or supplies? No  Functional Questionnaire: (I = Independent and D = Dependent) ADLs: I  Bathing/Dressing- I  Meal Prep- I  Eating- I  Maintaining continence- I  Transferring/Ambulation- I  Managing Meds- I  Follow up appointments reviewed:   PCP Hospital f/u appt confirmed? No    Specialist Hospital f/u appt confirmed? No    Are transportation arrangements needed? No   If their condition worsens, is the pt aware to call PCP or go to the Emergency Dept.? Yes  Was the patient provided with contact information for the PCP's office or ED? Yes  Was to pt encouraged to call back with questions or concerns? Yes

## 2020-06-25 ENCOUNTER — Encounter: Payer: Self-pay | Admitting: Obstetrics and Gynecology

## 2020-06-25 ENCOUNTER — Other Ambulatory Visit: Payer: Self-pay

## 2020-06-25 ENCOUNTER — Ambulatory Visit (INDEPENDENT_AMBULATORY_CARE_PROVIDER_SITE_OTHER): Payer: Medicaid Other | Admitting: Obstetrics and Gynecology

## 2020-06-25 DIAGNOSIS — Z30011 Encounter for initial prescription of contraceptive pills: Secondary | ICD-10-CM

## 2020-06-25 MED ORDER — NORETHINDRONE 0.35 MG PO TABS: 1.0000 | ORAL_TABLET | Freq: Every day | ORAL | 11 refills | Status: DC

## 2020-06-25 NOTE — Progress Notes (Signed)
Postpartum Visit  Chief Complaint:  Chief Complaint  Patient presents with  . Postpartum Care    Interested in [facility]    History of Present Illness: Patient is a 35 y.o. [id number] presents for postpartum visit.  Type of delivery: Vaginal delivery - Vacuum or forceps assisted  no Episiotomy No.  Laceration: no  Pregnancy or labor problems:  no Any problems since the delivery:  no  Newborn Details:  SINGLETON :  1. BabyGender female. Birth weight:   Maternal Details:  Breast or formula feeding: breast feeding with formula supplementation Intercourse: Yes  Contraception after delivery: Start POPs today Any bowel or bladder issues: No  Post partum depression/anxiety noted:  no Edinburgh Post-Partum Depression Score: 4 Date of last PAP: 09/2019  NIL and HR HPV negative   Review of Systems: ROS  The following portions of the patient's history were reviewed and updated as appropriate: allergies, current medications, past family history, past medical history, past social history, past surgical history and problem list.  Past Medical History:  Past Medical History:  Diagnosis Date  . Anemia    Patient reports anemia with her pregnancies  . Infection    UTI  . Miscarriage   . Vaginal Pap smear, abnormal     Past Surgical History:  Past Surgical History:  Procedure Laterality Date  . galbladder removed      Family History:  Family History  Problem Relation Age of Onset  . Healthy Mother   . Healthy Father     Social History:  Social History   Socioeconomic History  . Marital status: Significant Other    Spouse name: Kamotia  . Number of children: 3  . Years of education: Not on file  . Highest education level: Not on file  Occupational History  . Not on file  Tobacco Use  . Smoking status: Former Games developer  . Smokeless tobacco: Never Used  . Tobacco comment: quit 2015  Vaping Use  . Vaping Use: Never used  Substance and Sexual Activity  . Alcohol use: Not  Currently    Comment: 2-3 per week when not pregnant  . Drug use: Not Currently    Types: Marijuana    Comment: smoking when zofran not working,  last January   . Sexual activity: Yes    Birth control/protection: None  Other Topics Concern  . Not on file  Social History Narrative  . Not on file   Social Determinants of Health   Financial Resource Strain: Not on file  Food Insecurity: Not on file  Transportation Needs: Not on file  Physical Activity: Not on file  Stress: Not on file  Social Connections: Not on file  Intimate Partner Violence: Not At Risk  . Fear of Current or Ex-Partner: No  . Emotionally Abused: No  . Physically Abused: No  . Sexually Abused: No    Allergies:  No Known Allergies  Medications: Prior to Admission medications   Medication Sig Start Date End Date Taking? Authorizing Provider  ferrous sulfate (FERROUSUL) 325 (65 FE) MG tablet Take 1 tablet (325 mg total) by mouth 2 (two) times daily. 02/26/20  Yes Zaydyn Havey, CNM  norethindrone (MICRONOR) 0.35 MG tablet Take 1 tablet (0.35 mg total) by mouth daily. 06/25/20  Yes Fayetta Sorenson, Konrad Felix, CNM  Prenatal Vit-Fe Fumarate-FA (MULTIVITAMIN-PRENATAL) 27-0.8 MG TABS tablet Take 1 tablet by mouth daily at 12 noon.    Yes [provider]    Physical Exam Blood pressure 116/90, height  5' (1.524 m), weight 160 lb (72.6 kg), currently breastfeeding.    General: NAD HEENT: normocephalic, anicteric Pulmonary: No increased work of breathing Abdomen: NABS, soft, non-tender, non-distended.  Umbilicus without lesions.  No hepatomegaly, splenomegaly or masses palpable. No evidence of hernia. Genitourinary:  External: Normal external female genitalia.  Normal urethral meatus, normal  Bartholin's and Skene's glands.    Vagina: Normal vaginal mucosa, no evidence of prolapse.    Cervix: Grossly normal in appearance, no bleeding  Uterus: Non-enlarged, mobile, normal contour.  No CMT  Adnexa: ovaries  non-enlarged, no adnexal masses  Rectal: deferred Extremities: no edema, erythema, or tenderness Neurologic: Grossly intact Psychiatric: mood appropriate, affect full    Assessment: 35 y.o. [id number] presenting for 6 week postpartum visit  Plan: Problem List Items Addressed This Visit      Other   Postpartum care following vaginal delivery - Primary    Other Visit Diagnoses    Encounter for initial prescription of contraceptive pills       Relevant Medications   norethindrone (MICRONOR) 0.35 MG tablet       1) Contraception - Education given regarding options for contraception, as well as compatibility with breast feeding if applicable.  Patient plans on oral progesterone-only contraceptive for contraception.  2)  Pap - ASCCP guidelines and rational discussed.  ASCCP guidelines and rational discussed.  Patient opts for every 5 years screening interval  3) Patient underwent screening for postpartum depression with no signs of depression  4) Return in about 1 year (around 06/25/2021).   Zipporah Plants, CNM, MSN [facility], [facility] 06/25/2020, 10:44 AM

## 2020-09-29 HISTORY — PX: SALPINGECTOMY: SHX328

## 2020-10-04 ENCOUNTER — Encounter: Payer: Self-pay | Admitting: Emergency Medicine

## 2020-10-04 ENCOUNTER — Other Ambulatory Visit: Payer: Self-pay

## 2020-10-04 DIAGNOSIS — Z5321 Procedure and treatment not carried out due to patient leaving prior to being seen by health care provider: Secondary | ICD-10-CM | POA: Diagnosis not present

## 2020-10-04 DIAGNOSIS — M545 Low back pain, unspecified: Secondary | ICD-10-CM | POA: Insufficient documentation

## 2020-10-04 DIAGNOSIS — R1030 Lower abdominal pain, unspecified: Secondary | ICD-10-CM | POA: Diagnosis present

## 2020-10-04 DIAGNOSIS — R319 Hematuria, unspecified: Secondary | ICD-10-CM | POA: Diagnosis not present

## 2020-10-04 DIAGNOSIS — R110 Nausea: Secondary | ICD-10-CM | POA: Insufficient documentation

## 2020-10-04 LAB — URINALYSIS, COMPLETE (UACMP) WITH MICROSCOPIC
Bilirubin Urine: NEGATIVE
Glucose, UA: NEGATIVE mg/dL
Ketones, ur: 5 mg/dL — AB
Leukocytes,Ua: NEGATIVE
Nitrite: NEGATIVE
Protein, ur: NEGATIVE mg/dL
Specific Gravity, Urine: 1.023 (ref 1.005–1.030)
pH: 6 (ref 5.0–8.0)

## 2020-10-04 LAB — COMPREHENSIVE METABOLIC PANEL
ALT: 17 U/L (ref 0–44)
AST: 18 U/L (ref 15–41)
Albumin: 3.7 g/dL (ref 3.5–5.0)
Alkaline Phosphatase: 48 U/L (ref 38–126)
Anion gap: 5 (ref 5–15)
BUN: 7 mg/dL (ref 6–20)
CO2: 23 mmol/L (ref 22–32)
Calcium: 8.4 mg/dL — ABNORMAL LOW (ref 8.9–10.3)
Chloride: 105 mmol/L (ref 98–111)
Creatinine, Ser: 0.53 mg/dL (ref 0.44–1.00)
GFR, Estimated: >60 mL/min (ref >60)
Glucose, Bld: 153 mg/dL — ABNORMAL HIGH (ref 70–99)
Potassium: 3.7 mmol/L (ref 3.5–5.1)
Sodium: 133 mmol/L — ABNORMAL LOW (ref 135–145)
Total Bilirubin: 0.8 mg/dL (ref 0.3–1.2)
Total Protein: 7 g/dL (ref 6.5–8.1)

## 2020-10-04 LAB — CBC
HCT: 30.2 % — ABNORMAL LOW (ref 36.0–46.0)
Hemoglobin: 10 g/dL — ABNORMAL LOW (ref 12.0–15.0)
MCH: 20.4 pg — ABNORMAL LOW (ref 26.0–34.0)
MCHC: 33.1 g/dL (ref 30.0–36.0)
MCV: 61.5 fL — ABNORMAL LOW (ref 80.0–100.0)
Platelets: 360 10*3/uL (ref 150–400)
RBC: 4.91 MIL/uL (ref 3.87–5.11)
RDW: 16.7 % — ABNORMAL HIGH (ref 11.5–15.5)
WBC: 19.1 10*3/uL — ABNORMAL HIGH (ref 4.0–10.5)
nRBC: 0 % (ref 0.0–0.2)

## 2020-10-04 LAB — LIPASE, BLOOD: Lipase: 21 U/L (ref 11–51)

## 2020-10-04 LAB — POC URINE PREG, ED: Preg Test, Ur: POSITIVE — AB

## 2020-10-04 NOTE — ED Triage Notes (Signed)
Pt to ED via POV with c/o lower abd pain and back pain that started earlier today. Pt c/o nausea, denies vomiting. Pt also c/o lower back pain. Pt denies urinary symptoms, states feels like her vaginal pH is off and has called her PCP for a pap smear, appt is 8/22. Pt also c/o hematuria at this time.

## 2020-10-05 ENCOUNTER — Ambulatory Visit
Admission: EM | Admit: 2020-10-05 | Discharge: 2020-10-05 | Disposition: A | Discharge status: Home / Self Care | Payer: Medicaid Other | Attending: Sports Medicine | Admitting: Sports Medicine

## 2020-10-05 ENCOUNTER — Other Ambulatory Visit: Payer: Self-pay

## 2020-10-05 ENCOUNTER — Emergency Department
Admission: EM | Admit: 2020-10-05 | Discharge: 2020-10-05 | Disposition: A | Discharge status: Home / Self Care | Payer: Medicaid Other | Attending: Emergency Medicine | Admitting: Emergency Medicine

## 2020-10-05 ENCOUNTER — Encounter: Payer: Self-pay | Admitting: Emergency Medicine

## 2020-10-05 DIAGNOSIS — R319 Hematuria, unspecified: Secondary | ICD-10-CM

## 2020-10-05 DIAGNOSIS — R300 Dysuria: Secondary | ICD-10-CM | POA: Diagnosis not present

## 2020-10-05 DIAGNOSIS — R102 Pelvic and perineal pain: Secondary | ICD-10-CM | POA: Diagnosis not present

## 2020-10-05 DIAGNOSIS — N939 Abnormal uterine and vaginal bleeding, unspecified: Secondary | ICD-10-CM | POA: Insufficient documentation

## 2020-10-05 DIAGNOSIS — R350 Frequency of micturition: Secondary | ICD-10-CM

## 2020-10-05 DIAGNOSIS — R3915 Urgency of urination: Secondary | ICD-10-CM

## 2020-10-05 DIAGNOSIS — R1084 Generalized abdominal pain: Secondary | ICD-10-CM | POA: Diagnosis not present

## 2020-10-05 LAB — POCT URINALYSIS DIP (DEVICE)
Glucose, UA: NEGATIVE mg/dL
Leukocytes,Ua: NEGATIVE
Nitrite: POSITIVE — AB
Protein, ur: 100 mg/dL — AB
Specific Gravity, Urine: >=1.03 (ref 1.005–1.030)
Urobilinogen, UA: 1 mg/dL (ref 0.0–1.0)
pH: 5.5 (ref 5.0–8.0)

## 2020-10-05 LAB — HCG, QUANTITATIVE, PREGNANCY: hCG, Beta Chain, Quant, S: 16665 m[IU]/mL — ABNORMAL HIGH (ref <5)

## 2020-10-05 MED ORDER — NITROFURANTOIN MONOHYD MACRO 100 MG PO CAPS: 100.0000 mg | ORAL_CAPSULE | Freq: Two times a day (BID) | ORAL | 0 refills | Status: DC

## 2020-10-05 NOTE — ED Triage Notes (Signed)
Pt c/o lower abd pain and vaginal bleeding. Pt states the pain got worse yesterday and went up into her chest. Pt states she has had vaginal bleeding and spotting since July. Pt denies frequent urination or dysuria. Pt states she has had white thick discharge.

## 2020-10-05 NOTE — ED Provider Notes (Signed)
[facility]    CSN: [id number] Arrival date & time: 10/05/20  1205      History   Chief Complaint Chief Complaint  Patient presents with   Vaginal Bleeding   Abdominal Pain    HPI Breanna Webb is a 35 y.o. female.   35 year old female presents for evaluation of lower abdominal pain and bilateral flank pain since yesterday.  She denies any fever shakes chills.  She recently gave birth 4 months ago.  She is not currently breast-feeding.  Normally goes to [facility].  She is followed by [facility].  She also reports some vaginal bleeding and spotting for about a month.  She is attempting to get into her OB/GYN.  She also reports some vaginal discharge that is whitish.  No history of kidney stones.  She is concerned that she may have a UTI.  She reports that she started getting some dysuria yesterday.  She reports that she is not really drinking a lot of water and she knows she needs to flush her system.  She reports some minimal increased urinary frequency and urgency.  No fevers shakes chills.  No nausea vomiting or diarrhea.  She reports maybe a few loose stools.  No chest pain or shortness of breath.  She reports that she was breast-feeding but has discontinued that.  No red flag signs or symptoms elicited on history.   Past Medical History:  Diagnosis Date   Anemia    Patient reports anemia with her pregnancies   Infection    UTI   Miscarriage    Vaginal Pap smear, abnormal     Patient Active Problem List   Diagnosis Date Noted   Postpartum care following vaginal delivery 05/15/2020    Past Surgical History:  Procedure Laterality Date   galbladder removed      OB History     Gravida  5   Para  4   Term  4   Preterm      AB  1   Living  4      SAB  1   IAB      Ectopic      Multiple  0   Live Births  4            Home Medications    Prior to Admission medications   Medication Sig Start Date End Date Taking?  Authorizing Provider  nitrofurantoin, macrocrystal-monohydrate, (MACROBID) 100 MG capsule Take 1 capsule (100 mg total) by mouth 2 (two) times daily. 10/05/20  Yes Delton See, MD  ferrous sulfate (FERROUSUL) 325 (65 FE) MG tablet Take 1 tablet (325 mg total) by mouth 2 (two) times daily. 02/26/20   Zipporah Plants, CNM    Family History Family History  Problem Relation Age of Onset   Healthy Mother    Healthy Father     Social History Social History   Tobacco Use   Smoking status: Former   Smokeless tobacco: Never   Tobacco comments:    quit 2015  Vaping Use   Vaping Use: Never used  Substance Use Topics   Alcohol use: Not Currently    Comment: 2-3 per week when not pregnant   Drug use: Not Currently    Types: Marijuana    Comment: smoking when zofran not working,  last January      Allergies   Patient has no known allergies.   Review of Systems Review of Systems  Constitutional:  Negative for activity change,  appetite change, chills, diaphoresis, fatigue and fever.  HENT:  Negative for congestion, ear pain, postnasal drip, rhinorrhea, sinus pressure, sinus pain, sneezing and sore throat.   Eyes:  Negative for pain.  Respiratory:  Negative for cough, chest tightness and shortness of breath.   Cardiovascular:  Negative for chest pain and palpitations.  Gastrointestinal:  Positive for abdominal pain. Negative for diarrhea, nausea and vomiting.  Genitourinary:  Positive for dysuria, flank pain, frequency, hematuria, menstrual problem, urgency, vaginal bleeding and vaginal discharge. Negative for pelvic pain and vaginal pain.  Musculoskeletal:  Negative for back pain, myalgias and neck pain.  Skin:  Negative for color change, pallor, rash and wound.  Neurological:  Negative for dizziness, syncope, light-headedness, numbness and headaches.  All other systems reviewed and are negative.   Physical Exam Triage Vital Signs ED Triage Vitals  Enc Vitals Group     BP  10/05/20 1222 113/80     Pulse Rate 10/05/20 1222 83     Resp 10/05/20 1222 17     Temp 10/05/20 1222 97.9 F (36.6 C)     Temp Source 10/05/20 1222 Oral     SpO2 10/05/20 1222 100 %     Weight --      Height --      Head Circumference --      Peak Flow --      Pain Score 10/05/20 1218 7     Pain Loc --      Pain Edu? --      Excl. in GC? --    No data found.  Updated Vital Signs BP 113/80 (BP Location: Left Arm)   Pulse 83   Temp 97.9 F (36.6 C) (Oral)   Resp 17   LMP 09/07/2020   SpO2 100%   Visual Acuity Right Eye Distance:   Left Eye Distance:   Bilateral Distance:    Right Eye Near:   Left Eye Near:    Bilateral Near:     Physical Exam Vitals and nursing note reviewed.  Constitutional:      General: She is not in acute distress.    Appearance: Normal appearance. She is well-developed. She is not ill-appearing, toxic-appearing or diaphoretic.  HENT:     Head: Normocephalic and atraumatic.     Nose: Nose normal.     Mouth/Throat:     Mouth: Mucous membranes are moist.  Eyes:     General: No scleral icterus.    Extraocular Movements: Extraocular movements intact.     Conjunctiva/sclera: Conjunctivae normal.     Pupils: Pupils are equal, round, and reactive to light.  Cardiovascular:     Rate and Rhythm: Normal rate and regular rhythm.     Pulses: Normal pulses.     Heart sounds: Normal heart sounds. No murmur heard.   No friction rub. No gallop.  Pulmonary:     Effort: Pulmonary effort is normal.     Breath sounds: Normal breath sounds. No stridor. No wheezing, rhonchi or rales.  Abdominal:     General: Abdomen is protuberant. Bowel sounds are normal. There are no signs of injury.     Tenderness: There is abdominal tenderness in the suprapubic area. There is guarding. There is no right CVA tenderness, left CVA tenderness or rebound. Negative signs include Murphy's sign, Rovsing's sign and McBurney's sign.     Comments: Protuberant from recent  childbirth  Musculoskeletal:     Cervical back: Normal range of motion and neck supple.  Skin:  General: Skin is warm and dry.     Capillary Refill: Capillary refill takes less than 2 seconds.     Coloration: Skin is not jaundiced.     Findings: No erythema or rash.  Neurological:     General: No focal deficit present.     Mental Status: She is alert and oriented to person, place, and time.     UC Treatments / Results  Labs (all labs ordered are listed, but only abnormal results are displayed) Labs Reviewed  POCT URINALYSIS DIP (DEVICE) - Abnormal; Notable for the following components:      Result Value   Bilirubin Urine MODERATE (*)    Ketones, ur TRACE (*)    Hgb urine dipstick LARGE (*)    Protein, ur 100 (*)    Nitrite POSITIVE (*)    All other components within normal limits  URINE CULTURE  POCT URINALYSIS DIPSTICK, ED / UC  CERVICOVAGINAL ANCILLARY ONLY    EKG   Radiology No results found.  Procedures Procedures (including critical care time)  Medications Ordered in UC Medications - No data to display  Initial Impression / Assessment and Plan / UC Course  I have reviewed the triage vital signs and the nursing notes.  Pertinent labs & imaging results that were available during my care of the patient were reviewed by me and considered in my medical decision making (see chart for details).  Clinical impression: 1.  Generalized abdominal pain 2.  Suprapubic abdominal discomfort. 3.  Dysuria 4.  Increased urinary frequency and urgency 5.  Abnormal vaginal bleeding with hematuria.  Treatment plan: 1.  The findings and treatment plan were discussed in detail with the patient.  Patient was in agreement. 2.  Recommended getting a UA.  The results are above.  It is concerning for UTI.  We will treat her with Macrobid for 5 days.  Sent off the culture. 3.  I educated her that she need to flush her system with plenty of water. 4.  Given that she is having some  spotting and vaginal bleeding I have recommended that she contact her primary care provider and OB/GYN and get seen as soon as possible especially now that she is 4 months postpartum. 5.  Educational handouts were provided. 6.  We discussed the fact that I do not have access to lab or advanced imaging and could only do a UA today.  If her symptoms persist I advised her that she needed to go to the ER or call 911.  She assured me that she would do so and voiced verbal understanding. 7.  I have advised her that she needed to follow-up with her primary care physician in the next 48 to 72 hours for reevaluation and to ensure that she is going in the appropriate direction. 8.  She was discharged in stable condition and will follow-up here as needed.    Final Clinical Impressions(s) / UC Diagnoses   Final diagnoses:  Generalized abdominal pain  Abdominal pain, suprapubic  Dysuria  Urinary frequency  Urinary urgency  Hematuria, unspecified type  Abnormal vaginal bleeding     Discharge Instructions      As we discussed, your urine does show that you have a urinary tract infection.  I will treat you with an antibiotic.  Sent off your culture to confirm the results.  Someone may contact you and switch her antibiotic.  If the culture does not grow anything someone may contact you and tell you to stop  the antibiotic. As we discussed, you need to flush her system with plenty of water. You are having what we termed abnormal uterine or dysfunctional uterine bleeding.  I need you to call your primary care physician and/or your OB/GYN and be seen.  Please see educational handouts. If your symptoms do not improve on the antibiotic or they worsen in any way then please go to the emergency room.  As we discussed I do not have the ability to do any labs or advanced imaging in the urgent care on a Sunday. Please follow-up with your primary care physician within the next 48 to 72 hours to ensure that you are  improving.      ED Prescriptions     Medication Sig Dispense Auth. Provider   nitrofurantoin, macrocrystal-monohydrate, (MACROBID) 100 MG capsule Take 1 capsule (100 mg total) by mouth 2 (two) times daily. 10 capsule Delton SeeBarnes, Sumayyah Custodio, MD      PDMP not reviewed this encounter.   Delton SeeBarnes, Allean Montfort, MD 10/05/20 [phone]

## 2020-10-05 NOTE — Discharge Instructions (Addendum)
As we discussed, your urine does show that you have a urinary tract infection.  I will treat you with an antibiotic.  Sent off your culture to confirm the results.  Someone may contact you and switch her antibiotic.  If the culture does not grow anything someone may contact you and tell you to stop the antibiotic. As we discussed, you need to flush her system with plenty of water. You are having what we termed abnormal uterine or dysfunctional uterine bleeding.  I need you to call your primary care physician and/or your OB/GYN and be seen.  Please see educational handouts. If your symptoms do not improve on the antibiotic or they worsen in any way then please go to the emergency room.  As we discussed I do not have the ability to do any labs or advanced imaging in the urgent care on a Sunday. Please follow-up with your primary care physician within the next 48 to 72 hours to ensure that you are improving.

## 2020-10-06 ENCOUNTER — Telehealth: Payer: Self-pay

## 2020-10-06 LAB — CERVICOVAGINAL ANCILLARY ONLY
Bacterial Vaginitis (gardnerella): POSITIVE — AB
Candida Glabrata: NEGATIVE
Candida Vaginitis: NEGATIVE
Chlamydia: NEGATIVE
Comment: NEGATIVE
Comment: NEGATIVE
Comment: NEGATIVE
Comment: NEGATIVE
Comment: NEGATIVE
Comment: NORMAL
Neisseria Gonorrhea: NEGATIVE
Trichomonas: NEGATIVE

## 2020-10-06 LAB — URINE CULTURE: Culture: <10000 — AB

## 2020-10-06 NOTE — Telephone Encounter (Signed)
Transition Care Management Follow-up Telephone Call Date of discharge and from where: 10/05/2020-[facility] How have you been since you were released from the hospital? Patient stated she is doing fine. She LWBS at the ED. Any questions or concerns? No  Items Reviewed: Did the pt receive and understand the discharge instructions provided?  LWBS Medications obtained and verified?  LWBS Other? No  Any new allergies since your discharge? No  Dietary orders reviewed? N/A Do you have support at home? Yes   Home Care and Equipment/Supplies: Were home health services ordered? not applicable If so, what is the name of the agency? N/A  Has the agency set up a time to come to the patient's home? not applicable Were any new equipment or medical supplies ordered?  No What is the name of the medical supply agency? N/A Were you able to get the supplies/equipment? not applicable Do you have any questions related to the use of the equipment or supplies? No  Functional Questionnaire: (I = Independent and D = Dependent) ADLs: I  Bathing/Dressing- I  Meal Prep- I  Eating- I  Maintaining continence- I  Transferring/Ambulation- I  Managing Meds- I  Follow up appointments reviewed:  PCP Hospital f/u appt confirmed? No patient LWBS  Specialist Hospital f/u appt confirmed? No  Patient LWBS Are transportation arrangements needed? No  If their condition worsens, is the pt aware to call PCP or go to the Emergency Dept.? Yes Was the patient provided with contact information for the PCP's office or ED? Yes Was to pt encouraged to call back with questions or concerns? Yes

## 2020-10-07 ENCOUNTER — Telehealth (HOSPITAL_COMMUNITY): Payer: Self-pay | Admitting: Emergency Medicine

## 2020-10-07 MED ORDER — METRONIDAZOLE 500 MG PO TABS: 500.0000 mg | ORAL_TABLET | Freq: Two times a day (BID) | ORAL | 0 refills | Status: DC

## 2020-10-09 ENCOUNTER — Telehealth: Payer: Self-pay | Admitting: Medical Oncology

## 2020-10-09 NOTE — Telephone Encounter (Signed)
LWBS f/u- pt seen and left ED prior to being seen, it is noted that she was seen by MD at [facility] urgent care the next day. Pt was going to be advised to return to ED.

## 2020-10-16 DIAGNOSIS — O00102 Left tubal pregnancy without intrauterine pregnancy: Secondary | ICD-10-CM | POA: Diagnosis not present

## 2020-10-16 DIAGNOSIS — O26851 Spotting complicating pregnancy, first trimester: Secondary | ICD-10-CM | POA: Diagnosis not present

## 2020-10-16 DIAGNOSIS — K661 Hemoperitoneum: Secondary | ICD-10-CM | POA: Diagnosis not present

## 2020-10-16 DIAGNOSIS — Z3A00 Weeks of gestation of pregnancy not specified: Secondary | ICD-10-CM | POA: Diagnosis not present

## 2020-10-16 DIAGNOSIS — D649 Anemia, unspecified: Secondary | ICD-10-CM | POA: Insufficient documentation

## 2020-10-20 ENCOUNTER — Telehealth: Payer: Self-pay

## 2020-10-20 NOTE — Telephone Encounter (Signed)
Transition Care Management Follow-up Telephone Call Date of discharge and from where: 10/17/2020-[facility]  How have you been since you were released from the hospital? Patient stated she is doing fine  Any questions or concerns? No  Items Reviewed: Did the pt receive and understand the discharge instructions provided? Yes  Medications obtained and verified? Yes  Other? No  Any new allergies since your discharge? No  Dietary orders reviewed? N/A Do you have support at home? Yes   Home Care and Equipment/Supplies: Were home health services ordered? not applicable If so, what is the name of the agency? N/A  Has the agency set up a time to come to the patient's home? not applicable Were any new equipment or medical supplies ordered?  No What is the name of the medical supply agency? N/A Were you able to get the supplies/equipment? not applicable Do you have any questions related to the use of the equipment or supplies? No  Functional Questionnaire: (I = Independent and D = Dependent) ADLs: I  Bathing/Dressing- I  Meal Prep- I  Eating- I  Maintaining continence- I  Transferring/Ambulation- I  Managing Meds- I  Follow up appointments reviewed:  PCP Hospital f/u appt confirmed? No   Specialist Hospital f/u appt confirmed? No   Are transportation arrangements needed? No  If their condition worsens, is the pt aware to call PCP or go to the Emergency Dept.? Yes Was the patient provided with contact information for the PCP's office or ED? Yes Was to pt encouraged to call back with questions or concerns? Yes

## 2021-02-13 ENCOUNTER — Ambulatory Visit (LOCAL_COMMUNITY_HEALTH_CENTER): Payer: Medicaid Other

## 2021-02-13 ENCOUNTER — Other Ambulatory Visit: Payer: Self-pay

## 2021-02-13 VITALS — BP 125/83 | Ht 60.0 in | Wt 168.0 lb

## 2021-02-13 DIAGNOSIS — Z3201 Encounter for pregnancy test, result positive: Secondary | ICD-10-CM

## 2021-02-13 LAB — PREGNANCY, URINE: Preg Test, Ur: POSITIVE — AB

## 2021-02-13 MED ORDER — PRENATAL 27-0.8 MG PO TABS: 1.0000 | ORAL_TABLET | Freq: Every day | ORAL | 0 refills | Status: AC

## 2021-02-13 NOTE — Progress Notes (Addendum)
UPT positive. Plans prenatal care at [facility]. Pt reports ectopic preg and L fallopian tube surgically removed 09/2020. Consult E Sciora, CNM who advises pt to establish prenatal care ASAP and for RN to counsel pt on s/s ectopic preg, such as bleeding, pain and to seek immediate med attn if notices any of these signs. RN counseled pt on provider recommendations. Pt in agreement. Plans to contact DSS for medicaid/preg women. Jerel Shepherd, RN  Consulted on the plan of care for this client.  I agree with the documented note and actions taken to provide care for this client.  Hazle Coca, CNM

## 2021-02-15 IMAGING — US US ABDOMEN LIMITED
1 series · 13 of 25 positions shown · non-contrast
Comparison: None.

CLINICAL DATA: Recurrent biliary colic.  Thirty weeks pregnant.

EXAM:
ULTRASOUND ABDOMEN LIMITED RIGHT UPPER QUADRANT

[Series 1: us abdomen limited · 0.18mm/px · 13 of 53 slices shown]
[im 1/53]
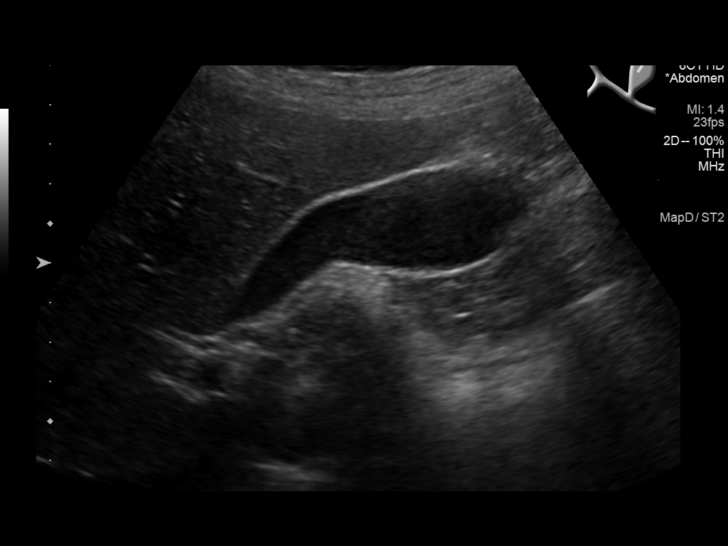
[im 5/53]
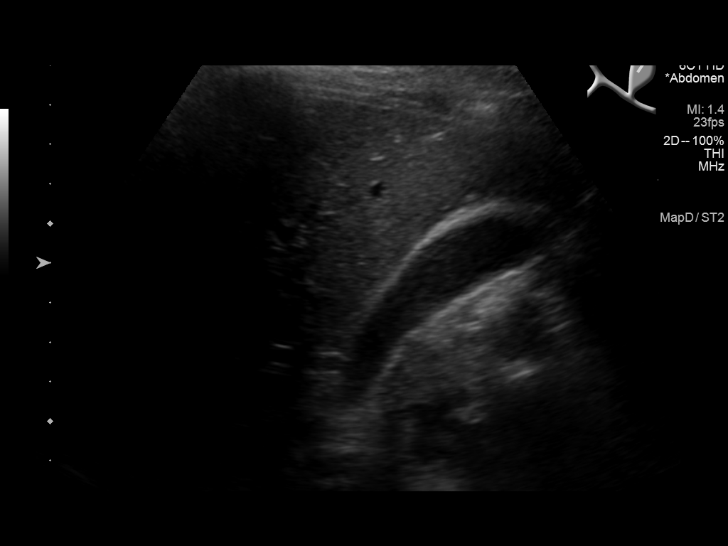
[im 9/53]
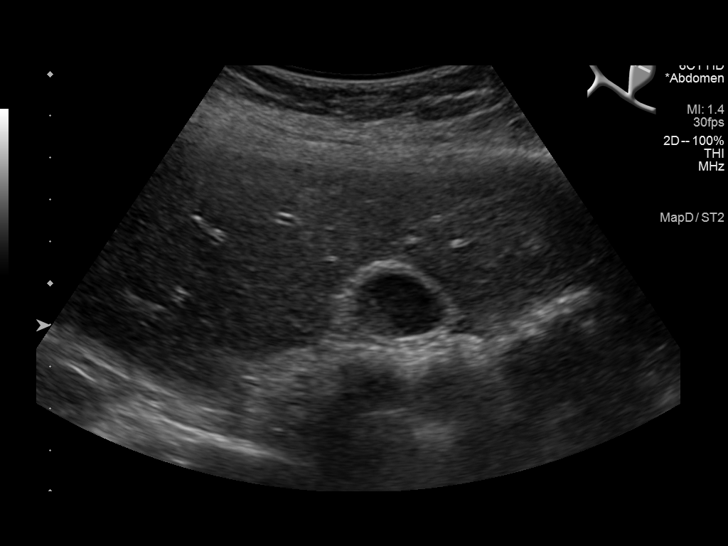
[im 14/53]
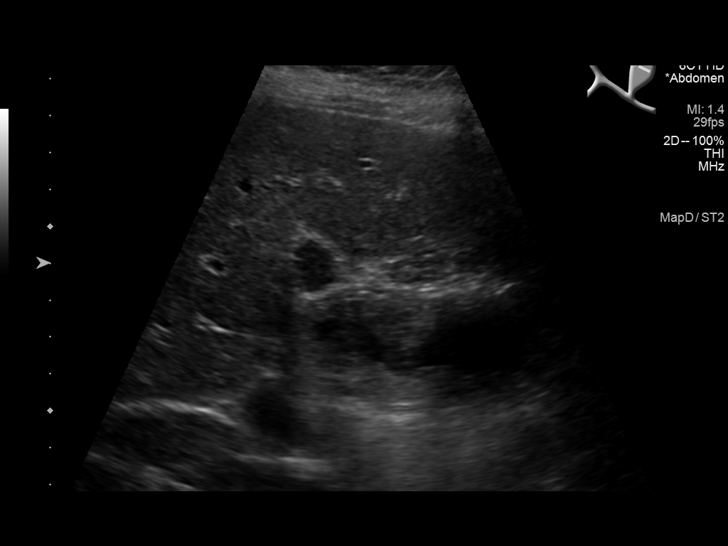
[im 18/53]
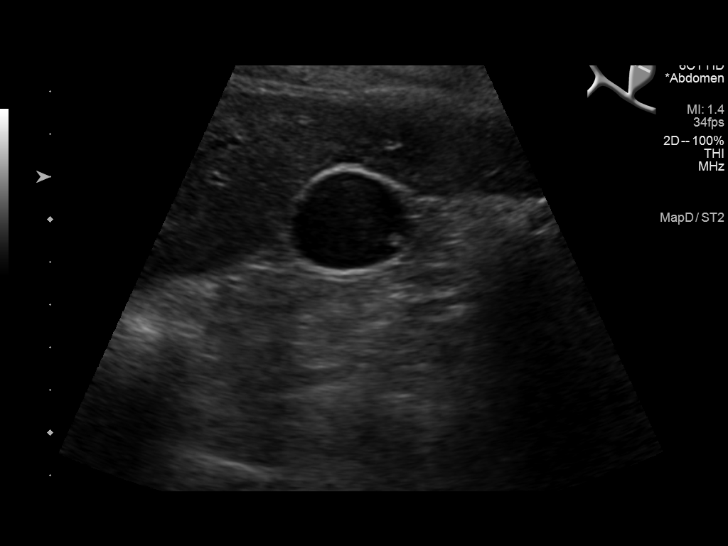
[im 22/53]
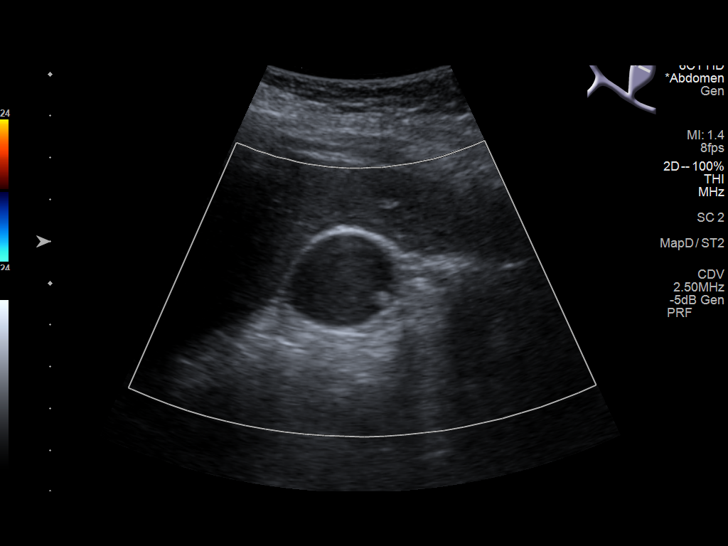
[im 27/53]
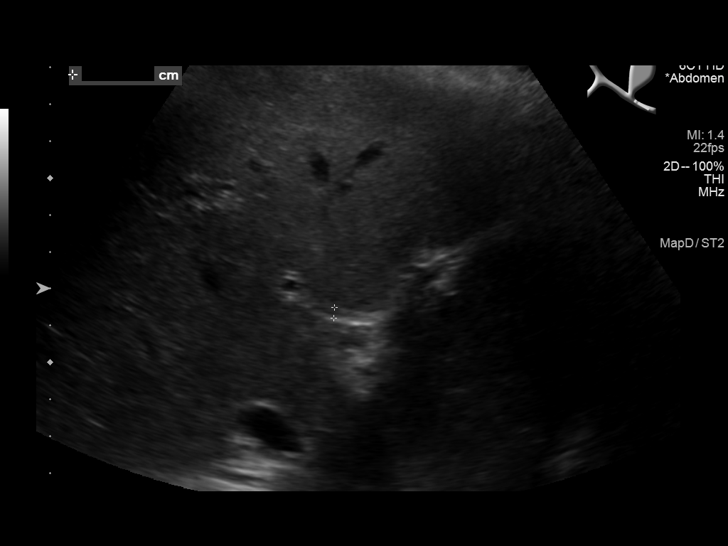
[im 31/53]
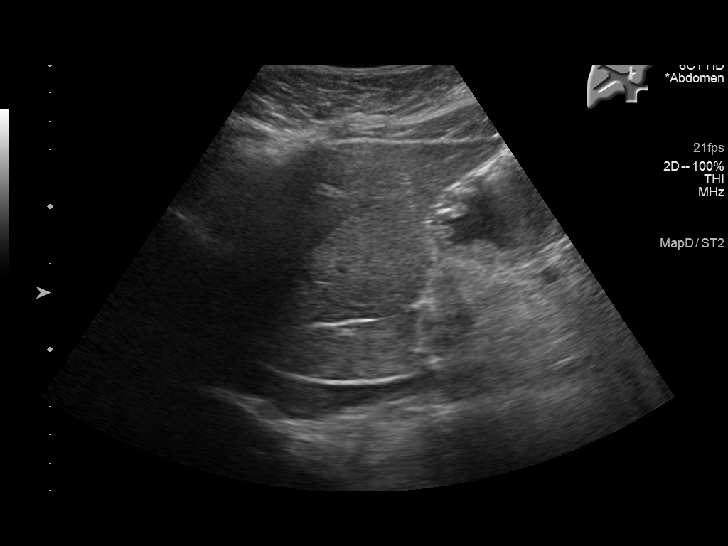
[im 35/53]
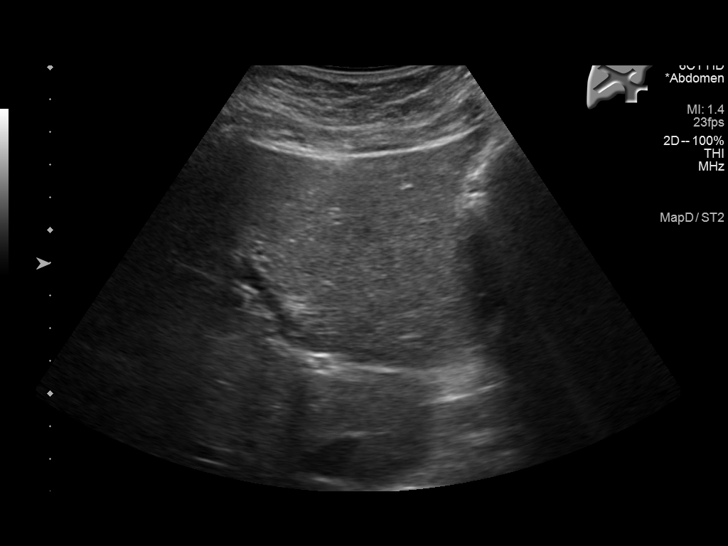
[im 40/53]
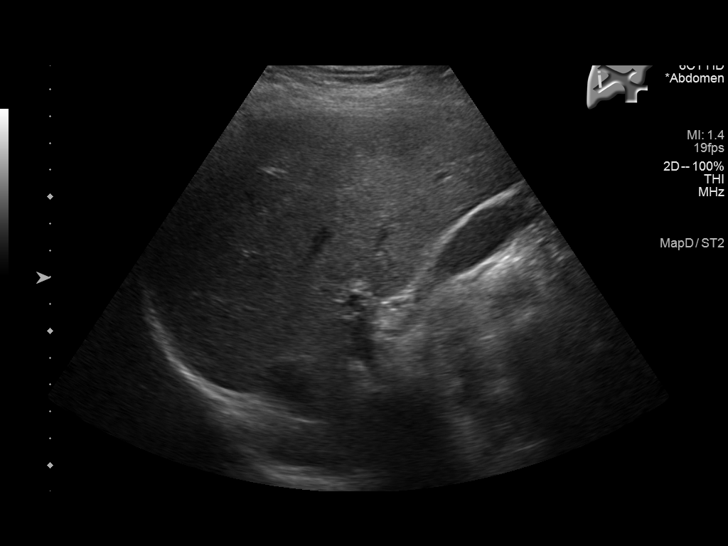
[im 44/53]
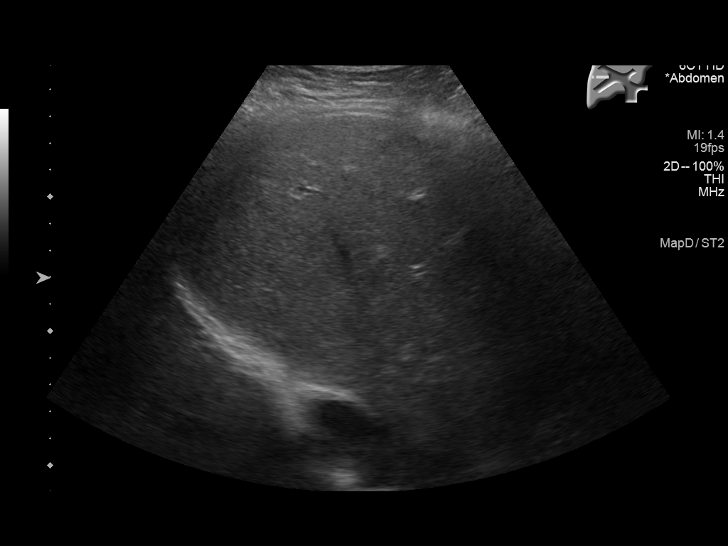
[im 48/53]
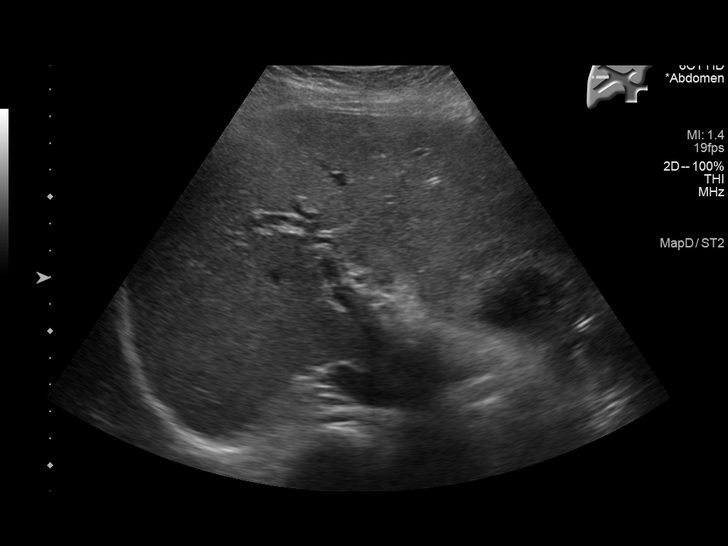
[im 53/53]
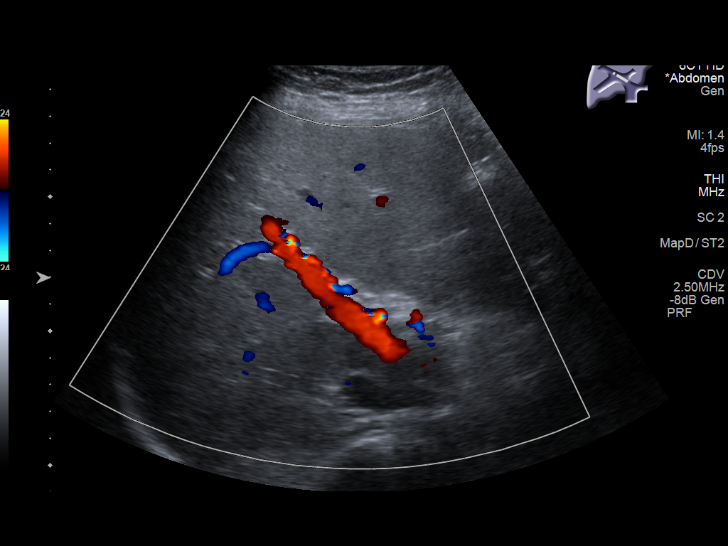

[13 of 25 positions shown; findings below may reference images not displayed]

FINDINGS: Gallbladder:

Significant sludge filling the gallbladder. Multiple non mobile
echogenic foci. One of these was seen previously measuring 6 mm in
December 2019 5 mm today. No wall thickening, Murphy's sign,
pericholecystic fluid, or shadowing stones.

Common bile duct:

Diameter: 2.9 mm

Liver:

No focal lesion identified. Within normal limits in parenchymal
echogenicity. Portal vein is patent on color Doppler imaging with
normal direction of blood flow towards the liver.

Other: None.
IMPRESSION: 1. Significant sludge is seen filling the gallbladder, a new finding
since Tuesday December, 2019. No wall thickening, Murphy's sign, shadowing
stones, or pericholecystic fluid.
2. Multiple non mobile echogenic nonshadowing foci are seen
throughout the gallbladder. The largest measures 5 mm today versus 6
mm previously. The others were not seen in 2429. Differential
considerations would include polyps versus tumefactive sludge. The
rapid development of several of these findings in less than 2 months
suggests tumefactive sludge. Recommend follow-up ultrasound in 6
months to ensure resolution.

## 2021-02-24 ENCOUNTER — Encounter: Payer: Self-pay | Admitting: Licensed Practical Nurse

## 2021-02-24 ENCOUNTER — Other Ambulatory Visit (HOSPITAL_COMMUNITY)
Admission: RE | Admit: 2021-02-24 | Discharge: 2021-02-24 | Disposition: A | Discharge status: Home / Self Care | Payer: Medicaid Other | Source: Ambulatory Visit | Attending: Licensed Practical Nurse | Admitting: Licensed Practical Nurse

## 2021-02-24 ENCOUNTER — Other Ambulatory Visit: Payer: Self-pay

## 2021-02-24 ENCOUNTER — Ambulatory Visit (INDEPENDENT_AMBULATORY_CARE_PROVIDER_SITE_OTHER): Payer: Medicaid Other | Admitting: Licensed Practical Nurse

## 2021-02-24 VITALS — Wt 171.6 lb

## 2021-02-24 DIAGNOSIS — Z113 Encounter for screening for infections with a predominantly sexual mode of transmission: Secondary | ICD-10-CM

## 2021-02-24 DIAGNOSIS — Z3490 Encounter for supervision of normal pregnancy, unspecified, unspecified trimester: Secondary | ICD-10-CM | POA: Diagnosis present

## 2021-02-24 DIAGNOSIS — Z3480 Encounter for supervision of other normal pregnancy, unspecified trimester: Secondary | ICD-10-CM | POA: Insufficient documentation

## 2021-02-24 DIAGNOSIS — Z124 Encounter for screening for malignant neoplasm of cervix: Secondary | ICD-10-CM

## 2021-02-24 DIAGNOSIS — Z8619 Personal history of other infectious and parasitic diseases: Secondary | ICD-10-CM

## 2021-02-24 DIAGNOSIS — Z3A09 9 weeks gestation of pregnancy: Secondary | ICD-10-CM

## 2021-02-24 NOTE — Progress Notes (Signed)
New Obstetric Patient H&P    Chief Complaint: "Desires prenatal care"   History of Present Illness: Patient is a 35 y.o. [id number] Not Hispanic or Latino female, presents with amenorrhea and positive home pregnancy test. Patient's last menstrual period was 12/20/2020 (exact date). and based on her  LMP, her EDD is Estimated Date of Delivery: 09/26/21 and her EGA is [redacted]w[redacted]d. Cycles are 7. days, regular, and occur approximately every :  monthly   days. Her last pap smear was 1 years ago and was  negative with POS HPV  .    She was not contracepting at the time of conception, is pleased to be pregnant again.  She had a urine pregnancy test which was positive 1 month(s)  ago.  Since her LMP she claims she has experienced nausea, fatigue, breast tenderness. She denies vaginal bleeding. Her past medical history is contibutory hx HSV . Her prior pregnancies are notable for preterm labor x 2, followed by 2 full term births.  Breastfed/pumped  x 3-4 months  Since her LMP, she admits to the use of tobacco products  no Has used MJ up until she found out she was pregnant There are cats in the home in the home  no  She admits close contact with children on a regular basis  yes  She has had chicken pox in the past yes She has had Tuberculosis exposures, symptoms, or previously tested positive for TB   no Current or past history of domestic violence. no  Lives with her mother, she does have  a partner but they live separately, he is the father of her 39 month old   Genetic Screening/Teratology Counseling: (Includes patient, baby's father, or anyone in either family with:)  Mica is [place], her family is from [place], The FOB is African American   1. Patient's age >/= 23 at [facility]  no 2. Thalassemia ([place], [place], Mediterranean, or Asian background): MCV<80  yes 3. Neural tube defect (meningomyelocele, spina bifida, anencephaly)  no 4. Congenital heart defect  no  5. Down syndrome  no 6. Tay-Sachs (Jewish,  [place]  no 7. Canavan's Disease  no 8. Sickle cell disease or trait (African)  no  9. Hemophilia or other blood disorders  no  10. Muscular dystrophy  no  11. Cystic fibrosis  no  12. Huntington's Chorea  no  13. Mental retardation/autism  no 14. Other inherited genetic or chromosomal disorder  no 15. Maternal metabolic disorder (DM, PKU, etc)  no 16. Patient or FOB with a child with a birth defect not listed above no  16a. Patient or FOB with a birth defect themselves no 17. Recurrent pregnancy loss, or stillbirth  no  18. Any medications since LMP other than prenatal vitamins (include vitamins, supplements, OTC meds, drugs, alcohol)  yes 19. Any other genetic/environmental exposure to discuss  yes, she is a Advertising account planner, she does wear a mask at work.   Infection History:   1. Lives with someone with TB or TB exposed  no  2. Patient or partner has history of genital herpes  yes 3. Rash or viral illness since LMP  no 4. History of STI (GC, CT, HPV, syphilis, HIV)  history of STI in prior pregnancies  5. History of recent travel :  no  Other pertinent information:  no     Review of Systems:10 point review of systems negative unless otherwise noted in HPI  Past Medical History:  Patient Active Problem List  Diagnosis Date Noted   Postpartum care following vaginal delivery 05/15/2020    Past Surgical History:  Past Surgical History:  Procedure Laterality Date   galbladder removed     SALPINGECTOMY Left 09/2020    Gynecologic History: Patient's last menstrual period was 12/20/2020 (exact date).  Obstetric History: [id number]  Family History:  Family History  Problem Relation Age of Onset   Healthy Mother    Healthy Father     Social History:  Social History   Socioeconomic History   Marital status: Significant Other    Spouse name: [place]   Number of children: 3   Years of education: Not on file   Highest education level: Not on file   Occupational History   Not on file  Tobacco Use   Smoking status: Former   Smokeless tobacco: Never   Tobacco comments:    quit 2015  Vaping Use   Vaping Use: Never used  Substance and Sexual Activity   Alcohol use: Not Currently    Comment: 2-3 per week when not pregnant   Drug use: Not Currently    Types: Marijuana    Comment: last use mj- one week ago   Sexual activity: Yes    Birth control/protection: None  Other Topics Concern   Not on file  Social History Narrative   Not on file   Social Determinants of Health   Financial Resource Strain: Not on file  Food Insecurity: Not on file  Transportation Needs: Not on file  Physical Activity: Not on file  Stress: Not on file  Social Connections: Not on file  Intimate Partner Violence: Not At Risk   Fear of Current or Ex-Partner: No   Emotionally Abused: No   Physically Abused: No   Sexually Abused: No    Allergies:  No Known Allergies  Medications: Prior to Admission medications   Medication Sig Start Date End Date Taking? Authorizing Provider  Prenatal Vit-Fe Fumarate-FA (MULTIVITAMIN-PRENATAL) 27-0.8 MG TABS tablet Take 1 tablet by mouth daily at 12 noon. 02/13/21 05/24/21 Yes Federico Flake, MD  ferrous sulfate (FERROUSUL) 325 (65 FE) MG tablet Take 1 tablet (325 mg total) by mouth 2 (two) times daily. Patient not taking: Reported on 02/13/2021 02/26/20   Zipporah Plants, CNM  metroNIDAZOLE (FLAGYL) 500 MG tablet Take 1 tablet (500 mg total) by mouth 2 (two) times daily. Patient not taking: Reported on 02/13/2021 10/07/20   Merrilee Jansky, MD  nitrofurantoin, macrocrystal-monohydrate, (MACROBID) 100 MG capsule Take 1 capsule (100 mg total) by mouth 2 (two) times daily. Patient not taking: Reported on 02/13/2021 10/05/20   Delton See, MD    Physical Exam Vitals: Weight 171 lb 9.6 oz (77.8 kg), last menstrual period 12/20/2020, not currently breastfeeding.  General: NAD HEENT: normocephalic,  anicteric Thyroid: no enlargement, no palpable nodules Pulmonary: No increased work of breathing, CTAB Cardiovascular: RRR, distal pulses 2+ Breasts: no masses or redness  Abdomen: NABS, soft, non-tender, non-distended.  Umbilicus without lesions.  No hepatomegaly, splenomegaly or masses palpable. No evidence of hernia  Genitourinary:  External: Normal external female genitalia.  Normal urethral meatus, normal  Bartholin's and Skene's glands.    Vagina: Normal vaginal mucosa, no evidence of prolapse.    Cervix: Grossly normal in appearance, no bleeding  Uterus: about 8-9 week sized   mobile, normal contour.  No CMT  Adnexa: ovaries non-enlarged, no adnexal masses  Rectal: deferred Extremities: no edema, erythema, or tenderness Neurologic: Grossly intact Psychiatric: mood appropriate, affect full  Assessment: 35 y.o. T2W5809 at [redacted]w[redacted]d presenting to initiate prenatal care  Plan: 1) Avoid alcoholic beverages. 2) Patient encouraged not to smoke.  3) Discontinue the use of all non-medicinal drugs and chemicals.  4) Take prenatal vitamins daily.  5) Nutrition, food safety (fish, cheese advisories, and high nitrite foods) and exercise discussed. 6) Hospital and practice style discussed with cross coverage system.  7) Genetic Screening, such as with 1st Trimester Screening, cell free fetal DNA, AFP testing, and Ultrasound, as well as with amniocentesis and CVS as appropriate, is discussed with patient. At the conclusion of today's visit patient requested genetic testing 8) Patient is asked about travel to areas at risk for the Zika virus, and counseled to avoid travel and exposure to mosquitoes or sexual partners who may have themselves been exposed to the virus. Testing is discussed, and will be ordered as appropriate.  9) Return in 1 week for prenatal labs and genetic testing  10) Order for dating [place] placed   Carie Caddy, [place] [facility], [facility] 02/24/2021, 4:49  PM

## 2021-02-25 LAB — DRUG SCREEN, URINE
Amphetamines, Urine: NEGATIVE ng/mL
Barbiturate screen, urine: NEGATIVE ng/mL
Benzodiazepine Quant, Ur: NEGATIVE ng/mL
Cannabinoid Quant, Ur: POSITIVE ng/mL — AB
Cocaine (Metab.): NEGATIVE ng/mL
Opiate Quant, Ur: NEGATIVE ng/mL
PCP Quant, Ur: NEGATIVE ng/mL

## 2021-02-26 LAB — URINE CULTURE

## 2021-03-01 NOTE — L&D Delivery Note (Signed)
Delivery Note   Ltanya Bayley is a 36 y.o. D1V6160 at [redacted]w[redacted]d Estimated Date of Delivery: 09/26/21  PRE-OPERATIVE DIAGNOSIS:  1) [redacted]w[redacted]d pregnancy.    POST-OPERATIVE DIAGNOSIS:  1) [redacted]w[redacted]d pregnancy s/p Vaginal, Spontaneous   Delivery Type: Vaginal, Spontaneous    Delivery Anesthesia: Epidural   Labor Complications:      ESTIMATED BLOOD LOSS: 75  ml    FINDINGS:   1) female infant, "[place]," Apgar scores of 8   at 1 minute and 9   at 5 minutes and a birthweight of 93.48  ounces.     SPECIMENS:   PLACENTA:   Appearance: Intact    Removal: Spontaneous      Disposition:  Per protocol  CORD BLOOD: Not Indicated  DISPOSITION:  Infant left in stable condition in the delivery room, with L&D personnel and mother,  NARRATIVE SUMMARY: Labor course:  Kura Bethards is a V3X1062 at [redacted]w[redacted]d who presented to Labor & Delivery for labor management. Her initial cervical exam was 6.5/80/-2. Labor proceeded with augmentation and she was found to be completely dilated at 1720. With excellent maternal pushing effort, she birthed a viable female infant at 70. The shoulders were birthed without difficulty. The infant was placed skin-to-skin with Saloni. The cord was doubly clamped and cut by Toniann Fail when pulsations ceased. The placenta delivered spontaneously and was noted to be intact with a 3VC. A perineal and vaginal examination was performed. Episiotomy/Lacerations: None   The patient tolerated this well. Mother and baby were left in stable condition.   Guadlupe Spanish, CNM 09/15/2021 6:10 PM

## 2021-03-03 ENCOUNTER — Other Ambulatory Visit: Payer: Medicaid Other

## 2021-03-03 ENCOUNTER — Other Ambulatory Visit: Payer: Self-pay | Admitting: Licensed Practical Nurse

## 2021-03-03 ENCOUNTER — Other Ambulatory Visit: Payer: Self-pay

## 2021-03-03 DIAGNOSIS — Z3480 Encounter for supervision of other normal pregnancy, unspecified trimester: Secondary | ICD-10-CM | POA: Diagnosis not present

## 2021-03-03 DIAGNOSIS — Z1379 Encounter for other screening for genetic and chromosomal anomalies: Secondary | ICD-10-CM | POA: Diagnosis not present

## 2021-03-03 DIAGNOSIS — Z131 Encounter for screening for diabetes mellitus: Secondary | ICD-10-CM | POA: Diagnosis not present

## 2021-03-04 ENCOUNTER — Other Ambulatory Visit: Payer: Self-pay | Admitting: Licensed Practical Nurse

## 2021-03-04 LAB — CYTOLOGY - PAP
Chlamydia: NEGATIVE
Comment: NEGATIVE
Comment: NEGATIVE
Comment: NEGATIVE
Comment: NEGATIVE
Comment: NORMAL
Diagnosis: NEGATIVE
HPV 16: NEGATIVE
HPV 18 / 45: NEGATIVE
High risk HPV: POSITIVE — AB
Neisseria Gonorrhea: NEGATIVE
Trichomonas: NEGATIVE

## 2021-03-04 LAB — HEMOGLOBIN A1C
Est. average glucose Bld gHb Est-mCnc: 91 mg/dL
Hgb A1c MFr Bld: 4.8 % (ref 4.8–5.6)

## 2021-03-04 LAB — CBC/D/PLT+RPR+RH+ABO+RUBIGG...
Antibody Screen: NEGATIVE
Basophils Absolute: 0 10*3/uL (ref 0.0–0.2)
Basos: 0 %
EOS (ABSOLUTE): 0.2 10*3/uL (ref 0.0–0.4)
Eos: 2 %
HCV Ab: <0.1 s/co ratio (ref 0.0–0.9)
HIV Screen 4th Generation wRfx: NONREACTIVE
Hematocrit: 34.1 % (ref 34.0–46.6)
Hemoglobin: 11 g/dL — ABNORMAL LOW (ref 11.1–15.9)
Hepatitis B Surface Ag: NEGATIVE
Immature Grans (Abs): 0.1 10*3/uL (ref 0.0–0.1)
Immature Granulocytes: 1 %
Lymphocytes Absolute: 2.3 10*3/uL (ref 0.7–3.1)
Lymphs: 26 %
MCH: 20.7 pg — ABNORMAL LOW (ref 26.6–33.0)
MCHC: 32.3 g/dL (ref 31.5–35.7)
MCV: 64 fL — ABNORMAL LOW (ref 79–97)
Monocytes Absolute: 0.5 10*3/uL (ref 0.1–0.9)
Monocytes: 6 %
Neutrophils Absolute: 5.8 10*3/uL (ref 1.4–7.0)
Neutrophils: 65 %
Platelets: 284 10*3/uL (ref 150–450)
RBC: 5.32 x10E6/uL — ABNORMAL HIGH (ref 3.77–5.28)
RDW: 18.6 % — ABNORMAL HIGH (ref 11.7–15.4)
RPR Ser Ql: NONREACTIVE
Rh Factor: POSITIVE
Rubella Antibodies, IGG: 2.28 index (ref >0.99)
Varicella zoster IgG: 440 index (ref >165)
WBC: 8.9 10*3/uL (ref 3.4–10.8)

## 2021-03-04 LAB — HCV INTERPRETATION

## 2021-03-04 NOTE — Progress Notes (Signed)
TC to pt.  Reviewed PaP results, NILM with HPV pos x 2, rec colopo.  May have done in pregnancy or wait until after PP.  Trenika desires to have colpo now.  Front Development worker, international aid to schedule colpo. Carie Caddy, CNM  [facility] Pulse, [facility]  03/04/21  3:30 PM

## 2021-03-05 ENCOUNTER — Telehealth: Payer: Self-pay

## 2021-03-05 NOTE — Telephone Encounter (Signed)
-----   Message from Ellwood Sayers, CNM sent at 03/04/2021  3:27 PM EST ----- Regarding: colpo Please schedule this pt a colpo at her convenience. Thanks,  Isabelle Course

## 2021-03-05 NOTE — Telephone Encounter (Signed)
Patient is scheduled for 04/10/21 with [facility]

## 2021-03-07 LAB — MATERNIT 21 PLUS CORE, BLOOD
Fetal Fraction: 7
Result (T21): NEGATIVE
Trisomy 13 (Patau syndrome): NEGATIVE
Trisomy 18 (Edwards syndrome): NEGATIVE
Trisomy 21 (Down syndrome): NEGATIVE

## 2021-03-13 ENCOUNTER — Ambulatory Visit
Admission: RE | Admit: 2021-03-13 | Discharge: 2021-03-13 | Disposition: A | Discharge status: Home / Self Care | Payer: Medicaid Other | Source: Ambulatory Visit | Attending: Licensed Practical Nurse | Admitting: Licensed Practical Nurse

## 2021-03-13 ENCOUNTER — Other Ambulatory Visit: Payer: Self-pay | Admitting: Licensed Practical Nurse

## 2021-03-13 DIAGNOSIS — Z3480 Encounter for supervision of other normal pregnancy, unspecified trimester: Secondary | ICD-10-CM

## 2021-03-13 DIAGNOSIS — Z3481 Encounter for supervision of other normal pregnancy, first trimester: Secondary | ICD-10-CM | POA: Insufficient documentation

## 2021-03-13 DIAGNOSIS — Z3A11 11 weeks gestation of pregnancy: Secondary | ICD-10-CM | POA: Diagnosis not present

## 2021-03-13 DIAGNOSIS — O26841 Uterine size-date discrepancy, first trimester: Secondary | ICD-10-CM | POA: Diagnosis not present

## 2021-03-24 ENCOUNTER — Ambulatory Visit (INDEPENDENT_AMBULATORY_CARE_PROVIDER_SITE_OTHER): Payer: Medicaid Other | Admitting: Advanced Practice Midwife

## 2021-03-24 ENCOUNTER — Other Ambulatory Visit: Payer: Self-pay

## 2021-03-24 ENCOUNTER — Encounter: Payer: Self-pay | Admitting: Advanced Practice Midwife

## 2021-03-24 VITALS — BP 100/70 | Wt 168.0 lb

## 2021-03-24 DIAGNOSIS — Z3A13 13 weeks gestation of pregnancy: Secondary | ICD-10-CM

## 2021-03-24 DIAGNOSIS — Z3480 Encounter for supervision of other normal pregnancy, unspecified trimester: Secondary | ICD-10-CM

## 2021-03-24 LAB — POCT URINALYSIS DIPSTICK OB
Glucose, UA: NEGATIVE
POC,PROTEIN,UA: NEGATIVE

## 2021-03-24 NOTE — Progress Notes (Signed)
Routine Prenatal Care Visit  Subjective  Breanna Webb is a 36 y.o. G5X6468 at [redacted]w[redacted]d being seen today for ongoing prenatal care.  She is currently monitored for the following issues for this low-risk pregnancy and has Supervision of other normal pregnancy, antepartum; History of herpes genitalis; and Anemia on their problem list.  ----------------------------------------------------------------------------------- Patient reports no complaints. NOB lab results reviewed including MaterniT 21 (she is unable to access her MyChart account)  . Vag. Bleeding: None.  Movement: Present. Leaking Fluid denies.  ----------------------------------------------------------------------------------- The following portions of the patient's history were reviewed and updated as appropriate: allergies, current medications, past family history, past medical history, past social history, past surgical history and problem list. Problem list updated.  Objective  Blood pressure 100/70, weight 168 lb (76.2 kg), last menstrual period 12/20/2020, not currently breastfeeding. Pregravid weight 160 lb (72.6 kg) Total Weight Gain 8 lb (3.629 kg) Urinalysis: Urine Protein    Urine Glucose    Fetal Status: Fetal Heart Rate (bpm): 165   Movement: Present     General:  Alert, oriented and cooperative. Patient is in no acute distress.  Skin: Skin is warm and dry. No rash noted.   Cardiovascular: Normal heart rate noted  Respiratory: Normal respiratory effort, no problems with respiration noted  Abdomen: Soft, gravid, appropriate for gestational age. Pain/Pressure: Absent     Pelvic:  Cervical exam deferred        Extremities: Normal range of motion.  Edema: None  Mental Status: Normal mood and affect. Normal behavior. Normal judgment and thought content.   Assessment   36 y.o. E3O1224 at [redacted]w[redacted]d by  09/26/2021, by Last Menstrual Period presenting for routine prenatal visit  Plan   Pregnancy 7 Problems (from 02/24/21 to  present)    Problem Noted Resolved   Supervision of other normal pregnancy, antepartum 02/24/2021 by Ellwood Sayers, CNM No   Overview Signed 02/24/2021  5:00 PM by Ellwood Sayers, CNM     Nursing Staff Provider  Office Location  [facility] Dating    Language  English  Anatomy US    Flu Vaccine   Genetic Screen  NIPS:   TDaP vaccine    Hgb A1C or  GTT Early : Third trimester :   Covid    LAB RESULTS   Rhogam   Blood Type --/--/B POS Performed at [facility], [address] [place], [place] [address]  [phone])   Feeding Plan  Antibody NEG (03/15 2339)  Contraception  Rubella    Circumcision  RPR NON REACTIVE (03/15 2339)   Pediatrician   HBsAg     Support Person  HIV    Prenatal Classes  Varicella     GBS Negative/-- (02/15 1502)(For PCN allergy, check sensitivities)   BTL Consent     VBAC Consent  Pap      Hgb Electro    Pelvis Tested  CF      SMA         Hx HSV prophylaxis at 36 wks []           Preterm labor symptoms and general obstetric precautions including but not limited to vaginal bleeding, contractions, leaking of fluid and fetal movement were reviewed in detail with the patient. Please refer to After Visit Summary for other counseling recommendations.   Return in about 4 weeks (around 04/21/2021) for rob.  04/23/2021, CNM 03/24/2021 10:34 AM

## 2021-03-24 NOTE — Patient Instructions (Signed)

## 2021-03-24 NOTE — Addendum Note (Signed)
Addended by: Cornelius Moras D on: 03/24/2021 10:38 AM   Modules accepted: Orders

## 2021-04-10 ENCOUNTER — Ambulatory Visit (INDEPENDENT_AMBULATORY_CARE_PROVIDER_SITE_OTHER): Payer: Medicaid Other | Admitting: Obstetrics & Gynecology

## 2021-04-10 ENCOUNTER — Ambulatory Visit: Payer: Medicaid Other | Admitting: Obstetrics & Gynecology

## 2021-04-10 ENCOUNTER — Other Ambulatory Visit: Payer: Self-pay

## 2021-04-10 ENCOUNTER — Encounter: Payer: Self-pay | Admitting: Obstetrics & Gynecology

## 2021-04-10 VITALS — BP 112/62 | Ht 60.0 in | Wt 170.0 lb

## 2021-04-10 DIAGNOSIS — R87618 Other abnormal cytological findings on specimens from cervix uteri: Secondary | ICD-10-CM

## 2021-04-10 NOTE — Progress Notes (Signed)
HPI:  Breanna Webb is a 36 y.o.  O3K0677  who presents today for evaluation and management of abnormal cervical cytology.    Dysplasia History:  HPV POS on recent PAP as well as 2021 PAP Pregnant [redacted] weeks  ROS:  Pertinent items are noted in HPI.  OB History  Gravida Para Term Preterm AB Living  7 4 4   2 4   SAB IAB Ectopic Multiple Live Births  1   1 0 4    # Outcome Date GA Lbr Len/2nd Weight Sex Delivery Anes PTL Lv  7 Current           6 Ectopic 09/2020          5 Term 05/14/20 [redacted]w[redacted]d 36:23 / 00:10 5 lb 14.5 oz (2.68 kg) F Vag-Spont EPI  LIV  4 Term 03/29/13    M Vag-Spont  N LIV  3 SAB 2015          2 Term 10/11/09    F Vag-Spont  N LIV  1 Term 08/11/06    F Vag-Spont  N LIV    Past Medical History:  Diagnosis Date   Anemia    Patient reports anemia with her pregnancies   Infection    UTI   Miscarriage    Vaginal Pap smear, abnormal     Past Surgical History:  Procedure Laterality Date   galbladder removed     SALPINGECTOMY Left 09/2020    SOCIAL HISTORY:  Social History   Substance and Sexual Activity  Alcohol Use Not Currently   Comment: 2-3 per week when not pregnant    Social History   Substance and Sexual Activity  Drug Use Not Currently   Types: Marijuana   Comment: last use mj- one week ago     Family History  Problem Relation Age of Onset   Healthy Mother    Healthy Father     ALLERGIES:  Patient has no known allergies.  Current Outpatient Medications on File Prior to Visit  Medication Sig Dispense Refill   Prenatal Vit-Fe Fumarate-FA (MULTIVITAMIN-PRENATAL) 27-0.8 MG TABS tablet Take 1 tablet by mouth daily at 12 noon. 100 tablet 0   No current facility-administered medications on file prior to visit.    Physical Exam: -Vitals:  BP 112/62    Ht 5' (1.524 m)    Wt 170 lb (77.1 kg)    LMP 12/20/2020 (Exact Date)    BMI 33.20 kg/m  GEN: WD, WN, NAD.  A+ O x 3, good mood and affect. ABD:  NT, ND.  Soft, no masses.  No hernias  noted.   Pelvic:   Vulva: Normal appearance.  No lesions.  Vagina: No lesions or abnormalities noted.  Support: Normal pelvic support.  Urethra No masses tenderness or scarring.  Meatus Normal size without lesions or prolapse.  Cervix: See below.  Anus: Normal exam.  No lesions.  Perineum: Normal exam.  No lesions.        Bimanual   Uterus: Normal size.  Non-tender.  Mobile.  AV.  Adnexae: No masses.  Non-tender to palpation.  Cul-de-sac: Negative for abnormality.   PROCEDURE: 1.  Urine Pregnancy Test:  positive 2.  Colposcopy performed with 4% acetic acid after verbal consent obtained                                         -Aceto-white Lesions  Location(s): none.              -Biopsy not performed               -ECC indicated and performed: No.      -Satisfactory colposcopy: Yes.      -Evidence of Invasive cervical CA :  NO  ASSESSMENT:  Breanna Webb is a 36 y.o. O8C1660 here for  1. Pap smear abnormality of cervix/human papillomavirus (HPV) positive   .  PLAN: 1.  I discussed the grading system of pap smears and HPV high risk viral types.   2. Follow up PAP POST PARTUM     Annamarie Major, MD, [facility], [facility] 04/10/2021  2:08 PM

## 2021-04-21 ENCOUNTER — Ambulatory Visit (INDEPENDENT_AMBULATORY_CARE_PROVIDER_SITE_OTHER): Payer: Medicaid Other | Admitting: Obstetrics

## 2021-04-21 ENCOUNTER — Other Ambulatory Visit: Payer: Self-pay

## 2021-04-21 VITALS — BP 118/74 | Wt 168.0 lb

## 2021-04-21 DIAGNOSIS — Z3A17 17 weeks gestation of pregnancy: Secondary | ICD-10-CM

## 2021-04-21 DIAGNOSIS — Z3480 Encounter for supervision of other normal pregnancy, unspecified trimester: Secondary | ICD-10-CM

## 2021-04-21 NOTE — Progress Notes (Signed)
No vb. No lof.  

## 2021-04-21 NOTE — Progress Notes (Signed)
Routine Prenatal Care Visit  Subjective  Breanna Webb is a 36 y.o. R4B6384 at [redacted]w[redacted]d being seen today for ongoing prenatal care.  She is currently monitored for the following issues for this low-risk pregnancy and has Supervision of other normal pregnancy, antepartum; History of herpes genitalis; and Anemia on their problem list.  ----------------------------------------------------------------------------------- Patient reports no complaints.  She is feeling flutters. Glad she is having a boy. May want more children.  . Vag. Bleeding: None.  Movement: Present. Leaking Fluid denies.  ----------------------------------------------------------------------------------- The following portions of the patient's history were reviewed and updated as appropriate: allergies, current medications, past family history, past medical history, past social history, past surgical history and problem list. Problem list updated.  Objective  Blood pressure 118/74, weight 168 lb (76.2 kg), last menstrual period 12/20/2020, not currently breastfeeding. Pregravid weight 160 lb (72.6 kg) Total Weight Gain 8 lb (3.629 kg) Urinalysis: Urine Protein    Urine Glucose    Fetal Status:     Movement: Present     General:  Alert, oriented and cooperative. Patient is in no acute distress.  Skin: Skin is warm and dry. No rash noted.   Cardiovascular: Normal heart rate noted  Respiratory: Normal respiratory effort, no problems with respiration noted  Abdomen: Soft, gravid, appropriate for gestational age. Pain/Pressure: Absent     Pelvic:  Cervical exam deferred        Extremities: Normal range of motion.     Mental Status: Normal mood and affect. Normal behavior. Normal judgment and thought content.   Assessment   36 y.o. T3M4680 at [redacted]w[redacted]d by  09/26/2021, by Last Menstrual Period presenting for routine prenatal visit  Plan   Pregnancy 7 Problems (from 02/24/21 to present)    Problem Noted Resolved   Supervision of  other normal pregnancy, antepartum 02/24/2021 by Ellwood Sayers, CNM No   Overview Signed 02/24/2021  5:00 PM by Ellwood Sayers, CNM     Nursing Staff Provider  Office Location  [facility] Dating    Language  English  Anatomy US    Flu Vaccine   Genetic Screen  NIPS:   TDaP vaccine    Hgb A1C or  GTT Early : Third trimester :   Covid    LAB RESULTS   Rhogam   Blood Type --/--/B POS Performed at [facility], [address] [place], [place] [address]  [phone])   Feeding Plan  Antibody NEG (03/15 2339)  Contraception  Rubella    Circumcision  RPR NON REACTIVE (03/15 2339)   Pediatrician   HBsAg     Support Person  HIV    Prenatal Classes  Varicella     GBS Negative/-- (02/15 1502)(For PCN allergy, check sensitivities)   BTL Consent     VBAC Consent  Pap      Hgb Electro    Pelvis Tested  CF      SMA         Hx HSV prophylaxis at 36 wks []           Preterm labor symptoms and general obstetric precautions including but not limited to vaginal bleeding, contractions, leaking of fluid and fetal movement were reviewed in detail with the patient. Please refer to After Visit Summary for other counseling recommendations.   Return in about 4 weeks (around 05/19/2021) for return OB. Anatomy scan ordered.  05/21/2021, CNM  04/21/2021 2:47 PM

## 2021-05-04 ENCOUNTER — Other Ambulatory Visit: Payer: Self-pay

## 2021-05-04 ENCOUNTER — Ambulatory Visit
Admission: RE | Admit: 2021-05-04 | Discharge: 2021-05-04 | Disposition: A | Discharge status: Home / Self Care | Payer: Medicaid Other | Source: Ambulatory Visit | Attending: Obstetrics | Admitting: Obstetrics

## 2021-05-04 DIAGNOSIS — Z3480 Encounter for supervision of other normal pregnancy, unspecified trimester: Secondary | ICD-10-CM

## 2021-05-04 DIAGNOSIS — Z3A19 19 weeks gestation of pregnancy: Secondary | ICD-10-CM | POA: Insufficient documentation

## 2021-05-04 DIAGNOSIS — O321XX0 Maternal care for breech presentation, not applicable or unspecified: Secondary | ICD-10-CM | POA: Insufficient documentation

## 2021-05-06 ENCOUNTER — Other Ambulatory Visit: Payer: Self-pay | Admitting: Obstetrics

## 2021-05-06 DIAGNOSIS — Z3480 Encounter for supervision of other normal pregnancy, unspecified trimester: Secondary | ICD-10-CM

## 2021-05-19 ENCOUNTER — Encounter: Payer: Medicaid Other | Admitting: Obstetrics

## 2021-05-20 ENCOUNTER — Ambulatory Visit
Admission: RE | Admit: 2021-05-20 | Discharge: 2021-05-20 | Disposition: A | Discharge status: Home / Self Care | Payer: Medicaid Other | Source: Ambulatory Visit | Attending: Obstetrics | Admitting: Obstetrics

## 2021-05-20 ENCOUNTER — Other Ambulatory Visit: Payer: Self-pay

## 2021-05-20 DIAGNOSIS — Z3689 Encounter for other specified antenatal screening: Secondary | ICD-10-CM | POA: Diagnosis present

## 2021-05-20 DIAGNOSIS — Z3A22 22 weeks gestation of pregnancy: Secondary | ICD-10-CM | POA: Insufficient documentation

## 2021-05-20 DIAGNOSIS — Z3492 Encounter for supervision of normal pregnancy, unspecified, second trimester: Secondary | ICD-10-CM | POA: Diagnosis not present

## 2021-05-20 DIAGNOSIS — Z3480 Encounter for supervision of other normal pregnancy, unspecified trimester: Secondary | ICD-10-CM | POA: Insufficient documentation

## 2021-05-21 ENCOUNTER — Ambulatory Visit (INDEPENDENT_AMBULATORY_CARE_PROVIDER_SITE_OTHER): Payer: Medicaid Other | Admitting: Obstetrics and Gynecology

## 2021-05-21 ENCOUNTER — Encounter: Payer: Self-pay | Admitting: Obstetrics and Gynecology

## 2021-05-21 DIAGNOSIS — Z3480 Encounter for supervision of other normal pregnancy, unspecified trimester: Secondary | ICD-10-CM

## 2021-05-21 LAB — POCT URINALYSIS DIPSTICK OB
Glucose, UA: NEGATIVE
POC,PROTEIN,UA: NEGATIVE

## 2021-05-21 NOTE — Progress Notes (Signed)
? ? ?  Routine Prenatal Care Visit ? ?Subjective  ?Breanna Webb is a 36 y.o. V4U9811 at [redacted]w[redacted]d being seen today for ongoing prenatal care.  She is currently monitored for the following issues for this low-risk pregnancy and has Supervision of other normal pregnancy, antepartum; History of herpes genitalis; and Anemia on their problem list.  ?----------------------------------------------------------------------------------- ?Patient reports no complaints.   ?Contractions: Not present. Vag. Bleeding: None.  Movement: Present. Denies leaking of fluid.  ?----------------------------------------------------------------------------------- ?The following portions of the patient's history were reviewed and updated as appropriate: allergies, current medications, past family history, past medical history, past social history, past surgical history and problem list. Problem list updated. ? ? ?Objective  ?Blood pressure 100/64, weight 172 lb (78 kg), last menstrual period 12/20/2020, not currently breastfeeding. ?Pregravid weight 160 lb (72.6 kg) Total Weight Gain 12 lb (5.443 kg) ?Urinalysis:     ? ?Fetal Status: Fetal Heart Rate (bpm): 150   Movement: Present    ? ?General:  Alert, oriented and cooperative. Patient is in no acute distress.  ?Skin: Skin is warm and dry. No rash noted.   ?Cardiovascular: Normal heart rate noted  ?Respiratory: Normal respiratory effort, no problems with respiration noted  ?Abdomen: Soft, gravid, appropriate for gestational age. Pain/Pressure: Absent     ?Pelvic:  Cervical exam deferred        ?Extremities: Normal range of motion.  Edema: None  ?Mental Status: Normal mood and affect. Normal behavior. Normal judgment and thought content.  ? ? ? ?Assessment  ? ?36 y.o. B1Y7829 at [redacted]w[redacted]d by  09/26/2021, by Last Menstrual Period presenting for routine prenatal visit ? ?Plan  ? ?Pregnancy 7 Problems (from 02/24/21 to present)   ? ? Problem Noted Resolved  ? Supervision of other normal pregnancy,  antepartum 02/24/2021 by Ellwood Sayers, CNM No  ? Overview Addendum 05/21/2021  1:41 PM by Natale Milch, MD  ?   ?Nursing Staff Provider  ?Office Location  [facility] Dating  LMP = 11 ek [place]  ?Language  English  Anatomy US  incomplete  ?Flu Vaccine  declined Genetic Screen  NIPS: normal XY  ?TDaP vaccine    Hgb A1C or  ?GTT Early : ?Third trimester :   ?Covid unvaccinated   LAB RESULTS   ?Rhogam  Not needed Blood Type B/Positive/-- (01/03 5621)   ?Feeding Plan Breast/pumping Antibody Negative (01/03 0956)  ?Contraception Considering IUD Rubella 2.28 (01/03 3086)  ?Circumcision  RPR Non Reactive (01/03 0956)   ?Pediatrician   HBsAg Negative (01/03 0956)   ?Support Person  FOB: [place] HIV Non Reactive (01/03 0956)  ?Prenatal Classes  discussed Varicella Immune  ?  GBS  (For PCN allergy, check sensitivities)   ?BTL Consent n/a    ?VBAC Consent  Pap  2023 NIL HPV+  ?  Hgb Electro    ?Pelvis Tested  CF   ?   SMA   ?     ?Hx HSV prophylaxis at 36 wks []  ?  ?  ? ?  ?  ?Discussed prenatal classed ?Discussed contraceptive choices and provided her with a handout ? ?Gestational age appropriate obstetric precautions including but not limited to vaginal bleeding, contractions, leaking of fluid and fetal movement were reviewed in detail with the patient.   ? ?Return in about 4 weeks (around 06/18/2021) for ROB in person. ? ?06/20/2021 MD ?[facility], [facility] ?05/21/2021, 1:41 PM ? ? ?

## 2021-05-21 NOTE — Patient Instructions (Addendum)
?Perinatal Education at [phone] ? ?Second Trimester of Pregnancy ?The second trimester of pregnancy is from week 13 through week 27. This is months 4 through 6 of pregnancy. The second trimester is often a time when you feel your best. Your body has adjusted to being pregnant, and you begin to feel better physically. ?During the second trimester: ?Morning sickness has lessened or stopped completely. ?You may have more energy. ?You may have an increase in appetite. ?The second trimester is also a time when the unborn baby (fetus) is growing rapidly. At the end of the sixth month, the fetus may be up to 12 inches long and weigh about 1? pounds. You will likely begin to feel the baby move (quickening) between 16 and 20 weeks of pregnancy. ?Body changes during your second trimester ?Your body continues to go through many changes during your second trimester. The changes vary and generally return to normal after the baby is born. ?Physical changes ?Your weight will continue to increase. You will notice your lower abdomen bulging out. ?You may begin to get stretch marks on your hips, abdomen, and breasts. ?Your breasts will continue to grow and to become tender. ?Dark spots or blotches (chloasma or mask of pregnancy) may develop on your face. ?A dark line from your belly button to the pubic area (linea nigra) may appear. ?You may have changes in your hair. These can include thickening of your hair, rapid growth, and changes in texture. Some people also have hair loss during or after pregnancy, or hair that feels dry or thin. ?Health changes ?You may develop headaches. ?You may have heartburn. ?You may develop constipation. ?You may develop hemorrhoids or swollen, bulging veins (varicose veins). ?Your gums may bleed and may be sensitive to brushing and flossing. ?You may urinate more often because the fetus is pressing on your bladder. ?You may have back pain. This is caused by: ?Weight gain. ?Pregnancy hormones that  are relaxing the joints in your pelvis. ?A shift in weight and the muscles that support your balance. ?Follow these instructions at home: ?Medicines ?Follow your health care provider's instructions regarding medicine use. Specific medicines may be either safe or unsafe to take during pregnancy. Do not take any medicines unless approved by your health care provider. ?Take a prenatal vitamin that contains at least 600 micrograms (mcg) of folic acid. ?Eating and drinking ?Eat a healthy diet that includes fresh fruits and vegetables, whole grains, good sources of protein such as meat, eggs, or tofu, and low-fat dairy products. ?Avoid raw meat and unpasteurized juice, milk, and cheese. These carry germs that can harm you and your baby. ?You may need to take these actions to prevent or treat constipation: ?Drink enough fluid to keep your urine pale yellow. ?Eat foods that are high in fiber, such as beans, whole grains, and fresh fruits and vegetables. ?Limit foods that are high in fat and processed sugars, such as fried or sweet foods. ?Activity ?Exercise only as directed by your health care provider. Most people can continue their usual exercise routine during pregnancy. Try to exercise for 30 minutes at least 5 days a week. Stop exercising if you develop contractions in your uterus. ?Stop exercising if you develop pain or cramping in the lower abdomen or lower back. ?Avoid exercising if it is very hot or humid or if you are at a high altitude. ?Avoid heavy lifting. ?If you choose to, you may have sex unless your health care provider tells you not to. ?Relieving pain and  discomfort ?Wear a supportive bra to prevent discomfort from breast tenderness. ?Take warm sitz baths to soothe any pain or discomfort caused by hemorrhoids. Use hemorrhoid cream if your health care provider approves. ?Rest with your legs raised (elevated) if you have leg cramps or low back pain. ?If you develop varicose veins: ?Wear support hose as told  by your health care provider. ?Elevate your feet for 15 minutes, 3-4 times a day. ?Limit salt in your diet. ?Safety ?Wear your seat belt at all times when driving or riding in a car. ?Talk with your health care provider if someone is verbally or physically abusive to you. ?Lifestyle ?Do not use hot tubs, steam rooms, or saunas. ?Do not douche. Do not use tampons or scented sanitary pads. ?Avoid cat litter boxes and soil used by cats. These carry germs that can cause birth defects in the baby and possibly loss of the fetus by miscarriage or stillbirth. ?Do not use herbal remedies, alcohol, illegal drugs, or medicines that are not approved by your health care provider. Chemicals in these products can harm your baby. ?Do not use any products that contain nicotine or tobacco, such as cigarettes, e-cigarettes, and chewing tobacco. If you need help quitting, ask your health care provider. ?General instructions ?During a routine prenatal visit, your health care provider will do a physical exam and other tests. He or she will also discuss your overall health. Keep all follow-up visits. This is important. ?Ask your health care provider for a referral to a local prenatal education class. ?Ask for help if you have counseling or nutritional needs during pregnancy. Your health care provider can offer advice or refer you to specialists for help with various needs. ?Where to find more information ?American Pregnancy Association: americanpregnancy.org ?[employer]: PoolDevices.com.pt ?Office on Women's Health: KeywordPortfolios.com.br ?Contact a health care provider if you have: ?A headache that does not go away when you take medicine. ?Vision changes or you see spots in front of your eyes. ?Mild pelvic cramps, pelvic pressure, or nagging pain in the abdominal area. ?Persistent nausea, vomiting, or diarrhea. ?A bad-smelling vaginal discharge or foul-smelling urine. ?Pain  when you urinate. ?Sudden or extreme swelling of your face, hands, ankles, feet, or legs. ?A fever. ?Get help right away if you: ?Have fluid leaking from your vagina. ?Have spotting or bleeding from your vagina. ?Have severe abdominal cramping or pain. ?Have difficulty breathing. ?Have chest pain. ?Have fainting spells. ?Have not felt your baby move for the time period told by your health care provider. ?Have new or increased pain, swelling, or redness in an arm or leg. ?Summary ?The second trimester of pregnancy is from week 13 through week 27 (months 4 through 6). ?Do not use herbal remedies, alcohol, illegal drugs, or medicines that are not approved by your health care provider. Chemicals in these products can harm your baby. ?Exercise only as directed by your health care provider. Most people can continue their usual exercise routine during pregnancy. ?Keep all follow-up visits. This is important. ?This information is not intended to replace advice given to you by your health care provider. Make sure you discuss any questions you have with your health care provider. ?Document Revised: 07/25/2019 Document Reviewed: 05/31/2019 ?Elsevier Patient Education ? [place]. ? ?

## 2021-05-21 NOTE — Addendum Note (Signed)
Addended by: Donnetta Hail on: 05/21/2021 02:02 PM ? ? Modules accepted: Orders ? ?

## 2021-06-18 ENCOUNTER — Ambulatory Visit (INDEPENDENT_AMBULATORY_CARE_PROVIDER_SITE_OTHER): Payer: Medicaid Other | Admitting: Licensed Practical Nurse

## 2021-06-18 ENCOUNTER — Encounter: Payer: Self-pay | Admitting: Licensed Practical Nurse

## 2021-06-18 VITALS — BP 100/82 | Wt 169.0 lb

## 2021-06-18 DIAGNOSIS — Z3480 Encounter for supervision of other normal pregnancy, unspecified trimester: Secondary | ICD-10-CM

## 2021-06-18 DIAGNOSIS — Z3A25 25 weeks gestation of pregnancy: Secondary | ICD-10-CM

## 2021-06-18 LAB — POCT URINALYSIS DIPSTICK OB
Glucose, UA: NEGATIVE
POC,PROTEIN,UA: NEGATIVE

## 2021-06-18 NOTE — Progress Notes (Signed)
Routine Prenatal Care Visit ? ?Subjective  ?Breanna Webb is a 36 y.o. [id number] at [redacted]w[redacted]d being seen today for ongoing prenatal care.  She is currently monitored for the following issues for this low-risk pregnancy and has Supervision of other normal pregnancy, antepartum; History of herpes genitalis; and Anemia on their problem list.  ?----------------------------------------------------------------------------------- ?Patient reports no complaints.  Dealing with allergies, manages with Benadryl.  ?Contractions: Not present. Vag. Bleeding: None.  Movement: Present. Leaking Fluid denies.  ?----------------------------------------------------------------------------------- ?The following portions of the patient's history were reviewed and updated as appropriate: allergies, current medications, past family history, past medical history, past social history, past surgical history and problem list. Problem list updated. ? ?Objective  ?Blood pressure 100/82, weight 169 lb (76.7 kg), last menstrual period 12/20/2020, not currently breastfeeding. ?Pregravid weight 160 lb (72.6 kg) Total Weight Gain 9 lb (4.082 kg) ?Urinalysis: Urine Protein Negative  Urine Glucose Negative ? ?Fetal Status: Fetal Heart Rate (bpm): 140 Fundal Height: 26 cm Movement: Present    ? ?General:  Alert, oriented and cooperative. Patient is in no acute distress.  ?Skin: Skin is warm and dry. No rash noted.   ?Cardiovascular: Normal heart rate noted  ?Respiratory: Normal respiratory effort, no problems with respiration noted  ?Abdomen: Soft, gravid, appropriate for gestational age. Pain/Pressure: Absent     ?Pelvic:  Cervical exam deferred        ?Extremities: Normal range of motion.  Edema: None  ?Mental Status: Normal mood and affect. Normal behavior. Normal judgment and thought content.  ? ?Assessment  ? ?36 y.o. [id number] at [redacted]w[redacted]d by  09/26/2021, by Last Menstrual Period presenting for routine prenatal visit ? ?Plan  ? ?Pregnancy 7 Problems (from  02/24/21 to present)   ? ? Problem Noted Resolved  ? Supervision of other normal pregnancy, antepartum 02/24/2021 by Ellwood Sayers, CNM No  ? Overview Addendum 05/21/2021  1:42 PM by Natale Milch, MD  ?   ?Nursing Staff Provider  ?Office Location  [facility] Dating  LMP = 11 ek [place]  ?Language  English  Anatomy US  incomplete  ?Flu Vaccine  declined Genetic Screen  NIPS: normal XY  ?TDaP vaccine    Hgb A1C or  ?GTT Early : hgba1c 4.8 ?Third trimester :   ?Covid unvaccinated   LAB RESULTS   ?Rhogam  Not needed Blood Type B/Positive/-- (01/03 1950)   ?Feeding Plan Breast/pumping Antibody Negative (01/03 0956)  ?Contraception Considering IUD Rubella 2.28 (01/03 9326)  ?Circumcision  RPR Non Reactive (01/03 0956)   ?Pediatrician   HBsAg Negative (01/03 0956)   ?Support Person  FOB: [place] HIV Non Reactive (01/03 0956)  ?Prenatal Classes  discussed Varicella Immune  ?  GBS  (For PCN allergy, check sensitivities)   ?BTL Consent n/a    ?VBAC Consent  Pap  2023 NIL HPV+  ?  Hgb Electro    ?Pelvis Tested  CF   ?   SMA   ?     ? ?Hx HSV prophylaxis at 36 wks []  ?  ?  ? ?  ?  ? ?Preterm labor symptoms and general obstetric precautions including but not limited to vaginal bleeding, contractions, leaking of fluid and fetal movement were reviewed in detail with the patient. ?Please refer to After Visit Summary for other counseling recommendations.  ? ?Return in about 3 weeks (around 07/09/2021) for ROB, 28 wk labs . ? ?09/08/2021, CNM  ?Carie Caddy, [facility]  ?06/18/21  ?1:46 PM  ? ? ?

## 2021-07-08 ENCOUNTER — Encounter: Payer: Self-pay | Admitting: Obstetrics and Gynecology

## 2021-07-08 ENCOUNTER — Other Ambulatory Visit: Payer: Medicaid Other

## 2021-07-08 ENCOUNTER — Ambulatory Visit (INDEPENDENT_AMBULATORY_CARE_PROVIDER_SITE_OTHER): Payer: Medicaid Other | Admitting: Obstetrics and Gynecology

## 2021-07-08 VITALS — BP 120/80 | Wt 173.0 lb

## 2021-07-08 DIAGNOSIS — Z3480 Encounter for supervision of other normal pregnancy, unspecified trimester: Secondary | ICD-10-CM

## 2021-07-08 DIAGNOSIS — Z8619 Personal history of other infectious and parasitic diseases: Secondary | ICD-10-CM

## 2021-07-08 NOTE — Progress Notes (Signed)
? ?  PRENATAL VISIT NOTE ? ?Subjective:  ?Breanna Webb is a 36 y.o. [id number] at [redacted]w[redacted]d being seen today for ongoing prenatal care.  She is currently monitored for the following issues for this low-risk pregnancy and has Supervision of other normal pregnancy, antepartum; History of herpes genitalis; and Anemia on their problem list. ? ?Patient reports no complaints.  Contractions: Not present. Vag. Bleeding: None.  Movement: Present. Denies leaking of fluid.  ? ?The following portions of the patient's history were reviewed and updated as appropriate: allergies, current medications, past family history, past medical history, past social history, past surgical history and problem list.  ? ?Objective:  ? ?Vitals:  ? 07/08/21 1427  ?BP: 120/80  ?Weight: 173 lb (78.5 kg)  ? ? ?Fetal Status: Fetal Heart Rate (bpm): 140 Fundal Height: 29 cm Movement: Present    ? ?General:  Alert, oriented and cooperative. Patient is in no acute distress.  ?Skin: Skin is warm and dry. No rash noted.   ?Cardiovascular: Normal heart rate noted  ?Respiratory: Normal respiratory effort, no problems with respiration noted  ?Abdomen: Soft, gravid, appropriate for gestational age.  Pain/Pressure: Absent     ?Pelvic: Cervical exam deferred        ?Extremities: Normal range of motion.     ?Mental Status: Normal mood and affect. Normal behavior. Normal judgment and thought content.  ? ?Assessment and Plan:  ?Pregnancy: [id number] at [redacted]w[redacted]d ?1. Supervision of other normal pregnancy, antepartum ?Patient is doing well without complaints ?Third trimester labs and glucola today ?Patient is considering IUD for contraception ?- CBC ?- HIV Antibody (routine testing w rflx) ?- RPR ?- Glucose, 1 hour ? ?2. History of herpes genitalis ?Prophylaxis at 36 weeks ? ?Preterm labor symptoms and general obstetric precautions including but not limited to vaginal bleeding, contractions, leaking of fluid and fetal movement were reviewed in detail with the patient. ?Please  refer to After Visit Summary for other counseling recommendations.  ? ?Return in about 2 weeks (around 07/22/2021) for in person, ROB, Low risk. ? ?No future appointments. ? ?Catalina Antigua, MD ? ?

## 2021-07-09 ENCOUNTER — Encounter: Payer: Medicaid Other | Admitting: Licensed Practical Nurse

## 2021-07-09 ENCOUNTER — Other Ambulatory Visit: Payer: Medicaid Other

## 2021-07-09 ENCOUNTER — Other Ambulatory Visit: Payer: Self-pay | Admitting: Obstetrics and Gynecology

## 2021-07-09 LAB — CBC
Hematocrit: 29.5 % — ABNORMAL LOW (ref 34.0–46.6)
Hemoglobin: 10 g/dL — ABNORMAL LOW (ref 11.1–15.9)
MCH: 21.5 pg — ABNORMAL LOW (ref 26.6–33.0)
MCHC: 33.9 g/dL (ref 31.5–35.7)
MCV: 63 fL — ABNORMAL LOW (ref 79–97)
Platelets: 318 10*3/uL (ref 150–450)
RBC: 4.65 x10E6/uL (ref 3.77–5.28)
RDW: 16.8 % — ABNORMAL HIGH (ref 11.7–15.4)
WBC: 8.6 10*3/uL (ref 3.4–10.8)

## 2021-07-09 LAB — RPR: RPR Ser Ql: NONREACTIVE

## 2021-07-09 LAB — HIV ANTIBODY (ROUTINE TESTING W REFLEX): HIV Screen 4th Generation wRfx: NONREACTIVE

## 2021-07-09 LAB — GLUCOSE, 1 HOUR GESTATIONAL: Gestational Diabetes Screen: 112 mg/dL (ref 70–139)

## 2021-07-09 MED ORDER — FERROUS SULFATE 325 (65 FE) MG PO TABS: 325.0000 mg | ORAL_TABLET | ORAL | 1 refills | Status: AC

## 2021-07-20 ENCOUNTER — Ambulatory Visit (INDEPENDENT_AMBULATORY_CARE_PROVIDER_SITE_OTHER): Payer: Medicaid Other | Admitting: Obstetrics

## 2021-07-20 VITALS — BP 122/86 | Wt 172.2 lb

## 2021-07-20 DIAGNOSIS — Z3A30 30 weeks gestation of pregnancy: Secondary | ICD-10-CM

## 2021-07-20 DIAGNOSIS — Z23 Encounter for immunization: Secondary | ICD-10-CM | POA: Diagnosis not present

## 2021-07-20 DIAGNOSIS — O0910 Supervision of pregnancy with history of ectopic or molar pregnancy, unspecified trimester: Secondary | ICD-10-CM | POA: Insufficient documentation

## 2021-07-20 DIAGNOSIS — Z3483 Encounter for supervision of other normal pregnancy, third trimester: Secondary | ICD-10-CM

## 2021-07-20 DIAGNOSIS — O99019 Anemia complicating pregnancy, unspecified trimester: Secondary | ICD-10-CM

## 2021-07-20 LAB — POCT URINALYSIS DIPSTICK OB
Bilirubin, UA: NEGATIVE
Blood, UA: NEGATIVE
Glucose, UA: NEGATIVE
Ketones, UA: NEGATIVE
Leukocytes, UA: NEGATIVE
Nitrite, UA: NEGATIVE
POC,PROTEIN,UA: NEGATIVE
Spec Grav, UA: 1.015 (ref 1.010–1.025)
Urobilinogen, UA: 0.2 E.U./dL
pH, UA: 7 (ref 5.0–8.0)

## 2021-07-20 NOTE — Patient Instructions (Signed)
Please call if you have elevated blood pressure, vision changes, headache, right-sided upper abdominal pain.  Please call if you have contractions occurring every 5-10 minutes for 1-2 hours, if you have vaginal bleeding, if watery fluid is leaking from your vagina, or if you have decreased fetal movement.  If you are not yet signed up on [facility] MyChart, please ask us how to sign up for it!  

## 2021-07-20 NOTE — Progress Notes (Signed)
Pt presents for ROB visit. Pt states she is doing well with no verbal concerns for today's visit.

## 2021-07-20 NOTE — Progress Notes (Signed)
t/''t'tt'ttttt

## 2021-07-20 NOTE — Progress Notes (Signed)
Subjective: Patient returns for pregnancy follow-up. She has no concerns.  She reports good fetal movement, denies vaginal bleeding or leakage of fluid. She denies contractions. She denies urinary symptoms. Denies headaches, blurred vision or epigastric pain.   Objective: BP 122/86   Wt 172 lb 3.2 oz (78.1 kg)   LMP 12/20/2020 (Exact Date)   BMI 33.63 kg/m   Assessment and Plan:  Patient Breanna Webb is an 36 y.o. year old [id number] at [redacted]w[redacted]d with [facility] of Estimated Date of Delivery: 09/26/21 with Encounter for supervision of other normal pregnancy, third trimester - Plan: POC Urinalysis Dipstick OB  [redacted] weeks gestation of pregnancy - Plan: POC Urinalysis Dipstick OB  Maternal anemia in pregnancy, antepartum  Pregnancy with history of ectopic pregnancy, antepartum Labs: B pos RI VI 1 hr 112 Maternal anemia - taking iron sulfate plan hgb next visit  Genetics: CFDNA low risk msafp missed Immunizations: desires tdap today; will confirm flu and covid vaccine status next visit Its a boy  BMI 31 - TWG not currently on daily ASA Pap nil HPV pos - s/p colpo nl plan pp pap  S/p lsc left salpingectomy for ruptured ectopic pregnancy 8/22  Return in about 2 weeks (around 08/03/2021) for routine OB.

## 2021-07-22 ENCOUNTER — Encounter: Payer: Medicaid Other | Admitting: Obstetrics and Gynecology

## 2021-08-03 ENCOUNTER — Encounter: Payer: Medicaid Other | Admitting: Advanced Practice Midwife

## 2021-08-03 ENCOUNTER — Ambulatory Visit (INDEPENDENT_AMBULATORY_CARE_PROVIDER_SITE_OTHER): Payer: Medicaid Other | Admitting: Licensed Practical Nurse

## 2021-08-03 VITALS — BP 122/60 | Wt 171.0 lb

## 2021-08-03 DIAGNOSIS — Z3480 Encounter for supervision of other normal pregnancy, unspecified trimester: Secondary | ICD-10-CM

## 2021-08-03 DIAGNOSIS — Z3A32 32 weeks gestation of pregnancy: Secondary | ICD-10-CM | POA: Diagnosis not present

## 2021-08-03 DIAGNOSIS — Z6833 Body mass index (BMI) 33.0-33.9, adult: Secondary | ICD-10-CM

## 2021-08-03 DIAGNOSIS — O99019 Anemia complicating pregnancy, unspecified trimester: Secondary | ICD-10-CM

## 2021-08-03 DIAGNOSIS — D509 Iron deficiency anemia, unspecified: Secondary | ICD-10-CM

## 2021-08-03 LAB — POCT URINALYSIS DIPSTICK OB
Glucose, UA: NEGATIVE
POC,PROTEIN,UA: NEGATIVE

## 2021-08-03 NOTE — Progress Notes (Signed)
Routine Prenatal Care Visit  Subjective  Breanna Webb is a 36 y.o. [id number] at [redacted]w[redacted]d being seen today for ongoing prenatal care.  She is currently monitored for the following issues for this low-risk pregnancy and has Supervision of other normal pregnancy, antepartum; History of herpes genitalis; Anemia; and Pregnancy with history of ectopic pregnancy, antepartum on their problem list.  ----------------------------------------------------------------------------------- Patient reports  braxton-hicks contractions .  Doing well. Mood has been good.  Has occasional swelling in her right leg.  -Pt was told that she needed another [place], pt has no risk factors, BMI not over 35.  Will message Dr Okey Dupre to see if [place] is needed.  Contractions: Irritability. Vag. Bleeding: None.  Movement: Present. Leaking Fluid denies.  ----------------------------------------------------------------------------------- The following portions of the patient's history were reviewed and updated as appropriate: allergies, current medications, past family history, past medical history, past social history, past surgical history and problem list. Problem list updated.  Objective  Blood pressure 122/60, weight 171 lb (77.6 kg), last menstrual period 12/20/2020, not currently breastfeeding. Pregravid weight 160 lb (72.6 kg) Total Weight Gain 11 lb (4.99 kg) Urinalysis: Urine Protein Negative  Urine Glucose Negative  Fetal Status: Fetal Heart Rate (bpm): 135 Fundal Height: 32 cm Movement: Present     General:  Alert, oriented and cooperative. Patient is in no acute distress.  Skin: Skin is warm and dry. No rash noted.   Cardiovascular: Normal heart rate noted  Respiratory: Normal respiratory effort, no problems with respiration noted  Abdomen: Soft, gravid, appropriate for gestational age. Pain/Pressure: Absent     Pelvic:  Cervical exam deferred        Extremities: Normal range of motion.  Edema: None  Mental Status:  Normal mood and affect. Normal behavior. Normal judgment and thought content.   Assessment   36 y.o. [id number] at [redacted]w[redacted]d by  09/26/2021, by Last Menstrual Period presenting for routine prenatal visit  Plan   Pregnancy 7 Problems (from 02/24/21 to present)     Problem Noted Resolved   Supervision of other normal pregnancy, antepartum 02/24/2021 by Ellwood Sayers, CNM No   Overview Addendum 08/03/2021 11:45 AM by Ellwood Sayers, CNM     Nursing Staff Provider  Office Location  [facility] Dating  LMP = 11 ek [place]  Language  English  Anatomy US  normal  Flu Vaccine  declined Genetic Screen  NIPS: normal XY  TDaP vaccine   07/20/2021 Hgb A1C or  GTT Early : hgba1c 4.8 Third trimester : 1 hr 112  Covid unvaccinated   LAB RESULTS   Rhogam  Not needed Blood Type B/Positive/-- (01/03 0956)   Feeding Plan Breast/pumping Antibody Negative (01/03 0956)  Contraception Considering IUD Rubella 2.28 (01/03 0956)  Circumcision IFC RPR Non Reactive (01/03 0956)   Pediatrician   HBsAg Negative (01/03 0956)   Support Person  FOB: [place] HIV Non Reactive (01/03 0956)  Prenatal Classes  discussed Varicella Immune    GBS  (For PCN allergy, check sensitivities)   BTL Consent n/a    VBAC Consent  Pap  2023 NIL HPV+    Hgb Electro    Pelvis Tested  CF      SMA         Hx HSV prophylaxis at 36 wks []            Preterm labor symptoms and general obstetric precautions including but not limited to vaginal bleeding, contractions, leaking of fluid and fetal movement were reviewed in detail with the  patient. Please refer to After Visit Summary for other counseling recommendations.   Return in about 2 weeks (around 08/17/2021) for ROB.  CBC today  Seen by Gayland Curry today  Carie Caddy, CNM  [facility], [facility]  08/04/21  12:45 PM

## 2021-08-04 ENCOUNTER — Encounter: Payer: Self-pay | Admitting: Licensed Practical Nurse

## 2021-08-04 ENCOUNTER — Telehealth: Payer: Self-pay | Admitting: Licensed Practical Nurse

## 2021-08-04 LAB — CBC
Hematocrit: 30.5 % — ABNORMAL LOW (ref 34.0–46.6)
Hemoglobin: 9.8 g/dL — ABNORMAL LOW (ref 11.1–15.9)
MCH: 20.3 pg — ABNORMAL LOW (ref 26.6–33.0)
MCHC: 32.1 g/dL (ref 31.5–35.7)
MCV: 63 fL — ABNORMAL LOW (ref 79–97)
Platelets: 274 10*3/uL (ref 150–450)
RBC: 4.83 x10E6/uL (ref 3.77–5.28)
RDW: 16.5 % — ABNORMAL HIGH (ref 11.7–15.4)
WBC: 9.1 10*3/uL (ref 3.4–10.8)

## 2021-08-04 NOTE — Telephone Encounter (Signed)
Pt called back as she noticed she had a missed call.  Breanna Webb has been taking her Iron every other day.  Will now start taking her iron twice daily.  Aware that an Ultrasound has been ordered and scheduling should be calling. Carie Caddy, CNM  [facility]  @TODAY @  1:09 PM

## 2021-08-07 ENCOUNTER — Other Ambulatory Visit: Payer: Self-pay | Admitting: Licensed Practical Nurse

## 2021-08-07 DIAGNOSIS — Z6833 Body mass index (BMI) 33.0-33.9, adult: Secondary | ICD-10-CM

## 2021-08-07 DIAGNOSIS — Z3483 Encounter for supervision of other normal pregnancy, third trimester: Secondary | ICD-10-CM

## 2021-08-18 ENCOUNTER — Encounter: Payer: Self-pay | Admitting: Advanced Practice Midwife

## 2021-08-18 ENCOUNTER — Ambulatory Visit (INDEPENDENT_AMBULATORY_CARE_PROVIDER_SITE_OTHER): Payer: Medicaid Other | Admitting: Advanced Practice Midwife

## 2021-08-18 VITALS — BP 100/60 | Wt 173.0 lb

## 2021-08-18 DIAGNOSIS — Z3483 Encounter for supervision of other normal pregnancy, third trimester: Secondary | ICD-10-CM

## 2021-08-18 DIAGNOSIS — Z3A34 34 weeks gestation of pregnancy: Secondary | ICD-10-CM

## 2021-08-18 LAB — POCT URINALYSIS DIPSTICK OB: Glucose, UA: NEGATIVE

## 2021-08-18 NOTE — Progress Notes (Signed)
Routine Prenatal Care Visit  Subjective  Breanna Webb is a 36 y.o. [id number] at [redacted]w[redacted]d being seen today for ongoing prenatal care.  She is currently monitored for the following issues for this low-risk pregnancy and has Supervision of other normal pregnancy, antepartum; History of herpes genitalis; Anemia; and Pregnancy with history of ectopic pregnancy, antepartum on their problem list.  ----------------------------------------------------------------------------------- Patient reports no complaints.   Contractions: Not present. Vag. Bleeding: None.  Movement: Present. Leaking Fluid denies.  ----------------------------------------------------------------------------------- The following portions of the patient's history were reviewed and updated as appropriate: allergies, current medications, past family history, past medical history, past social history, past surgical history and problem list. Problem list updated.  Objective  Blood pressure 100/60, weight 173 lb (78.5 kg), last menstrual period 12/20/2020, not currently breastfeeding. Pregravid weight 160 lb (72.6 kg) Total Weight Gain 13 lb (5.897 kg) Urinalysis: Urine Protein Moderate (2+)  Urine Glucose Negative  Fetal Status: Fetal Heart Rate (bpm): 129 Fundal Height: 35 cm Movement: Present     General:  Alert, oriented and cooperative. Patient is in no acute distress.  Skin: Skin is warm and dry. No rash noted.   Cardiovascular: Normal heart rate noted  Respiratory: Normal respiratory effort, no problems with respiration noted  Abdomen: Soft, gravid, appropriate for gestational age. Pain/Pressure: Absent     Pelvic:  Cervical exam deferred        Extremities: Normal range of motion.  Edema: None  Mental Status: Normal mood and affect. Normal behavior. Normal judgment and thought content.   Assessment   36 y.o. [id number] at [redacted]w[redacted]d by  09/26/2021, by Last Menstrual Period presenting for routine prenatal visit  Plan   Pregnancy 7  Problems (from 02/24/21 to present)    Problem Noted Resolved   Supervision of other normal pregnancy, antepartum 02/24/2021 by Ellwood Sayers, CNM No   Overview Addendum 08/03/2021 11:45 AM by Ellwood Sayers, CNM     Nursing Staff Provider  Office Location  [facility] Dating  LMP = 11 ek [place]  Language  English  Anatomy US  normal  Flu Vaccine  declined Genetic Screen  NIPS: normal XY  TDaP vaccine   07/20/2021 Hgb A1C or  GTT Early : hgba1c 4.8 Third trimester : 1 hr 112  Covid unvaccinated   LAB RESULTS   Rhogam  Not needed Blood Type B/Positive/-- (01/03 0956)   Feeding Plan Breast/pumping Antibody Negative (01/03 0956)  Contraception Considering IUD Rubella 2.28 (01/03 0956)  Circumcision IFC RPR Non Reactive (01/03 0956)   Pediatrician   HBsAg Negative (01/03 0956)   Support Person  FOB: [place] HIV Non Reactive (01/03 0956)  Prenatal Classes  discussed Varicella Immune    GBS  (For PCN allergy, check sensitivities)   BTL Consent n/a    VBAC Consent  Pap  2023 NIL HPV+    Hgb Electro    Pelvis Tested  CF      SMA         Hx HSV prophylaxis at 36 wks []           Preterm labor symptoms and general obstetric precautions including but not limited to vaginal bleeding, contractions, leaking of fluid and fetal movement were reviewed in detail with the patient. Please refer to After Visit Summary for other counseling recommendations.   Return in about 2 weeks (around 09/01/2021) for rob.  11/02/2021, CNM 08/18/2021 10:27 AM

## 2021-08-18 NOTE — Patient Instructions (Signed)

## 2021-08-20 ENCOUNTER — Ambulatory Visit
Admission: RE | Admit: 2021-08-20 | Discharge: 2021-08-20 | Disposition: A | Discharge status: Home / Self Care | Payer: Medicaid Other | Source: Ambulatory Visit | Attending: Licensed Practical Nurse | Admitting: Licensed Practical Nurse

## 2021-08-20 DIAGNOSIS — Z3A35 35 weeks gestation of pregnancy: Secondary | ICD-10-CM | POA: Insufficient documentation

## 2021-08-20 DIAGNOSIS — Z3483 Encounter for supervision of other normal pregnancy, third trimester: Secondary | ICD-10-CM

## 2021-08-20 DIAGNOSIS — Z3689 Encounter for other specified antenatal screening: Secondary | ICD-10-CM | POA: Insufficient documentation

## 2021-08-20 DIAGNOSIS — Z6833 Body mass index (BMI) 33.0-33.9, adult: Secondary | ICD-10-CM

## 2021-08-31 ENCOUNTER — Encounter: Payer: Self-pay | Admitting: Family Medicine

## 2021-08-31 ENCOUNTER — Encounter: Payer: Self-pay | Admitting: Advanced Practice Midwife

## 2021-08-31 ENCOUNTER — Telehealth: Payer: Self-pay | Admitting: Licensed Practical Nurse

## 2021-08-31 ENCOUNTER — Encounter: Payer: Medicaid Other | Admitting: Advanced Practice Midwife

## 2021-08-31 NOTE — Telephone Encounter (Signed)
I contacted patient via phone about missed appointment scheduled for today. Phone on file not accepting calls at this time.I was unable to leave message

## 2021-09-07 ENCOUNTER — Ambulatory Visit (INDEPENDENT_AMBULATORY_CARE_PROVIDER_SITE_OTHER): Payer: Medicaid Other | Admitting: Obstetrics

## 2021-09-07 ENCOUNTER — Other Ambulatory Visit (HOSPITAL_COMMUNITY)
Admission: RE | Admit: 2021-09-07 | Discharge: 2021-09-07 | Disposition: A | Discharge status: Home / Self Care | Payer: Medicaid Other | Source: Ambulatory Visit | Attending: Obstetrics | Admitting: Obstetrics

## 2021-09-07 VITALS — BP 122/70 | Wt 175.0 lb

## 2021-09-07 DIAGNOSIS — Z3483 Encounter for supervision of other normal pregnancy, third trimester: Secondary | ICD-10-CM | POA: Insufficient documentation

## 2021-09-07 DIAGNOSIS — Z113 Encounter for screening for infections with a predominantly sexual mode of transmission: Secondary | ICD-10-CM | POA: Diagnosis not present

## 2021-09-07 DIAGNOSIS — Z3A37 37 weeks gestation of pregnancy: Secondary | ICD-10-CM

## 2021-09-07 DIAGNOSIS — Z8619 Personal history of other infectious and parasitic diseases: Secondary | ICD-10-CM

## 2021-09-07 MED ORDER — VALACYCLOVIR HCL 500 MG PO TABS: 500.0000 mg | ORAL_TABLET | Freq: Two times a day (BID) | ORAL | 6 refills | Status: DC

## 2021-09-07 NOTE — Progress Notes (Signed)
No vb. No lof. GBS today.  

## 2021-09-07 NOTE — Progress Notes (Signed)
Routine Prenatal Care Visit  Subjective  Breanna Webb is a 36 y.o. [id number] at [redacted]w[redacted]d being seen today for ongoing prenatal care.  She is currently monitored for the following issues for this high-risk pregnancy and has Supervision of other normal pregnancy, antepartum; History of herpes genitalis; Anemia; and Pregnancy with history of ectopic pregnancy, antepartum on their problem list.  ----------------------------------------------------------------------------------- Patient reports no complaints.   Contractions: Not present. Vag. Bleeding: None.  Movement: Present. Leaking Fluid denies.  ----------------------------------------------------------------------------------- The following portions of the patient's history were reviewed and updated as appropriate: allergies, current medications, past family history, past medical history, past social history, past surgical history and problem list. Problem list updated.  Objective  Blood pressure 122/70, weight 175 lb (79.4 kg), last menstrual period 12/20/2020, not currently breastfeeding. Pregravid weight 160 lb (72.6 kg) Total Weight Gain 15 lb (6.804 kg) Urinalysis: Urine Protein    Urine Glucose    Fetal Status:     Movement: Present     General:  Alert, oriented and cooperative. Patient is in no acute distress.  Skin: Skin is warm and dry. No rash noted.   Cardiovascular: Normal heart rate noted  Respiratory: Normal respiratory effort, no problems with respiration noted  Abdomen: Soft, gravid, appropriate for gestational age. Pain/Pressure: Present     Pelvic:  cervix is anterior, 1.5cms dilated/50%/ballotable        Extremities: Normal range of motion.     Mental Status: Normal mood and affect. Normal behavior. Normal judgment and thought content.   Assessment   36 y.o. [id number] at [redacted]w[redacted]d by  09/26/2021, by Last Menstrual Period presenting for routine prenatal visit  Plan   Pregnancy 7 Problems (from 02/24/21 to present)     Problem Noted Resolved   Supervision of other normal pregnancy, antepartum 02/24/2021 by Ellwood Sayers, CNM No   Overview Addendum 08/03/2021 11:45 AM by Ellwood Sayers, CNM     Nursing Staff Provider  Office Location  [facility] Dating  LMP = 11 ek [place]  Language  English  Anatomy US  normal  Flu Vaccine  declined Genetic Screen  NIPS: normal XY  TDaP vaccine   07/20/2021 Hgb A1C or  GTT Early : hgba1c 4.8 Third trimester : 1 hr 112  Covid unvaccinated   LAB RESULTS   Rhogam  Not needed Blood Type B/Positive/-- (01/03 0956)   Feeding Plan Breast/pumping Antibody Negative (01/03 0956)  Contraception Considering IUD Rubella 2.28 (01/03 0956)  Circumcision IFC RPR Non Reactive (01/03 0956)   Pediatrician   HBsAg Negative (01/03 0956)   Support Person  FOB: [place] HIV Non Reactive (01/03 0956)  Prenatal Classes  discussed Varicella Immune    GBS  (For PCN allergy, check sensitivities)   BTL Consent n/a    VBAC Consent  Pap  2023 NIL HPV+    Hgb Electro    Pelvis Tested  CF      SMA         Hx HSV prophylaxis at 36 wks []           Term labor symptoms and general obstetric precautions including but not limited to vaginal bleeding, contractions, leaking of fluid and fetal movement were reviewed in detail with the patient. Please refer to After Visit Summary for other counseling recommendations.  Her chart lists + HSV in her history. Started on daily Valtrex.  Return in about 1 week (around 09/14/2021) for return OB.  09/16/2021, CNM  09/07/2021 3:42 PM

## 2021-09-09 LAB — CERVICOVAGINAL ANCILLARY ONLY
Chlamydia: NEGATIVE
Comment: NEGATIVE
Comment: NORMAL
Neisseria Gonorrhea: NEGATIVE

## 2021-09-11 LAB — CULTURE, BETA STREP (GROUP B ONLY): Strep Gp B Culture: NEGATIVE

## 2021-09-15 ENCOUNTER — Inpatient Hospital Stay: Payer: Medicaid Other | Admitting: Anesthesiology

## 2021-09-15 ENCOUNTER — Encounter: Payer: Self-pay | Admitting: Obstetrics and Gynecology

## 2021-09-15 ENCOUNTER — Telehealth: Payer: Self-pay | Admitting: Family Medicine

## 2021-09-15 ENCOUNTER — Encounter: Payer: Medicaid Other | Admitting: Family Medicine

## 2021-09-15 ENCOUNTER — Inpatient Hospital Stay
Admission: EM | Admit: 2021-09-15 | Discharge: 2021-09-16 | DRG: 806 | Disposition: A | Discharge status: Home / Self Care | Payer: Medicaid Other | Attending: Obstetrics and Gynecology | Admitting: Obstetrics and Gynecology

## 2021-09-15 ENCOUNTER — Other Ambulatory Visit: Payer: Self-pay

## 2021-09-15 DIAGNOSIS — O9832 Other infections with a predominantly sexual mode of transmission complicating childbirth: Secondary | ICD-10-CM | POA: Diagnosis not present

## 2021-09-15 DIAGNOSIS — D649 Anemia, unspecified: Secondary | ICD-10-CM | POA: Diagnosis not present

## 2021-09-15 DIAGNOSIS — Z3490 Encounter for supervision of normal pregnancy, unspecified, unspecified trimester: Secondary | ICD-10-CM

## 2021-09-15 DIAGNOSIS — O9902 Anemia complicating childbirth: Principal | ICD-10-CM | POA: Diagnosis present

## 2021-09-15 DIAGNOSIS — A6000 Herpesviral infection of urogenital system, unspecified: Secondary | ICD-10-CM | POA: Diagnosis not present

## 2021-09-15 DIAGNOSIS — O26893 Other specified pregnancy related conditions, third trimester: Secondary | ICD-10-CM | POA: Diagnosis not present

## 2021-09-15 DIAGNOSIS — O99013 Anemia complicating pregnancy, third trimester: Secondary | ICD-10-CM | POA: Diagnosis present

## 2021-09-15 DIAGNOSIS — Z3A38 38 weeks gestation of pregnancy: Secondary | ICD-10-CM

## 2021-09-15 LAB — RPR: RPR Ser Ql: NONREACTIVE

## 2021-09-15 LAB — CBC
HCT: 30.4 % — ABNORMAL LOW (ref 36.0–46.0)
Hemoglobin: 10.1 g/dL — ABNORMAL LOW (ref 12.0–15.0)
MCH: 20.8 pg — ABNORMAL LOW (ref 26.0–34.0)
MCHC: 33.2 g/dL (ref 30.0–36.0)
MCV: 62.7 fL — ABNORMAL LOW (ref 80.0–100.0)
Platelets: 202 10*3/uL (ref 150–400)
RBC: 4.85 MIL/uL (ref 3.87–5.11)
RDW: 17.4 % — ABNORMAL HIGH (ref 11.5–15.5)
WBC: 10.6 10*3/uL — ABNORMAL HIGH (ref 4.0–10.5)
nRBC: 0 % (ref 0.0–0.2)

## 2021-09-15 MED ORDER — EPHEDRINE 5 MG/ML INJ: 10.0000 mg | INTRAVENOUS | Status: DC | PRN

## 2021-09-15 MED ORDER — PRENATAL MULTIVITAMIN CH
1.0000 | ORAL_TABLET | Freq: Every day | ORAL | Status: DC
  Administered 2021-09-16: 1 via ORAL
  Filled 2021-09-15: qty 1

## 2021-09-15 MED ORDER — LIDOCAINE-EPINEPHRINE (PF) 1.5 %-1:200000 IJ SOLN
INTRAMUSCULAR | Status: DC | PRN
  Administered 2021-09-15: 3 mL via PERINEURAL

## 2021-09-15 MED ORDER — PHENYLEPHRINE 80 MCG/ML (10ML) SYRINGE FOR IV PUSH (FOR BLOOD PRESSURE SUPPORT): 80.0000 ug | PREFILLED_SYRINGE | INTRAVENOUS | Status: DC | PRN

## 2021-09-15 MED ORDER — LIDOCAINE HCL (PF) 1 % IJ SOLN: 30.0000 mL | INTRAMUSCULAR | Status: DC | PRN

## 2021-09-15 MED ORDER — OXYCODONE-ACETAMINOPHEN 5-325 MG PO TABS: 1.0000 | ORAL_TABLET | ORAL | Status: DC | PRN

## 2021-09-15 MED ORDER — SIMETHICONE 80 MG PO CHEW: 80.0000 mg | CHEWABLE_TABLET | ORAL | Status: DC | PRN

## 2021-09-15 MED ORDER — DIPHENHYDRAMINE HCL 25 MG PO CAPS: 25.0000 mg | ORAL_CAPSULE | Freq: Four times a day (QID) | ORAL | Status: DC | PRN

## 2021-09-15 MED ORDER — OXYTOCIN-SODIUM CHLORIDE 30-0.9 UT/500ML-% IV SOLN: 2.5000 [IU]/h | INTRAVENOUS | Status: DC

## 2021-09-15 MED ORDER — FENTANYL-BUPIVACAINE-NACL 0.5-0.125-0.9 MG/250ML-% EP SOLN
EPIDURAL | Status: AC
  Filled 2021-09-15: qty 250

## 2021-09-15 MED ORDER — OXYTOCIN-SODIUM CHLORIDE 30-0.9 UT/500ML-% IV SOLN: 2.5000 [IU]/h | INTRAVENOUS | Status: DC | PRN

## 2021-09-15 MED ORDER — FENTANYL-BUPIVACAINE-NACL 0.5-0.125-0.9 MG/250ML-% EP SOLN
12.0000 mL/h | EPIDURAL | Status: DC | PRN
  Administered 2021-09-15: 12 mL/h via EPIDURAL

## 2021-09-15 MED ORDER — IBUPROFEN 600 MG PO TABS
600.0000 mg | ORAL_TABLET | Freq: Four times a day (QID) | ORAL | Status: DC
  Administered 2021-09-15 – 2021-09-16 (×4): 600 mg via ORAL
  Filled 2021-09-15 (×4): qty 1

## 2021-09-15 MED ORDER — ONDANSETRON HCL 4 MG/2ML IJ SOLN: 4.0000 mg | Freq: Four times a day (QID) | INTRAMUSCULAR | Status: DC | PRN

## 2021-09-15 MED ORDER — OXYTOCIN BOLUS FROM INFUSION: 333.0000 mL | Freq: Once | INTRAVENOUS | Status: DC

## 2021-09-15 MED ORDER — OXYTOCIN-SODIUM CHLORIDE 30-0.9 UT/500ML-% IV SOLN
INTRAVENOUS | Status: AC
  Filled 2021-09-15: qty 500

## 2021-09-15 MED ORDER — BUPIVACAINE HCL (PF) 0.25 % IJ SOLN
INTRAMUSCULAR | Status: DC | PRN
  Administered 2021-09-15 (×2): 4 mL via EPIDURAL

## 2021-09-15 MED ORDER — DOCUSATE SODIUM 100 MG PO CAPS
100.0000 mg | ORAL_CAPSULE | Freq: Two times a day (BID) | ORAL | Status: DC
  Administered 2021-09-15 – 2021-09-16 (×2): 100 mg via ORAL
  Filled 2021-09-15 (×2): qty 1

## 2021-09-15 MED ORDER — ACETAMINOPHEN 325 MG PO TABS
650.0000 mg | ORAL_TABLET | ORAL | Status: DC | PRN
Start: 2021-09-15 — End: 2021-09-15

## 2021-09-15 MED ORDER — SOD CITRATE-CITRIC ACID 500-334 MG/5ML PO SOLN: 30.0000 mL | ORAL | Status: DC | PRN

## 2021-09-15 MED ORDER — OXYCODONE-ACETAMINOPHEN 5-325 MG PO TABS: 2.0000 | ORAL_TABLET | ORAL | Status: DC | PRN

## 2021-09-15 MED ORDER — LACTATED RINGERS IV SOLN: 500.0000 mL | INTRAVENOUS | Status: DC | PRN

## 2021-09-15 MED ORDER — ACETAMINOPHEN 325 MG PO TABS
650.0000 mg | ORAL_TABLET | ORAL | Status: DC | PRN
  Administered 2021-09-16: 650 mg via ORAL
  Filled 2021-09-15 (×2): qty 2

## 2021-09-15 MED ORDER — OXYTOCIN 10 UNIT/ML IJ SOLN
INTRAMUSCULAR | Status: AC
  Filled 2021-09-15: qty 2

## 2021-09-15 MED ORDER — LIDOCAINE HCL (PF) 1 % IJ SOLN
INTRAMUSCULAR | Status: DC | PRN
  Administered 2021-09-15: 2 mL

## 2021-09-15 MED ORDER — BENZOCAINE-MENTHOL 20-0.5 % EX AERO
1.0000 | INHALATION_SPRAY | CUTANEOUS | Status: DC | PRN
  Administered 2021-09-16: 1 via TOPICAL
  Filled 2021-09-15: qty 56

## 2021-09-15 MED ORDER — DIPHENHYDRAMINE HCL 50 MG/ML IJ SOLN: 12.5000 mg | INTRAMUSCULAR | Status: DC | PRN

## 2021-09-15 MED ORDER — MISOPROSTOL 200 MCG PO TABS
ORAL_TABLET | ORAL | Status: AC
  Filled 2021-09-15: qty 4

## 2021-09-15 MED ORDER — LIDOCAINE HCL (PF) 1 % IJ SOLN
INTRAMUSCULAR | Status: AC
  Filled 2021-09-15: qty 30

## 2021-09-15 MED ORDER — AMMONIA AROMATIC IN INHA
RESPIRATORY_TRACT | Status: AC
  Filled 2021-09-15: qty 10

## 2021-09-15 MED ORDER — TERBUTALINE SULFATE 1 MG/ML IJ SOLN: 0.2500 mg | Freq: Once | INTRAMUSCULAR | Status: DC | PRN

## 2021-09-15 MED ORDER — OXYTOCIN-SODIUM CHLORIDE 30-0.9 UT/500ML-% IV SOLN
1.0000 m[IU]/min | INTRAVENOUS | Status: DC
  Administered 2021-09-15: 2 m[IU]/min via INTRAVENOUS

## 2021-09-15 MED ORDER — LACTATED RINGERS IV SOLN: INTRAVENOUS | Status: DC

## 2021-09-15 MED ORDER — LACTATED RINGERS IV SOLN: 500.0000 mL | Freq: Once | INTRAVENOUS | Status: DC

## 2021-09-15 NOTE — Anesthesia Procedure Notes (Signed)
Epidural Patient location during procedure: OB Start time: 09/15/2021 8:30 AM End time: 09/15/2021 8:45 AM  Staffing Anesthesiologist: Corinda Gubler, MD Resident/CRNA: Karoline Caldwell, CRNA Performed: resident/CRNA   Preanesthetic Checklist Completed: patient identified, IV checked, site marked, risks and benefits discussed, surgical consent, monitors and equipment checked, pre-op evaluation and timeout performed  Epidural Patient position: sitting Prep: ChloraPrep Patient monitoring: heart rate, continuous pulse ox and blood pressure Approach: midline Location: L3-L4 Injection technique: LOR air  Needle:  Needle type: Tuohy  Needle gauge: 18 G Needle length: 9 cm Needle insertion depth: 6 cm Catheter type: closed end flexible Catheter size: 20 Guage Catheter at skin depth: 10 cm Test dose: 1.5% lidocaine with Epi 1:200 K  Additional Notes Reason for block:procedure for pain

## 2021-09-15 NOTE — Telephone Encounter (Signed)
Contacted pt to reschedule appointment was scheduled for 7/18 at 1:35.  Could not leave message on the phone.  Said the person was not available. Did not give option to leave a message.

## 2021-09-15 NOTE — Progress Notes (Signed)
LABOR NOTE   SUBJECTIVE:   Breanna Webb is a 36 y.o.GP@ at [redacted]w[redacted]d who presented to L&D in spontaneous labor. After several hours of minimal cervical change, Pitocin was started. She is contracting regularly and comfortable with her epidural.  Analgesia: Epidural  OBJECTIVE:  BP 103/72   Pulse 61   Temp 98.2 F (36.8 C) (Oral)   Resp 18   Ht 5' (1.524 m)   Wt 79.4 kg   LMP 12/20/2020 (Exact Date)   SpO2 100%   BMI 34.18 kg/m  No intake/output data recorded.  SVE:   Dilation: 7 Effacement (%): 80 Station: -2 Exam by:: B. Gean Quint RN CONTRACTIONS: regular, every 2 minutes FHR: Fetal heart tracing reviewed. Baseline: 130 Variability: moderate Accelerations: present Decelerations:early Category 1  Labs: Lab Results  Component Value Date   WBC 10.6 (H) 09/15/2021   HGB 10.1 (L) 09/15/2021   HCT 30.4 (L) 09/15/2021   MCV 62.7 (L) 09/15/2021   PLT 202 09/15/2021    ASSESSMENT: 1) Augmentation of labor, progressing well     Coping:     Membranes: ruptured, clear fluid   Active Problems:   Pregnancy   PLAN: Continue present management Anticipate NSVD Dr. Okey Dupre updated  Guadlupe Spanish, CNM 09/15/2021 4:11 PM

## 2021-09-15 NOTE — H&P (Signed)
History and Physical   HPI  Breanna Webb is a 36 y.o. [id number] at [redacted]w[redacted]d Estimated Date of Delivery: 09/26/21 who is being admitted for labor management. She began having contractions around 0330. She denies LOF. She endorses fetal movement.    OB History  OB History  Gravida Para Term Preterm AB Living  7 4 4  0 2 4  SAB IAB Ectopic Multiple Live Births  1 0 1 0 4    # Outcome Date GA Lbr Len/2nd Weight Sex Delivery Anes PTL Lv  7 Current           6 Ectopic 09/2020          5 Term 05/14/20 [redacted]w[redacted]d 36:23 / 00:10 2680 g F Vag-Spont EPI  LIV     Name: [facility]     Apgar1: 9  Apgar5: 10  4 Term 03/29/13    03/31/13 Vag-Spont  N LIV  3 SAB 2015          2 Term 10/11/09    F Vag-Spont  N LIV  1 Term 08/11/06    F Vag-Spont  N LIV    PROBLEM LIST  Pregnancy complications or risks: Patient Active Problem List   Diagnosis Date Noted   Pregnancy 09/15/2021   Pregnancy with history of ectopic pregnancy, antepartum 07/20/2021   Supervision of other normal pregnancy, antepartum 02/24/2021   History of herpes genitalis 02/24/2021   Anemia 10/16/2020    Prenatal labs and studies: ABO, Rh: --/--/PENDING (07/18 05-10-1989) Antibody: PENDING (07/18 0733) Rubella: 2.28 (01/03 0956) RPR: Non Reactive (05/10 1535)  HBsAg: Negative (01/03 0956)  HIV: Non Reactive (05/10 1535)  [birth year]1982-- (07/10 1600)   Past Medical History:  Diagnosis Date   Anemia    Patient reports anemia with her pregnancies   Infection    UTI   Miscarriage    Vaginal Pap smear, abnormal      Past Surgical History:  Procedure Laterality Date   galbladder removed     SALPINGECTOMY Left 09/2020     Medications    Current Discharge Medication List     CONTINUE these medications which have NOT CHANGED   Details  ferrous sulfate (FERROUSUL) 325 (65 FE) MG tablet Take 1 tablet (325 mg total) by mouth every other day. Qty: 60 tablet, Refills: 1    valACYclovir (VALTREX) 500 MG tablet Take 1  tablet (500 mg total) by mouth 2 (two) times daily. Qty: 60 tablet, Refills: 6   Associated Diagnoses: History of herpes genitalis         Allergies  Patient has no known allergies.  Review of Systems  Pertinent items are noted in HPI.  Physical Exam  BP 112/72 (BP Location: Right Arm)   Pulse 80   Temp 98.6 F (37 C) (Oral)   Resp 18   Ht 5' (1.524 m)   Wt 79.4 kg   LMP 12/20/2020 (Exact Date)   BMI 34.18 kg/m   Lungs:  CTAB Cardio: RRR without M/R/G Abd: Soft, gravid, NT Presentation: cephalic, confirmed by 12/22/2020   CERVIX: Dilation: 6.5 Effacement (%): 70 Cervical Position: Anterior Station: -2, -1 Presentation: Vertex Exam by:: [email or web address] RN  See Prenatal records for more detailed PE.     FHR:  Baseline: 120 Variability: moderate Accelerations: present, 15x15 Decelerations: absent Toco: regular, every 4-5 minutes Category 1  Test Results  Results for orders placed or performed during the hospital encounter of 09/15/21 (from the past 24 hour(s))  CBC  Status: Abnormal   Collection Time: 09/15/21  7:33 AM  Result Value Ref Range   WBC 10.6 (H) 4.0 - 10.5 K/uL   RBC 4.85 3.87 - 5.11 MIL/uL   Hemoglobin 10.1 (L) 12.0 - 15.0 g/dL   HCT 50.3 (L) 54.6 - 56.8 %   MCV 62.7 (L) 80.0 - 100.0 fL   MCH 20.8 (L) 26.0 - 34.0 pg   MCHC 33.2 30.0 - 36.0 g/dL   RDW 12.7 (H) 51.7 - 00.1 %   Platelets 202 150 - 400 K/uL   nRBC 0.0 0.0 - 0.2 %  Type and screen [facility]     Status: None (Preliminary result)   Collection Time: 09/15/21  7:33 AM  Result Value Ref Range   ABO/RH(D) PENDING    Antibody Screen PENDING    Sample Expiration      09/18/2021,2359 Performed at [facility], [address], [place], [place] [address]    Group B Strep negative  Assessment   [id number] at [redacted]w[redacted]d Estimated Date of Delivery: 09/26/21  Reassuring maternal/fetal status.  Patient Active Problem List   Diagnosis Date Noted   Pregnancy  09/15/2021   Pregnancy with history of ectopic pregnancy, antepartum 07/20/2021   Supervision of other normal pregnancy, antepartum 02/24/2021   History of herpes genitalis 02/24/2021   Anemia 10/16/2020    Plan  1. Admit to L&D   2. EFM: Continuous -- Category 1 3. Epidural when desired.   4. Admission labs  5. Anticipate NSVD 6. AROM for clear fluid 7. MD notified of admission  Guadlupe Spanish, [facility] 09/15/2021 8:39 AM

## 2021-09-15 NOTE — Anesthesia Preprocedure Evaluation (Signed)
Anesthesia Evaluation  Patient identified by MRN, date of birth, ID band Patient awake    Reviewed: Allergy & Precautions, H&P , NPO status , Patient's Chart, lab work & pertinent test results  Airway Mallampati: II  TM Distance: <3 FB Neck ROM: full    Dental no notable dental hx. (+) Teeth Intact   Pulmonary neg pulmonary ROS, former smoker,    Pulmonary exam normal        Cardiovascular Exercise Tolerance: Good negative cardio ROS Normal cardiovascular exam Rhythm:regular Rate:Normal     Neuro/Psych negative neurological ROS     GI/Hepatic negative GI ROS, Neg liver ROS,   Endo/Other  negative endocrine ROS  Renal/GU negative Renal ROS  negative genitourinary   Musculoskeletal   Abdominal   Peds  Hematology  (+) Blood dyscrasia, anemia ,   Anesthesia Other Findings   Reproductive/Obstetrics (+) Pregnancy                             Anesthesia Physical Anesthesia Plan  ASA: 2  Anesthesia Plan: Epidural   Post-op Pain Management:    Induction:   PONV Risk Score and Plan: 2 and Ondansetron and Dexamethasone  Airway Management Planned:   Additional Equipment:   Intra-op Plan:   Post-operative Plan:   Informed Consent: I have reviewed the patients History and Physical, chart, labs and discussed the procedure including the risks, benefits and alternatives for the proposed anesthesia with the patient or authorized representative who has indicated his/her understanding and acceptance.     Dental Advisory Given  Plan Discussed with: CRNA and Anesthesiologist  Anesthesia Plan Comments:         Anesthesia Quick Evaluation

## 2021-09-15 NOTE — Discharge Summary (Signed)
Postpartum Discharge Summary  Date of Service updated: 09/16/2021     Patient Name: Breanna Webb DOB: [birth year] MRN: [id number]  Date of admission: 09/15/2021 Delivery date:09/15/2021  Delivering provider: Lurlean Horns  Date of discharge: 09/16/2021  Admitting diagnosis: Pregnancy [Z34.90] Intrauterine pregnancy: [redacted]w[redacted]d    Secondary diagnosis:  Active Problems:   Postpartum care following vaginal delivery   Pregnancy   [redacted] weeks gestation of pregnancy   Encounter for care or examination of lactating mother  Additional problems: None    Discharge diagnosis: Term Pregnancy Delivered                                              Post partum procedures: none Augmentation: AROM and Pitocin Complications: None  Hospital course: Onset of Labor With Vaginal Delivery      36y.o. yo [id number] 328w3das admitted in Active Labor on 09/15/2021. Patient had an uncomplicated labor course as follows:  Membrane Rupture Time/Date: 9:30 AM ,09/15/2021   Delivery Method:Vaginal, Spontaneous  Episiotomy: None  Lacerations:  None  See delivery note for details  Patient had an uncomplicated postpartum course.  She is ambulating, tolerating a regular diet, passing flatus, and urinating well. Patient is discharged home in stable condition on 09/16/21.  Newborn Data: Birth date:09/15/2021  Birth time:5:34 PM  Gender:Female  Living status:Living  Apgars:8 ,9  Weight:2650 g   Magnesium Sulfate received: No BMZ received: No Rhophylac:N/A MMR:N/A T-DaP:Given prenatally Flu: N/A Transfusion:No  Physical exam  Vitals:   09/16/21 0019 09/16/21 0300 09/16/21 0813 09/16/21 1600  BP: 116/81 100/75 105/77 (!) 107/55  Pulse: (!) 58 65 60 66  Resp: 18 18 19 18   Temp: 98.8 F (37.1 C) 98 F (36.7 C) 98.5 F (36.9 C) 98.6 F (37 C)  TempSrc:  Oral Oral Oral  SpO2: 100% 100% 97% 96%  Weight:      Height:       General: alert, cooperative, and no distress Lochia:  appropriate Uterine Fundus: firm Incision: N/A DVT Evaluation: No evidence of DVT seen on physical exam. Labs: Lab Results  Component Value Date   WBC 15.8 (H) 09/16/2021   HGB 9.4 (L) 09/16/2021   HCT 28.3 (L) 09/16/2021   MCV 63.0 (L) 09/16/2021   PLT 163 09/16/2021      Latest Ref Rng & Units 10/04/2020    9:20 PM  CMP  Glucose 70 - 99 mg/dL 153   BUN 6 - 20 mg/dL 7   Creatinine 0.44 - 1.00 mg/dL 0.53   Sodium 135 - 145 mmol/L 133   Potassium 3.5 - 5.1 mmol/L 3.7   Chloride 98 - 111 mmol/L 105   CO2 22 - 32 mmol/L 23   Calcium 8.9 - 10.3 mg/dL 8.4   Total Protein 6.5 - 8.1 g/dL 7.0   Total Bilirubin 0.3 - 1.2 mg/dL 0.8   Alkaline Phos 38 - 126 U/L 48   AST 15 - 41 U/L 18   ALT 0 - 44 U/L 17    Edinburgh Score:    09/15/2021    9:00 PM  Edinburgh Postnatal Depression Scale Screening Tool  I have been able to laugh and see the funny side of things. 0  I have looked forward with enjoyment to things. 0  I have blamed myself unnecessarily when things went wrong. 1  I  have been anxious or worried for no good reason. 0  I have felt scared or panicky for no good reason. 1  Things have been getting on top of me. 1  I have been so unhappy that I have had difficulty sleeping. 0  I have felt sad or miserable. 0  I have been so unhappy that I have been crying. 0  The thought of harming myself has occurred to me. 0  Edinburgh Postnatal Depression Scale Total 3      After visit meds:  Allergies as of 09/16/2021   No Known Allergies      Medication List     STOP taking these medications    valACYclovir 500 MG tablet Commonly known as: VALTREX       TAKE these medications    ferrous sulfate 325 (65 FE) MG tablet Commonly known as: FerrouSul Take 1 tablet (325 mg total) by mouth every other day.         Discharge home in stable condition Infant Feeding: Breast Infant Disposition:home with mother Discharge instruction: per After Visit Summary and  Postpartum booklet. Activity: Advance as tolerated. Pelvic rest for 6 weeks.  Diet: routine diet Anticipated Birth Control: IUD Postpartum Appointment: 2 weeks Additional Postpartum F/U:  none Future Appointments:No future appointments. Follow up Visit:  [place]. Schedule an appointment as soon as possible for a visit.   Why: Call to schedule visits at 2 weeks and 6 weeks postpartum Contact information: [address] [place] [phone]                  09/16/2021 Rod Can, CNM

## 2021-09-15 NOTE — OB Triage Note (Signed)
Pt is G7P4 at 38.3 weeks with UCs starting at 0330. Awoke her from sleep. +FM. Pt denies OB complications with this pregnancy. Seen in office last week cervix 1.5cm per pt. FHR 120

## 2021-09-15 NOTE — Anesthesia Procedure Notes (Deleted)
Epidural Patient location during procedure: OB  Staffing Anesthesiologist: Corinda Gubler, MD Resident/CRNA: Hezzie Bump, CRNA Performed: resident/CRNA   Preanesthetic Checklist Completed: patient identified, IV checked, site marked, risks and benefits discussed, surgical consent, monitors and equipment checked, pre-op evaluation and timeout performed  Epidural Patient position: sitting Prep: ChloraPrep Patient monitoring: heart rate, continuous pulse ox and blood pressure Approach: midline Location: L3-L4 Injection technique: LOR saline  Needle:  Needle type: Tuohy  Needle gauge: 17 G Needle length: 9 cm and 9 Needle insertion depth: 7 cm Catheter type: closed end flexible Catheter size: 19 Gauge Catheter at skin depth: 13 cm Test dose: negative and 1.5% lidocaine with Epi 1:200 K  Assessment Sensory level: T10 Events: blood not aspirated, injection not painful, no injection resistance, no paresthesia and negative IV test  Additional Notes 1 attempt Pt. Evaluated and documentation done after procedure finished. Patient identified. Risks/Benefits/Options discussed with patient including but not limited to bleeding, infection, nerve damage, paralysis, failed block, incomplete pain control, headache, blood pressure changes, nausea, vomiting, reactions to medication both or allergic, itching and postpartum back pain. Confirmed with bedside nurse the patient's most recent platelet count. Confirmed with patient that they are not currently taking any anticoagulation, have any bleeding history or any family history of bleeding disorders. Patient expressed understanding and wished to proceed. All questions were answered. Sterile technique was used throughout the entire procedure. Please see nursing notes for vital signs. Test dose was given through epidural catheter and negative prior to continuing to dose epidural or start infusion. Warning signs of high block given to the patient  including shortness of breath, tingling/numbness in hands, complete motor block, or any concerning symptoms with instructions to call for help. Patient was given instructions on fall risk and not to get out of bed. All questions and concerns addressed with instructions to call with any issues or inadequate analgesia.    Patient tolerated the insertion well without immediate complications.Reason for block:procedure for pain

## 2021-09-16 DIAGNOSIS — Z3A38 38 weeks gestation of pregnancy: Secondary | ICD-10-CM

## 2021-09-16 LAB — CBC
HCT: 28.3 % — ABNORMAL LOW (ref 36.0–46.0)
Hemoglobin: 9.4 g/dL — ABNORMAL LOW (ref 12.0–15.0)
MCH: 20.9 pg — ABNORMAL LOW (ref 26.0–34.0)
MCHC: 33.2 g/dL (ref 30.0–36.0)
MCV: 63 fL — ABNORMAL LOW (ref 80.0–100.0)
Platelets: 163 10*3/uL (ref 150–400)
RBC: 4.49 MIL/uL (ref 3.87–5.11)
RDW: 17 % — ABNORMAL HIGH (ref 11.5–15.5)
WBC: 15.8 10*3/uL — ABNORMAL HIGH (ref 4.0–10.5)
nRBC: 0 % (ref 0.0–0.2)

## 2021-09-16 NOTE — Progress Notes (Signed)
Post Partum Day # 1, s/p SVD  Subjective: no complaints, up ad lib, voiding, and tolerating PO. Reports bleeding is well controlled. Pain is minimal.   Objective: Temp:  [98 F (36.7 C)-98.8 F (37.1 C)] 98.5 F (36.9 C) (07/19 0813) Pulse Rate:  [58-70] 60 (07/19 0813) Resp:  [18-20] 19 (07/19 0813) BP: (100-122)/(71-81) 105/77 (07/19 0813) SpO2:  [97 %-100 %] 97 % (07/19 0813)  Physical Exam:  General: alert and no distress  Lungs: clear to auscultation bilaterally Breasts: normal appearance, no masses or tenderness Heart: regular rate and rhythm, S1, S2 normal, no murmur, click, rub or gallop Abdomen: soft, non-tender; bowel sounds normal; no masses,  no organomegaly Pelvis: Lochia: appropriate, Uterine Fundus: firm Extremities: DVT Evaluation: No evidence of DVT seen on physical exam. Negative Homan's sign. No cords or calf tenderness.  Recent Labs    09/15/21 0733 09/16/21 0554  HGB 10.1* 9.4*  HCT 30.4* 28.3*    Assessment/Plan:  Doing well postpartum.  Breastfeeding, going well Circumcision prior to discharge Contraception: considering IUD.  Mild anemia of pregnancy, asymptomatic. For iron supplementation postpartum.  Discharge home today as desired.    LOS: 1 day   Hildred Laser, MD [facility] 09/16/2021 2:21 PM

## 2021-09-16 NOTE — Discharge Instructions (Signed)

## 2021-09-16 NOTE — Progress Notes (Signed)
Patient discharged home with family.  Discharge instructions, when to follow up, and prescriptions reviewed with patient.  Patient verbalized understanding. Patient will be escorted out by auxiliary.   

## 2021-09-16 NOTE — Anesthesia Postprocedure Evaluation (Signed)
Anesthesia Post Note  Patient: Breanna Webb  Procedure(s) Performed: AN AD HOC LABOR EPIDURAL  Patient location during evaluation: Mother Baby Anesthesia Type: Epidural Level of consciousness: awake Pain management: pain level controlled Respiratory status: spontaneous breathing Postop Assessment: no headache Anesthetic complications: no   No notable events documented.   Last Vitals:  Vitals:   09/16/21 0019 09/16/21 0300  BP: 116/81 100/75  Pulse: (!) 58 65  Resp: 18 18  Temp: 37.1 C 36.7 C  SpO2: 100% 100%    Last Pain:  Vitals:   09/16/21 0300  TempSrc: Oral  PainSc: 0-No pain                 Jaye Beagle

## 2021-09-16 NOTE — Clinical Social Work Maternal (Signed)
  CLINICAL SOCIAL WORK MATERNAL/CHILD NOTE  Patient Details  Name: Breanna Webb MRN: [id number] Date of Birth: 01-07-1986  Date:  09/16/2021  Clinical Social Worker Initiating Note:  Robbie Lis RN BSN Case Manager Date/Time: Initiated:  09/16/21/1200     Child's Name:  Breanna Webb   Biological Parents:  Mother, Father   Need for Interpreter:  None   Reason for Referral:  Current Substance Use/Substance Use During Pregnancy     Address:  [address] [place] [address]    Phone number:  [phone] (home)     Additional phone number: NA  Household Members/Support Persons (HM/SP):   Household Member/Support Person 1, Household Member/Support Person 2, Household Member/Support Person 3, Household Member/Support Person 4, Household Member/Support Person 5   HM/SP Name Relationship DOB or Age  HM/SP -1 Chase Picket significant other 36  HM/SP -2 Alesama Jerez daughter 43  HM/SP -3 Marsa Aris daughter 64  HM/SP -4 kaison Jeffries son 8  HM/SP -5 Kammie Giles 1 daughter  HM/SP -6        HM/SP -7        HM/SP -8          Natural Supports (not living in the home):  Spouse/significant other   Professional Supports:     Employment: Unemployed   Type of Work:     Education:  Engineer, agricultural   Homebound arranged:    Surveyor, quantity Resources:  Medicaid   Other Resources:  Sales executive  , WIC   Cultural/Religious Considerations Which May Impact Care:  NA  Strengths:  Ability to meet basic needs  , Compliance with medical plan  , Pediatrician chosen, Home prepared for child     Psychotropic Medications:         Pediatrician:    [employer]  Pediatrician List:   [facility] [place]    [place] Other  [facility]      Pediatrician Fax Number:    Risk Factors/Current Problems:  Substance Use     Cognitive State:  Alert  , Able to Concentrate     Mood/Affect:  Happy  , Calm      CSW Assessment: TOC consult for marijuana use during pregnancy.  Patient admits to smoking marijuana as a regular user, daily basis.  Patient denies any other substances, UDS was positive for her and the baby.  Informed patient that CPS report would be made.  Patient verbalizes understanding.  Patient lives at home in [place] at [employer]  She lives with her 4 children.  Her significant other does not live in the home with her and her kids.  She does not work, her significant other provides support he works in [employer].    Patient reports that the home is prepared for the baby, she drives and has reliable transportation.  [facility] be [facility] for pediatrician.    CPS report made, patient will discharge home this evening.    CSW Plan/Description:  Child Protective Service Report      Allayne Butcher, RN 09/16/2021, 5:45 PM

## 2021-09-16 NOTE — Lactation Note (Signed)
This note was copied from a baby's chart. Lactation Consultation Note  Patient Name: Breanna Webb ZSMOL'M Date: 09/16/2021 Reason for consult: Initial assessment;Early term 37-38.6wks;Infant < 6lbs Age:34 hours  Maternal Data Has patient been taught Hand Expression?: Yes Does the patient have breastfeeding experience prior to this delivery?: Yes How long did the patient breastfeed?: 2-3 months  This is mom's 5th baby. She has some breastfeeding experience but states that her milk dries up quickly "within 2-3 months". She did request a pump to be set-up so that she could pump if need be (due to infants size).  Feeding Mother's Current Feeding Choice: Breast Milk  Baby has documented breastfeeds and voids/stools since delivery. 1 formula feeding done.  LATCH Score   Baby did not feed during this visit.  Lactation Tools Discussed/Used Tools: Pump Breast pump type: Double-Electric Breast Pump Reason for Pumping: mom's request Pumping frequency: not often (baby has been latching well so mom has not pumped)  Interventions Interventions: Breast feeding basics reviewed;Hand express;DEBP;Education  Encouraged skin to skin with baby for temp control (size), and also to promote breastfeeding and breast milk supply. Encouraged frequent feeding attempts, hand expression/pump use if baby is sleepy or not interested in feeding.  Discharge Pump: Personal  Consult Status Consult Status: PRN  Encouraged to call out with questions/concerns today.  Mom desires to discharge later today pending 24hr testing and circumcision.  [name] 09/16/2021, 11:12 AM

## 2021-09-17 NOTE — Telephone Encounter (Signed)
Pt was at hospital to deliver.

## 2021-09-22 ENCOUNTER — Encounter: Payer: Medicaid Other | Admitting: Advanced Practice Midwife

## 2021-09-23 LAB — TYPE AND SCREEN
ABO/RH(D): B POS
Antibody Screen: NEGATIVE

## 2021-10-05 ENCOUNTER — Ambulatory Visit: Payer: Medicaid Other | Admitting: Obstetrics & Gynecology

## 2021-10-12 ENCOUNTER — Ambulatory Visit: Payer: Medicaid Other | Admitting: Obstetrics & Gynecology

## 2021-10-14 ENCOUNTER — Ambulatory Visit (INDEPENDENT_AMBULATORY_CARE_PROVIDER_SITE_OTHER): Payer: Medicaid Other | Admitting: Obstetrics & Gynecology

## 2021-10-14 ENCOUNTER — Encounter: Payer: Self-pay | Admitting: Obstetrics & Gynecology

## 2021-10-14 VITALS — BP 112/60 | HR 65 | Resp 16 | Wt 162.8 lb

## 2021-10-14 DIAGNOSIS — Z3042 Encounter for surveillance of injectable contraceptive: Secondary | ICD-10-CM | POA: Insufficient documentation

## 2021-10-14 DIAGNOSIS — R300 Dysuria: Secondary | ICD-10-CM | POA: Diagnosis not present

## 2021-10-14 DIAGNOSIS — N3090 Cystitis, unspecified without hematuria: Secondary | ICD-10-CM | POA: Diagnosis not present

## 2021-10-14 LAB — POCT URINALYSIS DIPSTICK
Bilirubin, UA: NEGATIVE
Blood, UA: NEGATIVE
Glucose, UA: NEGATIVE
Ketones, UA: NEGATIVE
Protein, UA: NEGATIVE
Spec Grav, UA: 1.02 (ref 1.010–1.025)
Urobilinogen, UA: 0.2 E.U./dL
pH, UA: 6 (ref 5.0–8.0)

## 2021-10-14 MED ORDER — MEDROXYPROGESTERONE ACETATE 150 MG/ML IM SUSP
150.0000 mg | Freq: Once | INTRAMUSCULAR | Status: AC
  Administered 2021-10-14: 150 mg via INTRAMUSCULAR

## 2021-10-14 MED ORDER — AMOXICILLIN 500 MG PO CAPS: 500.0000 mg | ORAL_CAPSULE | Freq: Three times a day (TID) | ORAL | 0 refills | Status: DC

## 2021-10-14 NOTE — Assessment & Plan Note (Signed)
Q48mos Depo

## 2021-10-16 LAB — URINE CULTURE

## 2021-10-26 ENCOUNTER — Ambulatory Visit: Payer: Medicaid Other | Admitting: Obstetrics & Gynecology

## 2021-10-28 ENCOUNTER — Ambulatory Visit (INDEPENDENT_AMBULATORY_CARE_PROVIDER_SITE_OTHER): Payer: Medicaid Other | Admitting: Obstetrics & Gynecology

## 2021-10-28 ENCOUNTER — Encounter: Payer: Self-pay | Admitting: Obstetrics & Gynecology

## 2021-10-28 NOTE — Progress Notes (Signed)
Subjective:     Breanna Webb is a 36 y.o. female who presents for a postpartum visit. She is 6 weeks postpartum following a spontaneous vaginal delivery. I have fully reviewed the prenatal and intrapartum course. The delivery was at 38 3/[redacted] wks gestational weeks. Outcome: spontaneous vaginal delivery. Anesthesia: epidural. Postpartum course has been uncomplicated. Baby's course has been uncomplicated. Baby is feeding by both breast and bottle - unknown . Bleeding moderate lochia. Bowel function is normal. Bladder function is normal. Patient is not sexually active. Contraception method is abstinence. Postpartum depression screening: negative.  The following portions of the patient's history were reviewed and updated as appropriate: allergies, current medications, past family history, past medical history, past social history, past surgical history, and problem list.  Review of Systems A comprehensive review of systems was negative.   Objective:    BP 122/82   Ht 5' (1.524 m)   Wt 166 lb (75.3 kg)   LMP 10/11/2021 (Exact Date)   Breastfeeding Yes   BMI 32.42 kg/m   General:  alert, cooperative, and no distress   Breasts:  inspection negative, no nipple discharge or bleeding, no masses or nodularity palpable  Lungs: Nl effort  Heart:  Normal rate  Abdomen: normal findings: no masses palpable and soft, non-tender   Vulva:  not evaluated  Vagina: not evaluated  Cervix:  Not eval  Corpus: not examined  Adnexa:  not evaluated  Rectal Exam: Not performed.        Assessment:    nl postpartum exam. Pap smear not done at today's visit.   Plan:    1. Contraception: Depo-Provera injections 2. Pap at next visit 3. Follow up in: 3 months or as needed.   Linzie Collin, MD  10/28/2021 11:28 AM

## 2021-11-02 ENCOUNTER — Encounter: Payer: Self-pay | Admitting: Obstetrics & Gynecology

## 2021-11-02 NOTE — Progress Notes (Signed)
Subjective:     Breanna Webb is a 36 y.o. female who presents for a postpartum visit. She is 4 weeks postpartum following a spontaneous vaginal delivery. I have fully reviewed the prenatal and intrapartum course. The delivery was at 38 3/7 gestational weeks. Outcome: spontaneous vaginal delivery. Anesthesia: epidural. Postpartum course has been uncomplicated. Baby's course has been uncomplicated. Baby is feeding by breast. Bleeding moderate lochia. Bowel function is normal. Bladder function is normal. Patient is not sexually active. Contraception method is OCP (estrogen/progesterone). Postpartum depression screening: negative.  The following portions of the patient's history were reviewed and updated as appropriate: allergies, current medications, past family history, past medical history, past social history, past surgical history, and problem list.  Review of Systems A comprehensive review of systems was negative.   Objective:    BP 112/60   Pulse 65   Resp 16   Wt 162 lb 12.8 oz (73.8 kg)   LMP 12/20/2020 (Exact Date)   SpO2 95%   Breastfeeding Yes   BMI 31.79 kg/m   General:  alert   Breasts:  inspection negative, no nipple discharge or bleeding, no masses or nodularity palpable  Lungs: clear to auscultation bilaterally  Heart:  regular rate and rhythm  Abdomen: normal findings: no organomegaly and soft, non-tender   Vulva:  normal  Vagina: not evaluated  Cervix:  Not evaluated  Corpus: not examined  Adnexa:  not evaluated           Assessment:    4 weeks postpartum exam. Pap smear not done at today's visit.  PAP @ next visit  1. Contraception: Depo-Provera injections 2. Pt switch from OCP to Depo 3.F/U for Depo repeat as scheduled  Linzie Collin, MD  11/03/2021 12:41 AM

## 2022-01-06 ENCOUNTER — Ambulatory Visit: Payer: Medicaid Other

## 2022-01-06 ENCOUNTER — Ambulatory Visit (INDEPENDENT_AMBULATORY_CARE_PROVIDER_SITE_OTHER): Payer: Medicaid Other

## 2022-01-06 VITALS — BP 107/75 | HR 67 | Wt 167.0 lb

## 2022-01-06 DIAGNOSIS — Z3042 Encounter for surveillance of injectable contraceptive: Secondary | ICD-10-CM | POA: Diagnosis not present

## 2022-01-06 MED ORDER — MEDROXYPROGESTERONE ACETATE 150 MG/ML IM SUSP
150.0000 mg | Freq: Once | INTRAMUSCULAR | Status: AC
  Administered 2022-01-06: 150 mg via INTRAMUSCULAR

## 2022-01-06 NOTE — Progress Notes (Signed)
Last Depo-Provera: 10/28/2021 Side Effects if any: N/A Serum HCG indicated? N/A Depo-Provera 150 mg IM given by: Doristine Devoid, CMA Next appointment due January 24- Fed 7.   NDC: 49611-643-53 Given: Para Skeans

## 2022-03-24 ENCOUNTER — Ambulatory Visit (INDEPENDENT_AMBULATORY_CARE_PROVIDER_SITE_OTHER): Payer: Medicaid Other

## 2022-03-24 VITALS — BP 110/84 | Wt 155.7 lb

## 2022-03-24 DIAGNOSIS — Z3042 Encounter for surveillance of injectable contraceptive: Secondary | ICD-10-CM

## 2022-03-24 MED ORDER — MEDROXYPROGESTERONE ACETATE 150 MG/ML IM SUSP
150.0000 mg | Freq: Once | INTRAMUSCULAR | Status: AC
  Administered 2022-03-24: 150 mg via INTRAMUSCULAR

## 2022-03-24 NOTE — Progress Notes (Signed)
    NURSE VISIT NOTE  Subjective:    Patient ID: Janiqua Friscia, female    DOB: [birth year], 37 y.o.   MRN: [id number]  HPI  Patient is a 37 y.o. [id number] female who presents for depo provera injection.   Objective:    BP 110/84 (BP Location: Left Arm, Patient Position: Sitting, Cuff Size: Normal)   Wt 155 lb 11.2 oz (70.6 kg)   BMI 30.41 kg/m   Last Annual: 10/28/2021. Last pap: 02/24/2021. Last Depo-Provera: 01/06/2022. Side Effects if any: none. Serum HCG indicated? No . Depo-Provera 150 mg IM given by: Cleophas Dunker, CMA. Site: Right Upper Outer Quandrant  Lab Review  @THIS  VISIT ONLY@  Assessment:   1. Encounter for surveillance of injectable contraceptive      Plan:   Next appointment due between 06/09/2022 and 06/23/2022.    Cleophas Dunker, CMA

## 2022-04-14 IMAGING — US US OB COMP +14 WK
1 series · 15 of 28 positions shown · non-contrast
Comparison: none

CLINICAL DATA: Pregnancy.  Assess anatomy.

EXAM:
OBSTETRICAL ULTRASOUND >14 WKS

[Series 1: us ob comp + 14 wk · 15 of 91 slices shown]
[im 1/91]
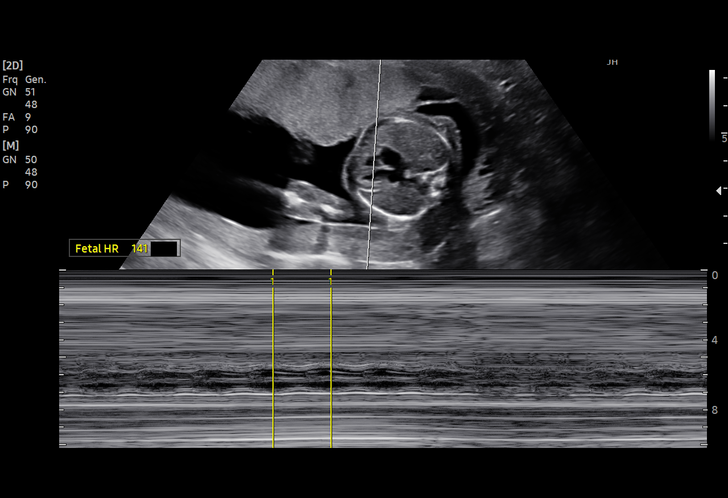
[im 7/91]
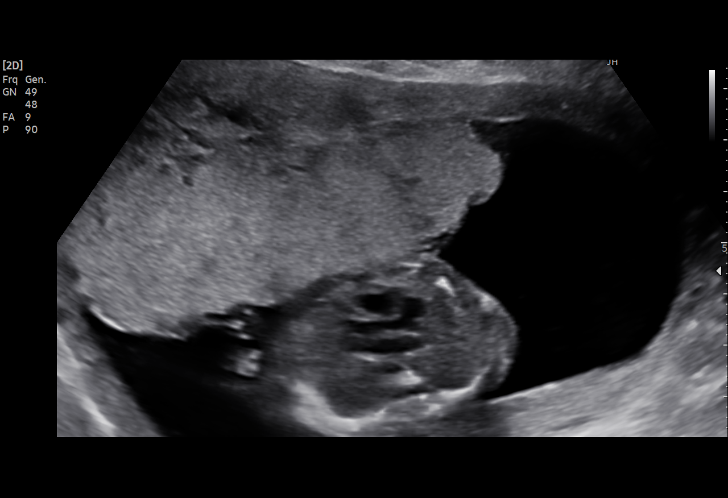
[im 14/91]
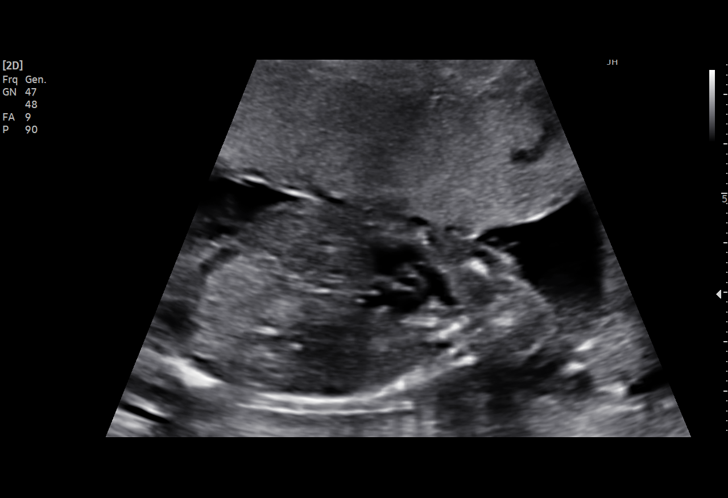
[im 21/91]
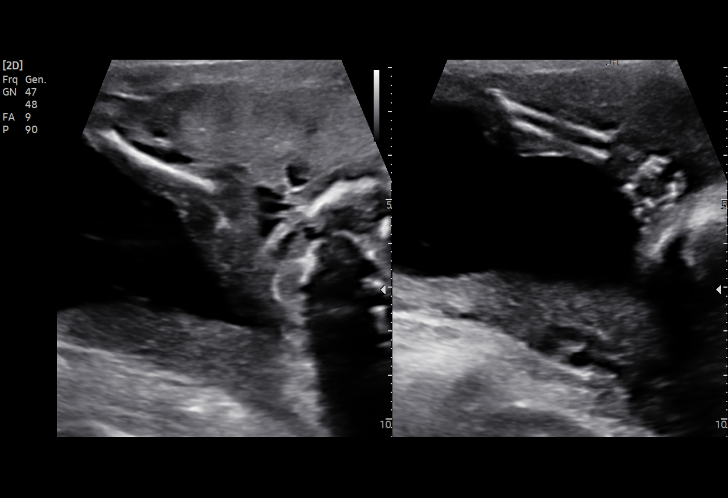
[im 27/91]
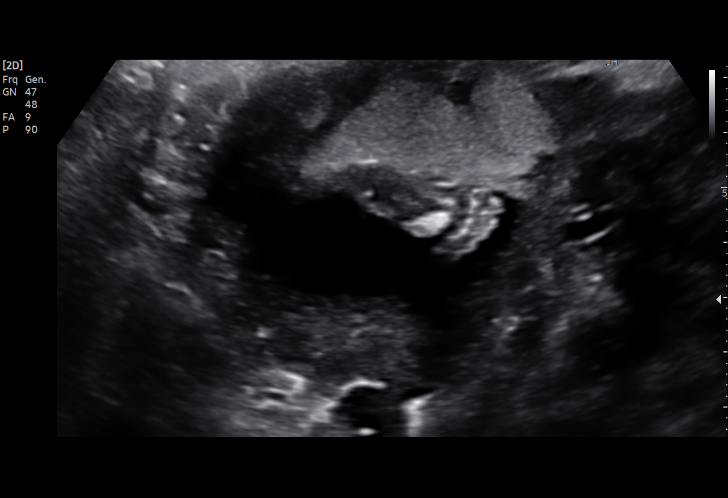
[im 34/91]
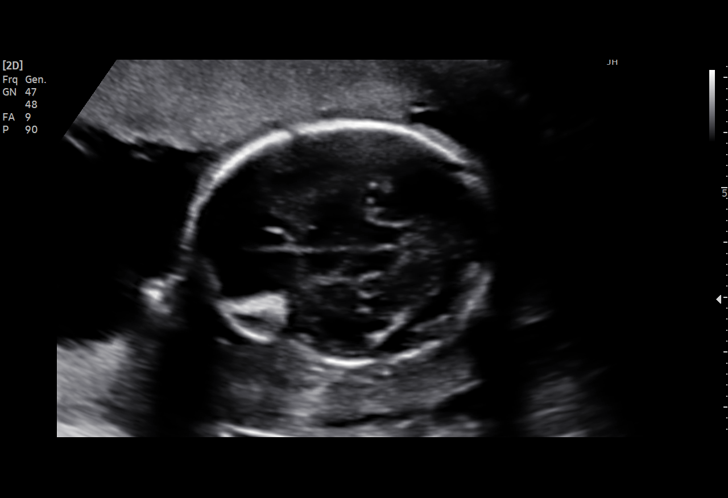
[im 41/91]
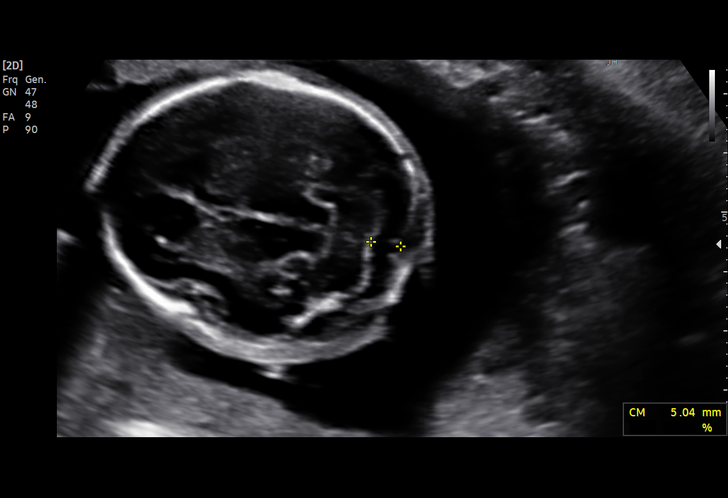
[im 47/91]
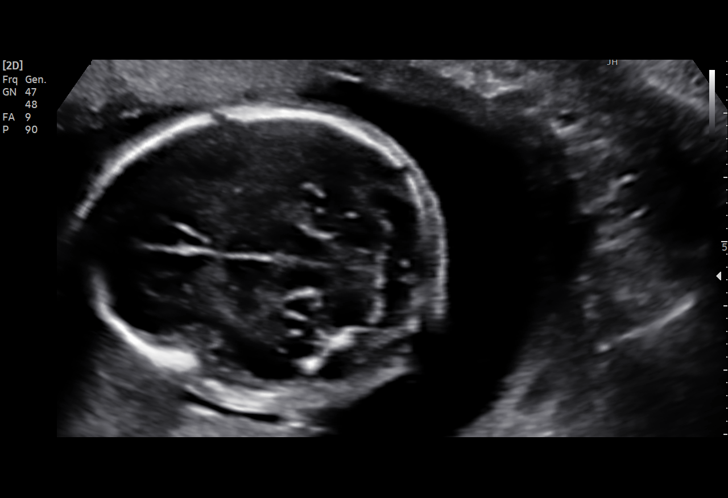
[im 51/91]
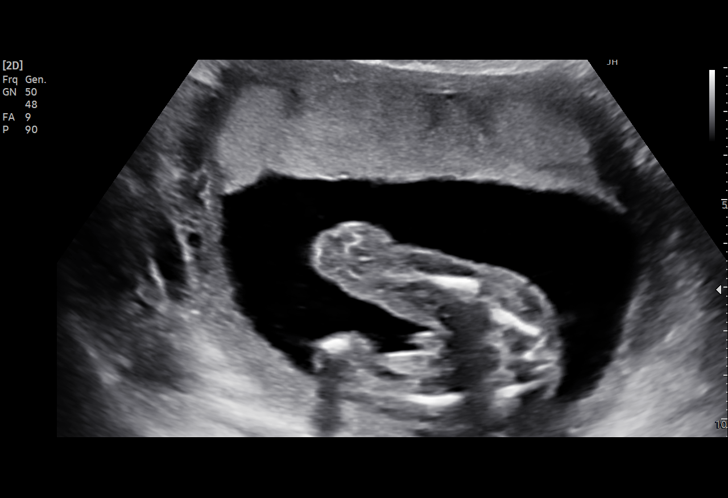
[im 57/91]
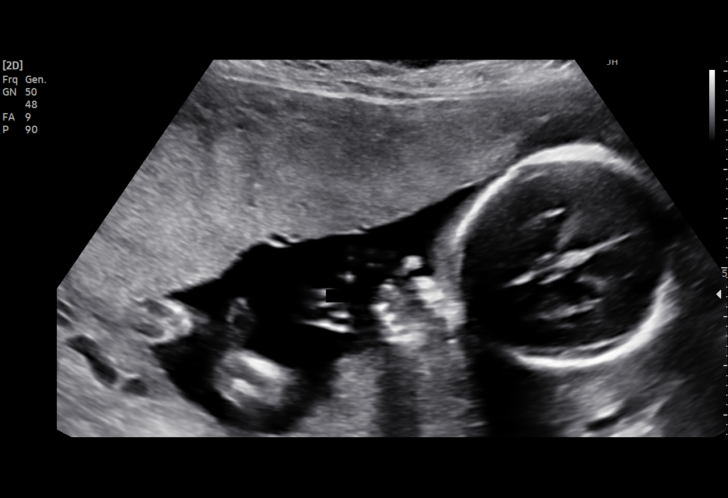
[im 64/91]
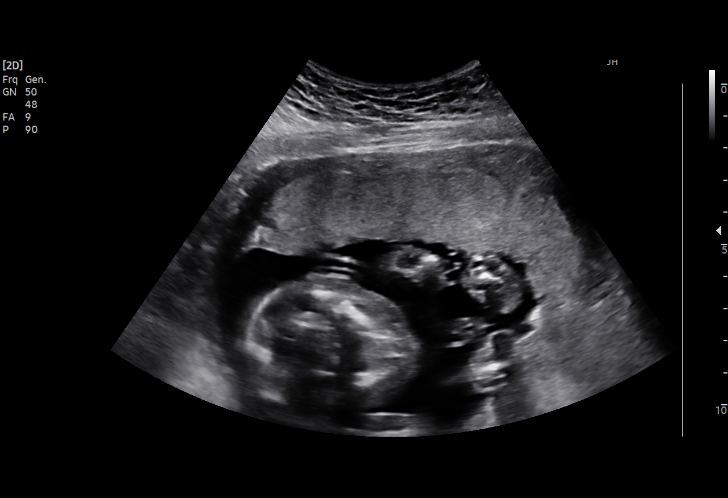
[im 71/91]
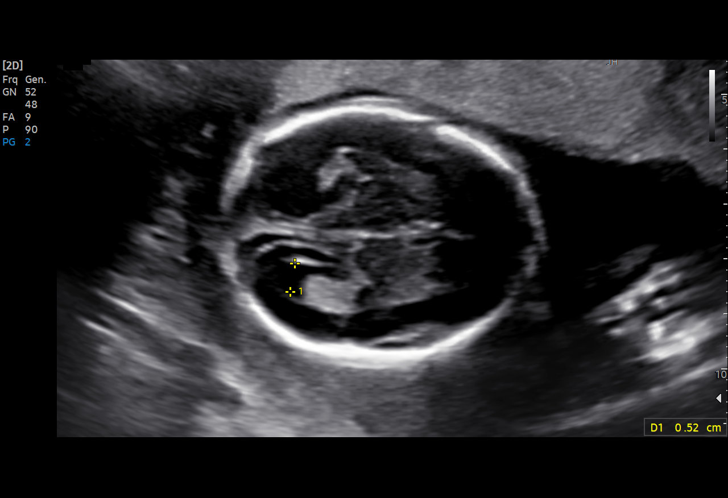
[im 77/91]
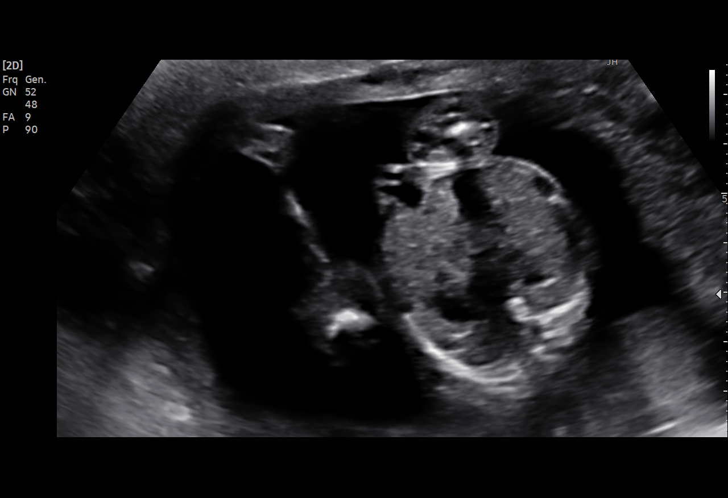
[im 84/91]
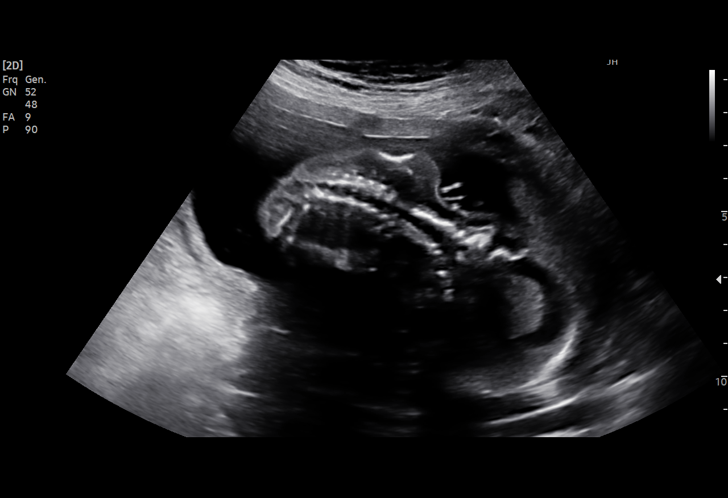
[im 91/91]
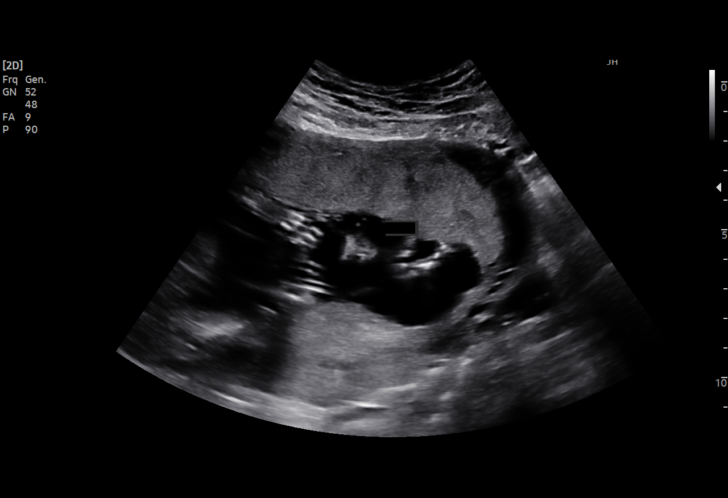

[15 of 28 positions shown; findings below may reference images not displayed]

FINDINGS: Number of Fetuses: 1

Heart Rate:  141 bpm

Movement: Yes

Presentation: Breech

Previa: No

Placental Location: Anterior

Amniotic Fluid (Subjective): Normal

Amniotic Fluid (Objective):

Vertical pocket = 4.0cm

FETAL BIOMETRY

BPD: 4.5cm 19w 3d

HC:   16.8cm 19w 3d

AC:   13.9cm 19w 2d

FL:   2.9cm 18w 6d

Current Mean GA: 19w 1d US EDC: 09/27/2021

Assigned GA:  19w 2d Assigned EDC: 09/26/2021

Estimated Fetal Weight:  276g

FETAL ANATOMY

Lateral Ventricles: Appears normal

Thalami/CSP: Appears normal

Posterior Fossa:  Appears normal

Nuchal Region: Appears normal   NFT= 4 mm

Upper Lip: Appears normal

Spine: Not well visualized

4 Chamber Heart on Left: Appears normal

LVOT: Appears normal

RVOT: Not visualized

Stomach on Left: Appears normal

3 Vessel Cord: Appears normal

Cord Insertion site: Appears normal

Kidneys: Appears normal

Bladder: Appears normal

Extremities: Appears normal

Sex: Male

Technically difficult due to: Fetal positioning

Maternal Findings:

Cervix:  Closed.  4.8 cm.
IMPRESSION: 1. Single live intrauterine gestation in breech presentation.
2. Assigned gestational age of 19 weeks 2 days. Adequate interval
growth.
3. Limited visualization of the fetal spine and right ventricular
outflow tract. Attention on follow-up.
4. Remainder of the fetal anatomic survey is within normal limits.

## 2022-04-30 IMAGING — US US OB FOLLOW-UP
1 series · 13 of 28 positions shown · non-contrast
Comparison: none

CLINICAL DATA: Incomplete fetal anatomic survey

EXAM:
OBSTETRIC 14+ WK ULTRASOUND FOLLOW-UP

[Series 1: us ob follow up · 13 of 72 slices shown]
[im 3/72]
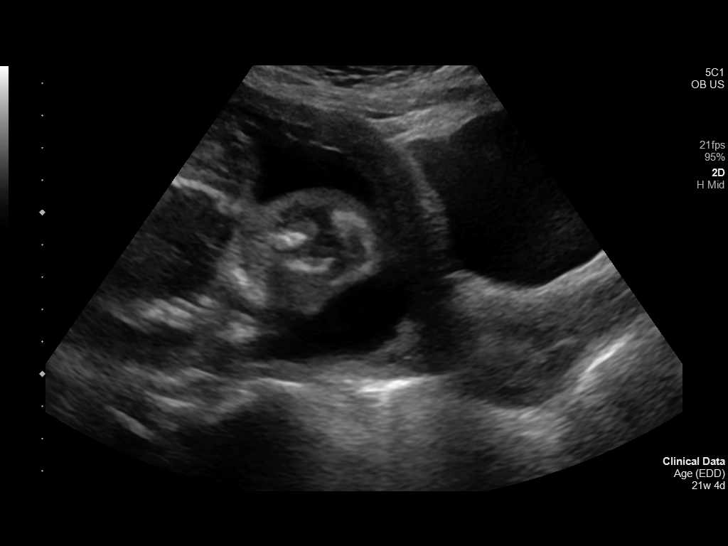
[im 8/72]
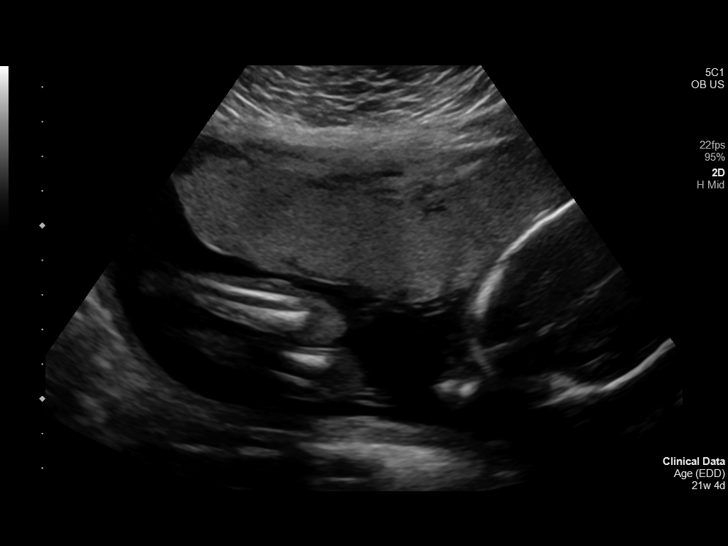
[im 14/72]
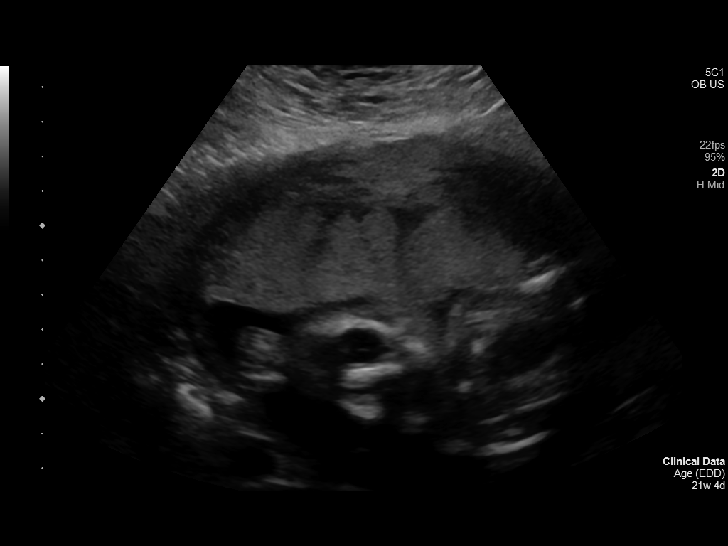
[im 19/72]
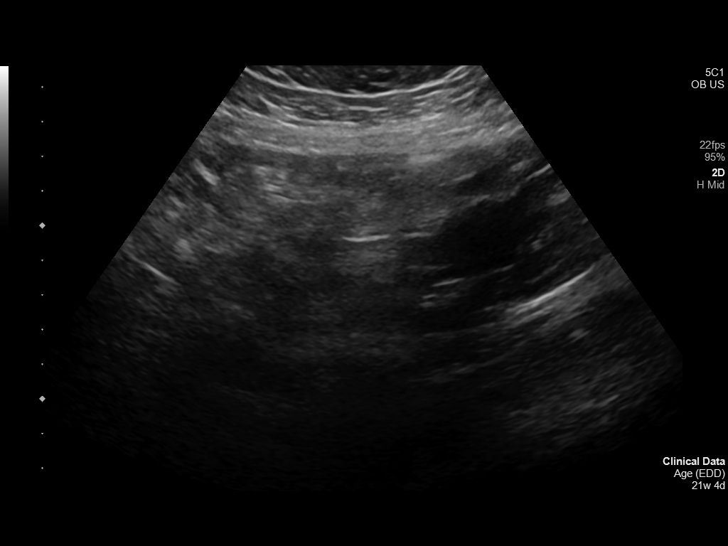
[im 24/72]
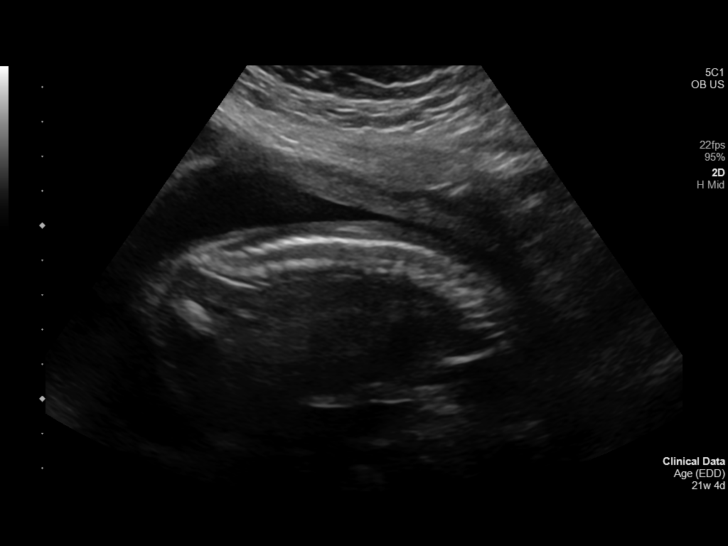
[im 29/72]
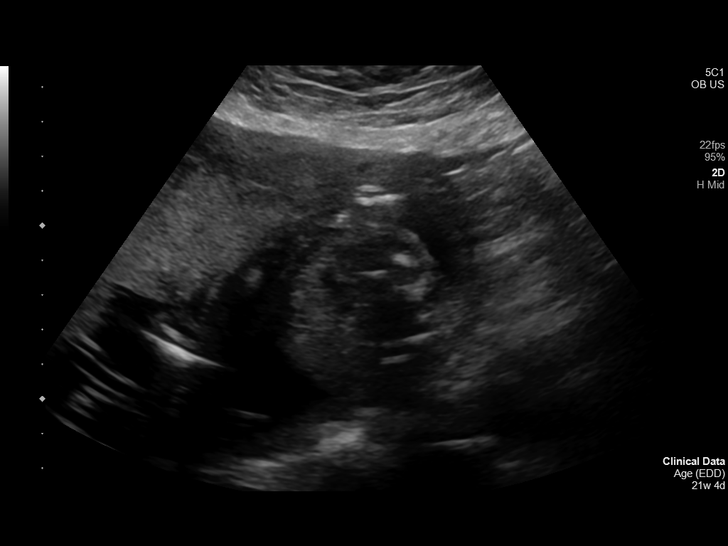
[im 37/72]
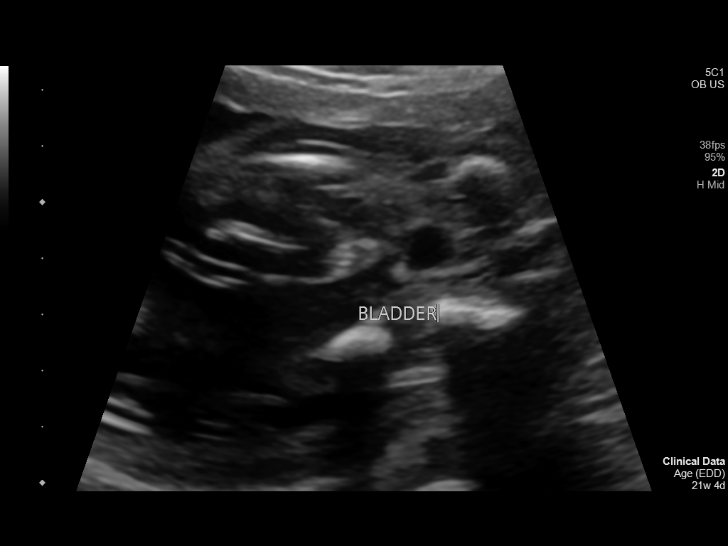
[im 43/72]
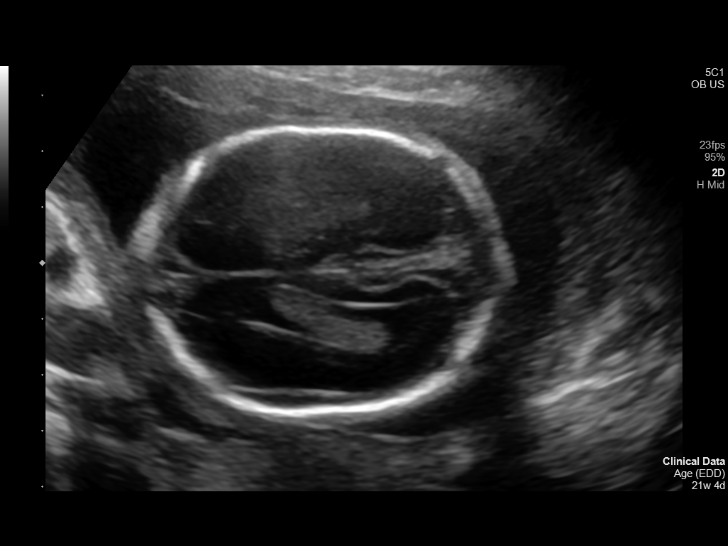
[im 48/72]
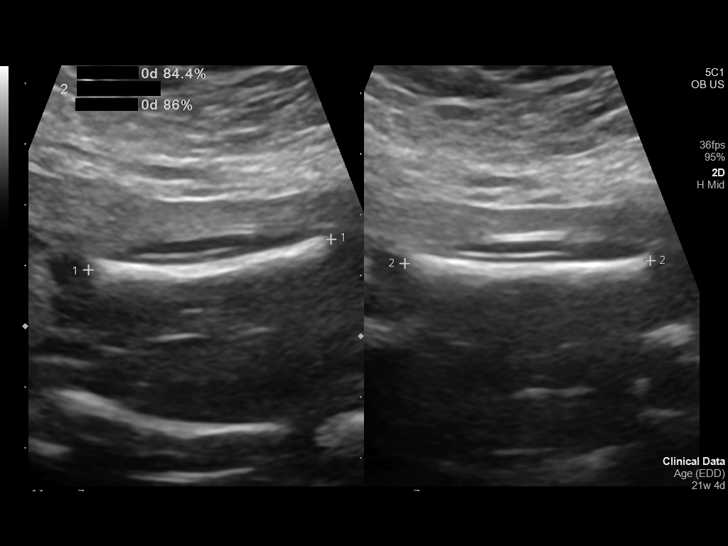
[im 53/72]
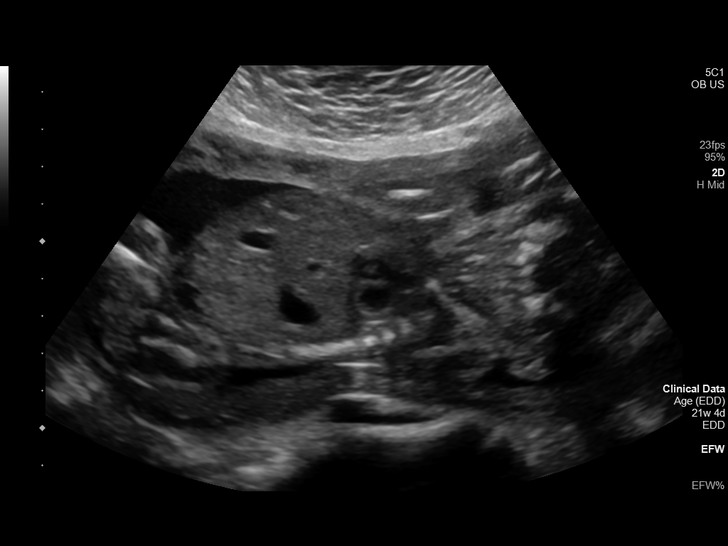
[im 58/72]
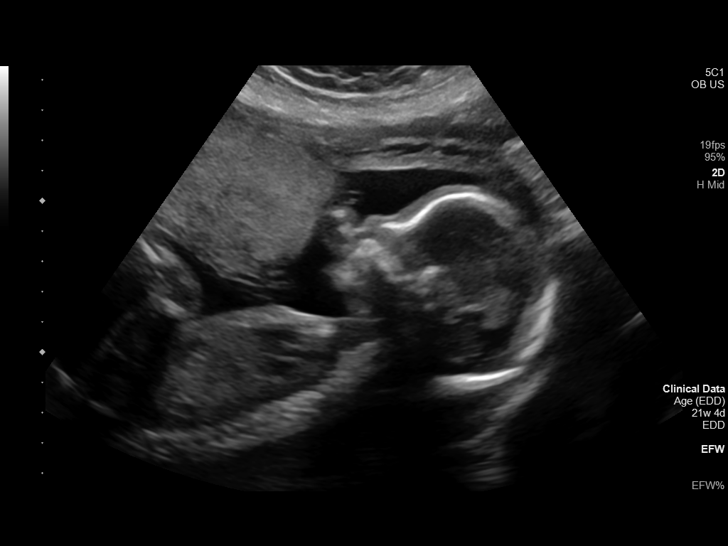
[im 64/72]
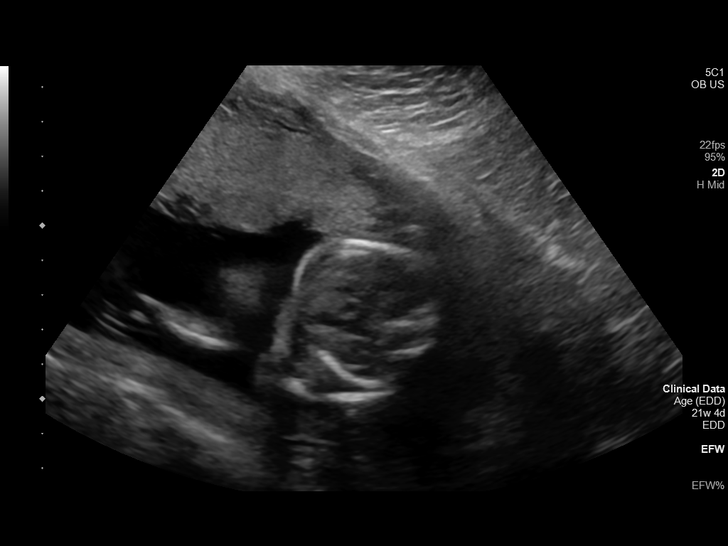
[im 69/72]
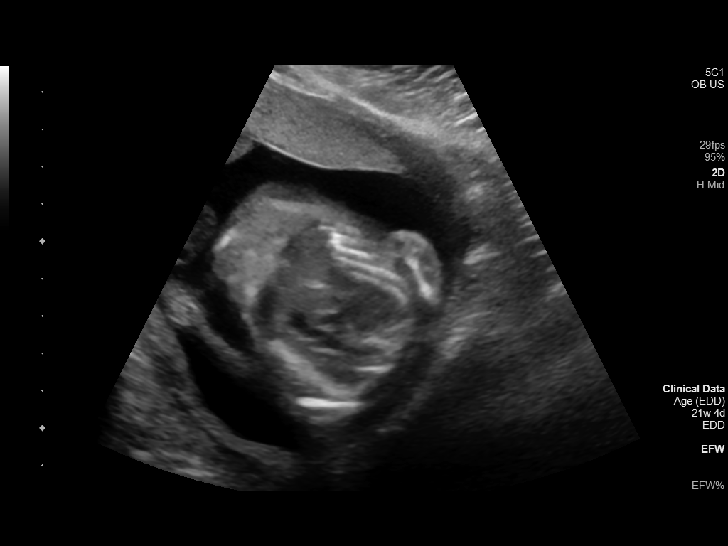

[13 of 28 positions shown; findings below may reference images not displayed]

FINDINGS: Number of Fetuses: 1

Heart Rate:  155 bpm

Movement: Yes

Presentation: Variable

Previa: No

Placental Location: Anterior

Amniotic Fluid (Subjective): Normal

Amniotic Fluid (Objective):

Vertical pocket 5.7cm

FETAL BIOMETRY

BPD:  5.2cm 21w 5d

HC:    19.6cm 21w 6d

AC:    16.9cm 22w 0d

FL:    4.0cm 22w 5d

Current Mean GA: 22w 1d US EDC: 09/22/2021

Assigned GA: 21w 4d Assigned EDC: 09/26/2021

FETAL ANATOMY

Lateral Ventricles: Appears normal

Thalami/CSP: Appears normal

Posterior Fossa: Previously seen

Nuchal Region: Previously seen

Upper Lip: Previously seen

Spine: Appears normal

4 Chamber Heart on Left: Appears normal

LVOT: Appears normal

RVOT: Appears normal

Stomach on Left: Appears normal

3 Vessel Cord: Appears normal

Cord Insertion site: Previously seen

Kidneys: Appears normal

Bladder: Appears normal

Extremities: Previously seen

Sex: Previously Seen

Technical Limitations: Fetal position

Maternal Findings:

Cervix:  4.2 cm, closed
IMPRESSION: 1. Single live intrauterine pregnancy as above, estimated age 22
weeks and 1 day.
2. Normal appearance of the fetal spine and RV OT, completing the
anatomic survey begun previously. No fetal anomalies identified.

## 2022-06-16 ENCOUNTER — Ambulatory Visit (INDEPENDENT_AMBULATORY_CARE_PROVIDER_SITE_OTHER): Payer: Medicaid Other

## 2022-06-16 VITALS — BP 114/79 | HR 80 | Ht 60.0 in | Wt 150.0 lb

## 2022-06-16 DIAGNOSIS — Z3042 Encounter for surveillance of injectable contraceptive: Secondary | ICD-10-CM

## 2022-06-16 MED ORDER — MEDROXYPROGESTERONE ACETATE 150 MG/ML IM SUSY
150.0000 mg | PREFILLED_SYRINGE | Freq: Once | INTRAMUSCULAR | Status: AC
  Administered 2022-06-16: 150 mg via INTRAMUSCULAR

## 2022-06-16 NOTE — Progress Notes (Signed)
    NURSE VISIT NOTE  Subjective:    Patient ID: Breanna Webb, female    DOB: [birth year], 37 y.o.   MRN: [id number]  HPI  Patient is a 37 y.o. [id number] female who presents for depo provera injection.   Objective:    Wt 150 lb (68 kg)   BMI 29.29 kg/m   Last Annual:DUE advised to schedule at check  Last pap: 02/24/2021 abnormal pap HPV . Last Depo-Provera: 03/24/22. Side Effects if any: none. Serum HCG indicated? No . Depo-Provera 150 mg IM given by: Beverely Pace, CMA. Site: Right Ventrogluteal   Assessment:   1. Encounter for management and injection of depo-Provera      Plan:   Next appointment due between July 3 and 17.    Loney Laurence, CMA

## 2022-07-31 IMAGING — US US OB FOLLOW-UP
1 series · 13 of 28 positions shown · non-contrast
Comparison: none

CLINICAL DATA: Obstetric follow-up ultrasound

EXAM:
OBSTETRICAL ULTRASOUND >14 WKS AND BIOPHYSICAL PROFILE

[Series 1: ob · 13 of 67 slices shown]
[im 3/67]
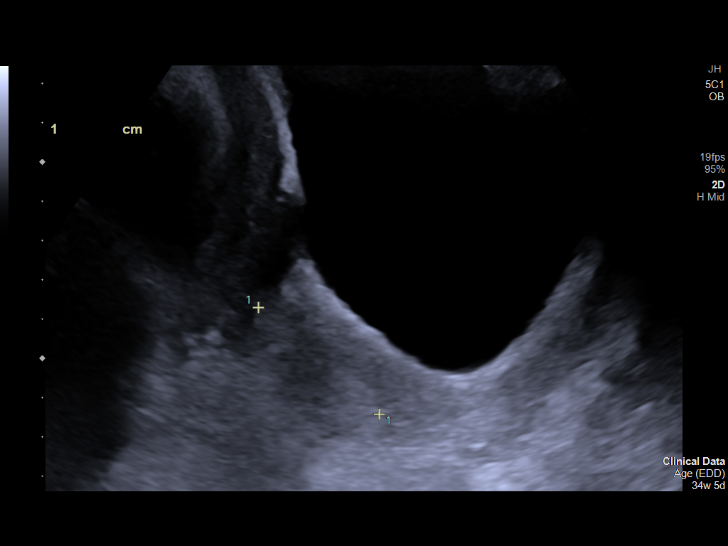
[im 8/67]
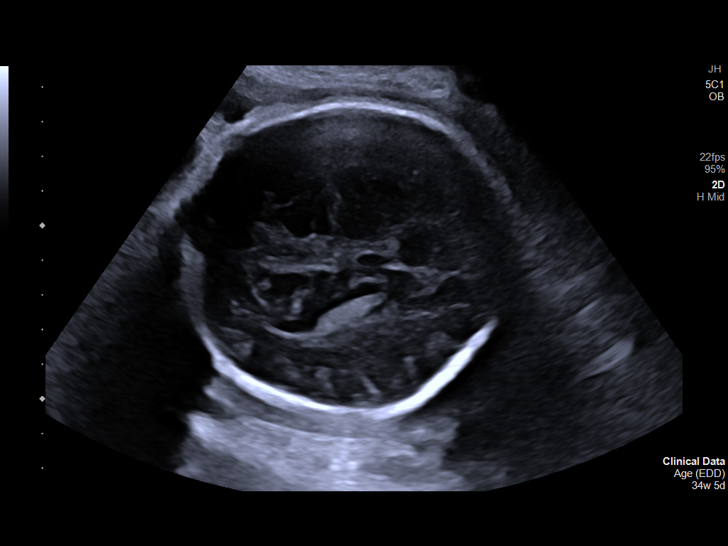
[im 13/67]
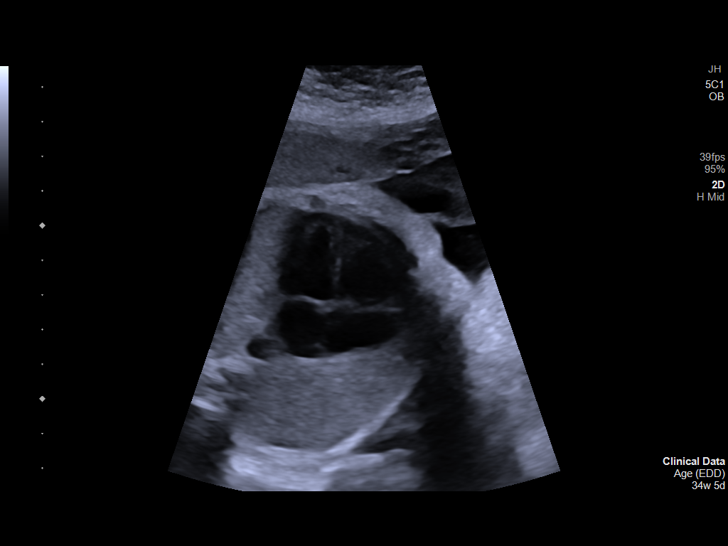
[im 18/67]
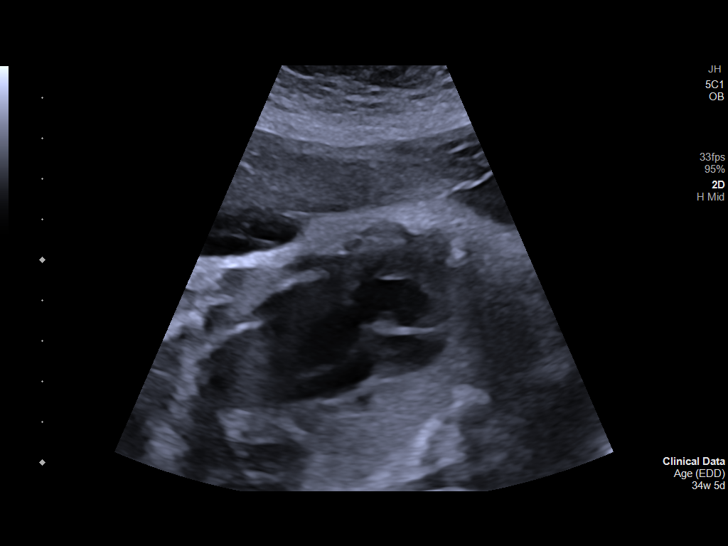
[im 23/67]
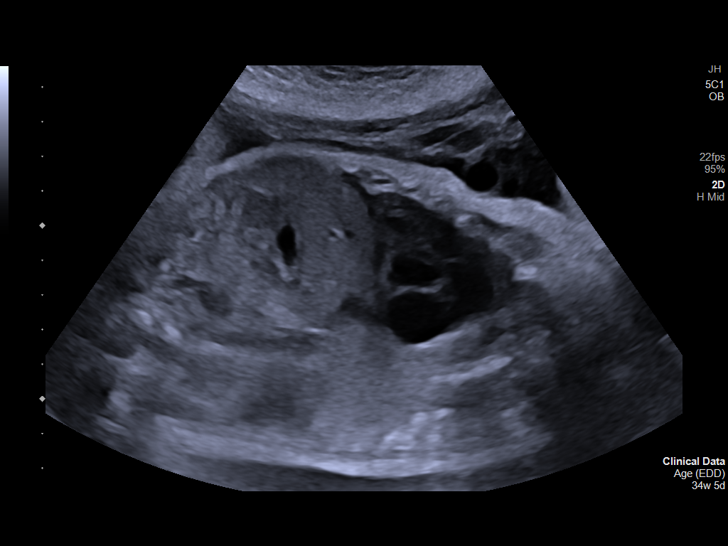
[im 27/67]
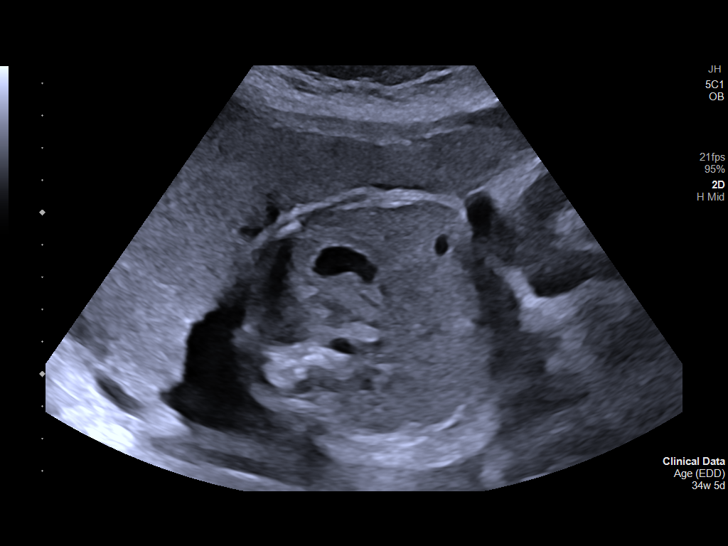
[im 35/67]
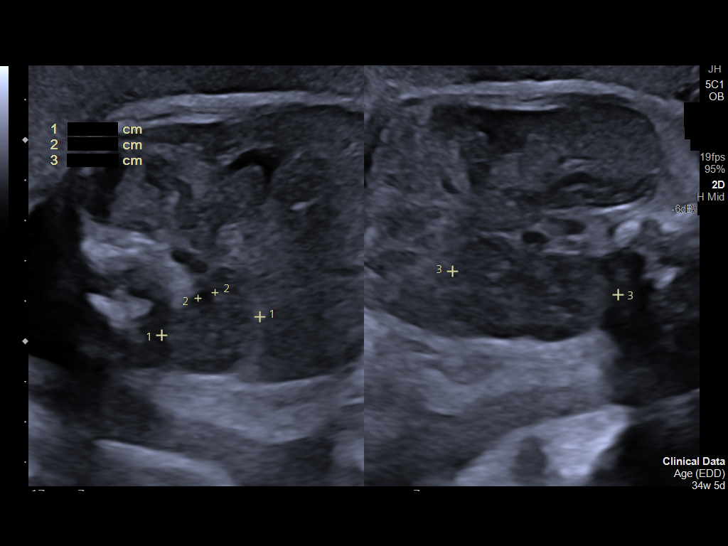
[im 40/67]
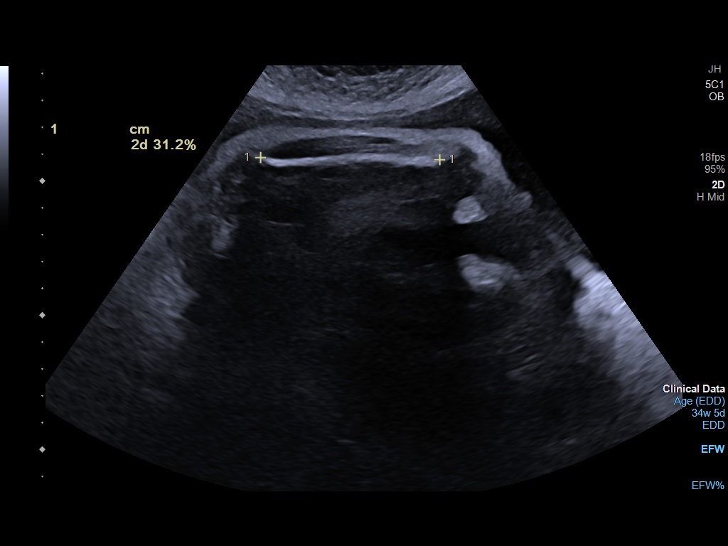
[im 45/67]
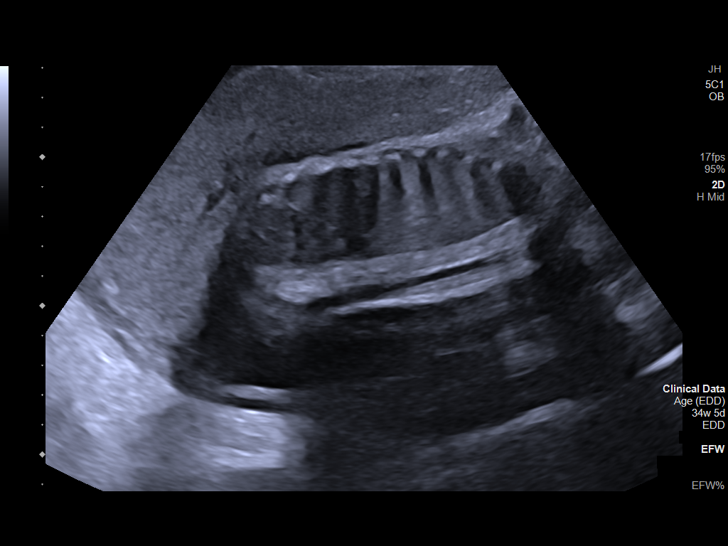
[im 49/67]
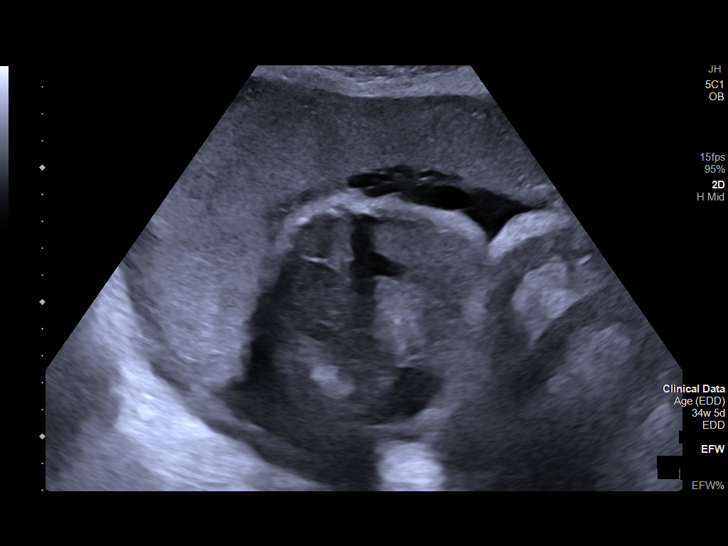
[im 54/67]
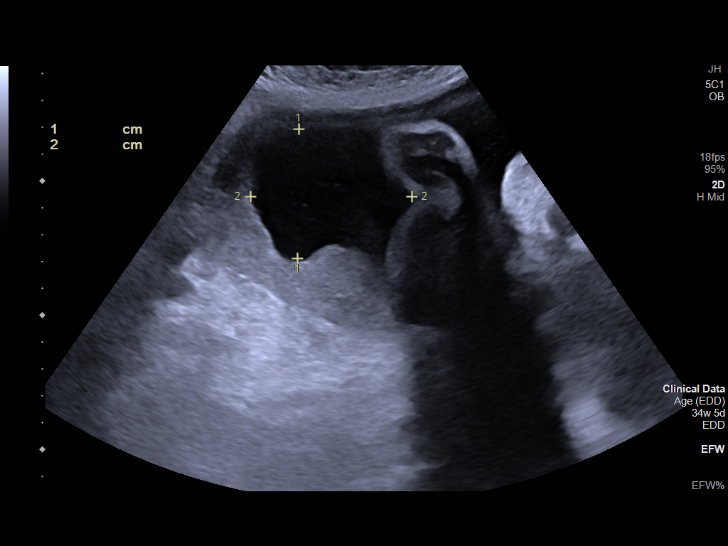
[im 59/67]
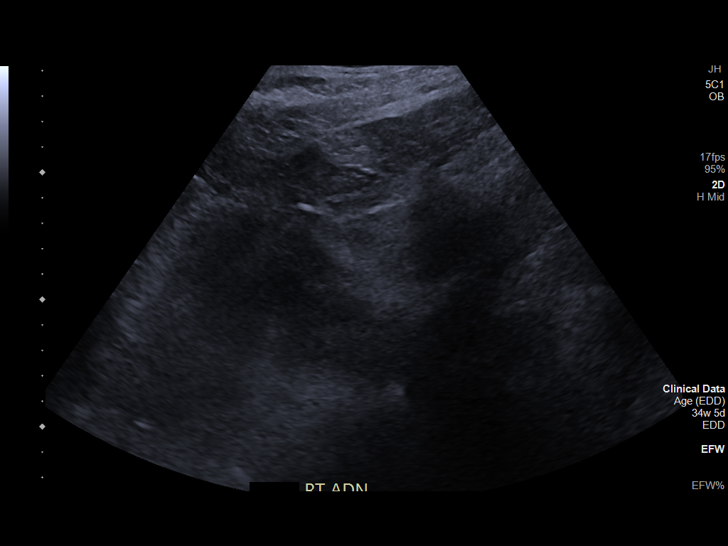
[im 64/67]
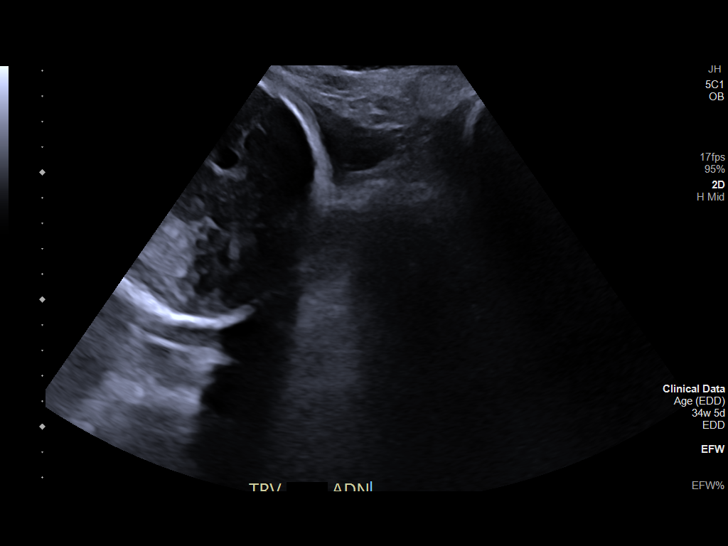

[13 of 28 positions shown; findings below may reference images not displayed]

FINDINGS: Number of Fetuses: 1

Heart Rate:  136 bpm

Movement: Yes

Presentation: Cephalic

Previa: No

Placental Location: Anterior

Amniotic Fluid (Subjective): Normal

Amniotic Fluid (Objective):

Vertical pocket 6.4cm

AFI 19.2 cm (50-95 percentile for 34 wks)

FETAL BIOMETRY

BPD:  8.8cm 35w 5d

HC:    31.1cm 34w 5d

AC:   30.6cm 34Yesmin Vest

HOBER:   6.8cm 34w 6d

Current Mean GA: 35w 0d US EDC: 09/24/2021

Assigned GA: 34w 4d Assigned EDC: 09/26/2021

Estimated Fetal Weight:  2,512g 47%ile

FETAL ANATOMY

Lateral Ventricles: Appears normal

Thalami/CSP: Previously seen

Posterior Fossa: Previously seen

Nuchal Region: Previously seen    NFT= N/A > 20 WKS

Upper Lip: Previously seen

Spine: Previously seen

4 Chamber Heart on Left: Appears normal

LVOT: Appears normal

RVOT: Appears normal

Stomach on Left: Appears normal

3 Vessel Cord: Appears normal

Cord Insertion site: Previously seen

Kidneys: Appears normal

Bladder: Appears normal

Extremities: Previously seen

Sex: Previously Seen

Technical Limitations: Advanced gestational age

Maternal Findings:

Cervix:  Closed measuring 4.1 cm
IMPRESSION: 1. Single live intrauterine gestation with approximate gestational
age of 35 weeks and 0 days, EDC based on today's sonogram is
09/24/2021.
2. Normal anatomical survey.

## 2022-09-06 ENCOUNTER — Ambulatory Visit (INDEPENDENT_AMBULATORY_CARE_PROVIDER_SITE_OTHER): Payer: Medicaid Other

## 2022-09-06 VITALS — BP 126/86 | HR 66 | Ht 60.0 in | Wt 144.0 lb

## 2022-09-06 DIAGNOSIS — Z3042 Encounter for surveillance of injectable contraceptive: Secondary | ICD-10-CM | POA: Diagnosis not present

## 2022-09-06 MED ORDER — MEDROXYPROGESTERONE ACETATE 150 MG/ML IM SUSY
150.0000 mg | PREFILLED_SYRINGE | Freq: Once | INTRAMUSCULAR | Status: AC
  Administered 2022-09-06: 150 mg via INTRAMUSCULAR

## 2022-09-06 NOTE — Progress Notes (Signed)
    NURSE VISIT NOTE  Subjective:    Patient ID: Breanna Webb, female    DOB: 22-Jul-1985, 37 y.o.   MRN: [id number]  HPI  Patient is a 37 y.o. [id number] female who presents for depo provera injection.   Objective:    BP 126/86   Pulse 66   Wt 144 lb (65.3 kg)   BMI 28.12 kg/m   Last Annual: DUE advised to schedule at check . Last pap: 02/24/21 Last Depo-Provera: 06/16/22. Side Effects if any: none. Serum HCG indicated? No . Depo-Provera 150 mg IM given by: Beverely Pace, CMA. Site: Left Ventrogluteal   Assessment:   1. Encounter for Depo-Provera contraception      Plan:    Next appointment due between SEPT-23 and OCT-7.   Loney Laurence, CMA

## 2022-11-29 ENCOUNTER — Ambulatory Visit: Payer: Medicaid Other | Admitting: Obstetrics

## 2022-11-29 ENCOUNTER — Ambulatory Visit: Payer: Medicaid Other

## 2022-11-29 DIAGNOSIS — H5213 Myopia, bilateral: Secondary | ICD-10-CM | POA: Diagnosis not present

## 2022-12-08 ENCOUNTER — Ambulatory Visit: Payer: Medicaid Other | Admitting: Certified Nurse Midwife

## 2022-12-16 ENCOUNTER — Ambulatory Visit: Payer: Medicaid Other | Admitting: Certified Nurse Midwife

## 2022-12-16 ENCOUNTER — Encounter: Payer: Self-pay | Admitting: Certified Nurse Midwife

## 2022-12-16 ENCOUNTER — Other Ambulatory Visit (HOSPITAL_COMMUNITY)
Admission: RE | Admit: 2022-12-16 | Discharge: 2022-12-16 | Disposition: A | Discharge status: Home / Self Care | Payer: Medicaid Other | Source: Ambulatory Visit | Attending: Obstetrics & Gynecology | Admitting: Obstetrics & Gynecology

## 2022-12-16 VITALS — BP 107/73 | HR 83 | Ht 60.0 in | Wt 152.0 lb

## 2022-12-16 DIAGNOSIS — Z131 Encounter for screening for diabetes mellitus: Secondary | ICD-10-CM

## 2022-12-16 DIAGNOSIS — Z113 Encounter for screening for infections with a predominantly sexual mode of transmission: Secondary | ICD-10-CM

## 2022-12-16 DIAGNOSIS — Z3042 Encounter for surveillance of injectable contraceptive: Secondary | ICD-10-CM

## 2022-12-16 DIAGNOSIS — Z1329 Encounter for screening for other suspected endocrine disorder: Secondary | ICD-10-CM

## 2022-12-16 DIAGNOSIS — Z3202 Encounter for pregnancy test, result negative: Secondary | ICD-10-CM | POA: Diagnosis not present

## 2022-12-16 DIAGNOSIS — Z124 Encounter for screening for malignant neoplasm of cervix: Secondary | ICD-10-CM | POA: Diagnosis present

## 2022-12-16 DIAGNOSIS — Z8619 Personal history of other infectious and parasitic diseases: Secondary | ICD-10-CM

## 2022-12-16 DIAGNOSIS — Z01419 Encounter for gynecological examination (general) (routine) without abnormal findings: Secondary | ICD-10-CM | POA: Insufficient documentation

## 2022-12-16 DIAGNOSIS — Z1151 Encounter for screening for human papillomavirus (HPV): Secondary | ICD-10-CM | POA: Diagnosis present

## 2022-12-16 DIAGNOSIS — Z0000 Encounter for general adult medical examination without abnormal findings: Secondary | ICD-10-CM

## 2022-12-16 DIAGNOSIS — Z114 Encounter for screening for human immunodeficiency virus [HIV]: Secondary | ICD-10-CM | POA: Diagnosis not present

## 2022-12-16 LAB — POCT URINE PREGNANCY: Preg Test, Ur: NEGATIVE

## 2022-12-16 MED ORDER — MEDROXYPROGESTERONE ACETATE 150 MG/ML IM SUSY
150.0000 mg | PREFILLED_SYRINGE | Freq: Once | INTRAMUSCULAR | Status: AC
  Administered 2022-12-16: 150 mg via INTRAMUSCULAR

## 2022-12-16 MED ORDER — VALACYCLOVIR HCL 500 MG PO TABS: 500.0000 mg | ORAL_TABLET | Freq: Two times a day (BID) | ORAL | 3 refills | Status: DC

## 2022-12-16 NOTE — Progress Notes (Signed)
ANNUAL EXAM Patient name: Breanna Webb MRN [id number]  Date of birth: 28-Aug-1985 Chief Complaint:   Annual Exam  History of Present Illness:   Breanna Webb is a 37 y.o. [phone]  Asian  female being seen today for a routine annual exam.  Current complaints: None. Desires to continue Depo injections. Desires STI screening as well as annual screening lab work.  No LMP recorded. Patient has had an injection.   Upstream - 12/16/22 1308       Pregnancy Intention Screening   Does the patient want to become pregnant in the next year? No    Does the patient's partner want to become pregnant in the next year? No    Would the patient like to discuss contraceptive options today? No      Contraception Wrap Up   Current Method Hormonal Injection    End Method Hormonal Injection    Contraception Counseling Provided Yes    How was the end contraceptive method provided? Provided on site            The pregnancy intention screening data noted above was reviewed. Potential methods of contraception were discussed. The patient elected to proceed with Hormonal Injection.      Component Value Date/Time   DIAGPAP  02/24/2021 1554    - Negative for intraepithelial lesion or malignancy (NILM)   DIAGPAP  10/15/2019 1546    - Negative for intraepithelial lesion or malignancy (NILM)   HPVHIGH Positive (A) 02/24/2021 1554   HPVHIGH Positive (A) 10/15/2019 1546   ADEQPAP  02/24/2021 1554    Satisfactory for evaluation; transformation zone component PRESENT.   ADEQPAP  10/15/2019 1546    Satisfactory for evaluation; transformation zone component PRESENT.       Last pap NILM, HRHPV+ 12/22. H/O abnormal pap: yes Last mammogram: n/a. Results were: N/A. Family h/o breast cancer: no Last colonoscopy: n/a. Results were: N/A. Family h/o colorectal cancer: no     12/16/2022    3:21 PM 02/13/2021    3:34 PM 09/18/2019    9:57 AM  Depression screen PHQ 2/9  Decreased Interest 0 0 0  Down,  Depressed, Hopeless 0 0 0  PHQ - 2 Score 0 0 0         No data to display           Review of Systems:   Pertinent items are noted in HPI Denies any headaches, blurred vision, fatigue, shortness of breath, chest pain, abdominal pain, abnormal vaginal discharge/itching/odor/irritation, problems with periods, bowel movements, urination, or intercourse unless otherwise stated above. Pertinent History Reviewed:  Reviewed past medical,surgical, social and family history.  Reviewed problem list, medications and allergies. Physical Assessment:   Vitals:   12/16/22 1306  BP: 107/73  Pulse: 83  Weight: 152 lb (68.9 kg)  Height: 5' (1.524 m)  Body mass index is 29.69 kg/m.       Physical Exam Vitals reviewed.  Constitutional:      Appearance: Normal appearance.  HENT:     Head: Normocephalic.  Neck:     Thyroid: No thyroid mass or thyromegaly.  Cardiovascular:     Rate and Rhythm: Normal rate and regular rhythm.     Heart sounds: Normal heart sounds.  Pulmonary:     Effort: Pulmonary effort is normal.     Breath sounds: Normal breath sounds.  Chest:  Breasts:    Tanner Score is 5.     Right: Normal.     Left: Normal.  Abdominal:     General: Abdomen is flat.     Palpations: Abdomen is soft.     Tenderness: There is no abdominal tenderness.  Genitourinary:    General: Normal vulva.     Vagina: Normal.     Cervix: Normal. No cervical motion tenderness.     Uterus: Normal.      Adnexa:        Right: No mass or tenderness.         Left: No mass or tenderness.    Musculoskeletal:     Cervical back: Neck supple. No tenderness.  Skin:    General: Skin is warm and dry.  Neurological:     General: No focal deficit present.     Mental Status: She is alert and oriented to person, place, and time.  Psychiatric:        Mood and Affect: Mood normal.        Behavior: Behavior normal.      No results found for this or any previous visit (from the past 24 hour(s)).   Assessment & Plan:  1. Encounter for well woman exam with routine gynecological exam - medroxyPROGESTERone Acetate SUSY 150 mg - POCT urine pregnancy - Cytology - PAP - Basic metabolic panel - CBC - RPR - HIV Antibody (routine testing w rflx) - Hemoglobin A1c - Lipid panel - TSH - VITAMIN D 25 Hydroxy (Vit-D Deficiency, Fractures)  2. Encounter for management and injection of depo-Provera - medroxyPROGESTERone Acetate SUSY 150 mg - POCT urine pregnancy  3. Cervical cancer screening - Cytology - PAP  4. Encounter for screening for human papillomavirus (HPV) - Cytology - PAP  5. History of HPV infection  6. Screening for diabetes mellitus  7. Screening examination for venereal disease - RPR - Cervicovaginal ancillary only  8. Screening for human immunodeficiency virus - HIV Antibody (routine testing w rflx)  9. Screening for thyroid disorder - TSH  10. Encounter for wellness examination in adult - Basic metabolic panel - CBC - RPR - HIV Antibody (routine testing w rflx) - Hemoglobin A1c - Lipid panel - TSH - VITAMIN D 25 Hydroxy (Vit-D Deficiency, Fractures)   Mammogram: @ 35yo, or sooner if problems Colonoscopy: @ 35yo, or sooner if problems  Orders Placed This Encounter  Procedures   Basic metabolic panel   CBC   RPR   HIV Antibody (routine testing w rflx)   Hemoglobin A1c   Lipid panel   TSH   VITAMIN D 25 Hydroxy (Vit-D Deficiency, Fractures)   POCT urine pregnancy    Meds:  Meds ordered this encounter  Medications   medroxyPROGESTERone Acetate SUSY 150 mg   valACYclovir (VALTREX) 500 MG tablet    Sig: Take 1 tablet (500 mg total) by mouth 2 (two) times daily. For three days with outbreak.    Dispense:  30 tablet    Refill:  3    Order Specific Question:   Supervising Provider    Answer:   Hildred Laser [[id number]]    Follow-up: Return in 3 months (on 03/18/2023) for Depo injection, 03/03/2023-03/17/2023, annual in one year.  Dominica Severin, CNM 12/16/2022 3:23 PM

## 2022-12-16 NOTE — Patient Instructions (Signed)

## 2022-12-17 ENCOUNTER — Telehealth: Payer: Self-pay

## 2022-12-17 DIAGNOSIS — R300 Dysuria: Secondary | ICD-10-CM | POA: Diagnosis not present

## 2022-12-17 DIAGNOSIS — N3001 Acute cystitis with hematuria: Secondary | ICD-10-CM | POA: Diagnosis not present

## 2022-12-17 LAB — BASIC METABOLIC PANEL
BUN/Creatinine Ratio: 16 (ref 9–23)
BUN: 10 mg/dL (ref 6–20)
CO2: 22 mmol/L (ref 20–29)
Calcium: 9.4 mg/dL (ref 8.7–10.2)
Chloride: 107 mmol/L — ABNORMAL HIGH (ref 96–106)
Creatinine, Ser: 0.62 mg/dL (ref 0.57–1.00)
Glucose: 87 mg/dL (ref 70–99)
Potassium: 4.2 mmol/L (ref 3.5–5.2)
Sodium: 142 mmol/L (ref 134–144)
eGFR: 119 mL/min/{1.73_m2} (ref >59)

## 2022-12-17 LAB — RPR: RPR Ser Ql: NONREACTIVE

## 2022-12-17 LAB — CBC
Hematocrit: 38.6 % (ref 34.0–46.6)
Hemoglobin: 12.1 g/dL (ref 11.1–15.9)
MCH: 21.8 pg — ABNORMAL LOW (ref 26.6–33.0)
MCHC: 31.3 g/dL — ABNORMAL LOW (ref 31.5–35.7)
MCV: 70 fL — ABNORMAL LOW (ref 79–97)
Platelets: 276 10*3/uL (ref 150–450)
RBC: 5.54 x10E6/uL — ABNORMAL HIGH (ref 3.77–5.28)
RDW: 18.2 % — ABNORMAL HIGH (ref 11.7–15.4)
WBC: 9.3 10*3/uL (ref 3.4–10.8)

## 2022-12-17 LAB — HIV ANTIBODY (ROUTINE TESTING W REFLEX): HIV Screen 4th Generation wRfx: NONREACTIVE

## 2022-12-17 LAB — HEMOGLOBIN A1C
Est. average glucose Bld gHb Est-mCnc: 88 mg/dL
Hgb A1c MFr Bld: 4.7 % — ABNORMAL LOW (ref 4.8–5.6)

## 2022-12-17 LAB — LIPID PANEL
Chol/HDL Ratio: 3.4 {ratio} (ref 0.0–4.4)
Cholesterol, Total: 181 mg/dL (ref 100–199)
HDL: 54 mg/dL (ref >39)
LDL Chol Calc (NIH): 81 mg/dL (ref 0–99)
Triglycerides: 280 mg/dL — ABNORMAL HIGH (ref 0–149)
VLDL Cholesterol Cal: 46 mg/dL — ABNORMAL HIGH (ref 5–40)

## 2022-12-17 LAB — TSH: TSH: 0.852 u[IU]/mL (ref 0.450–4.500)

## 2022-12-17 LAB — VITAMIN D 25 HYDROXY (VIT D DEFICIENCY, FRACTURES): Vit D, 25-Hydroxy: 9.8 ng/mL — ABNORMAL LOW (ref 30.0–100.0)

## 2022-12-17 NOTE — Telephone Encounter (Signed)
Pt called triage saying she is hurting when she urinates since this morning, she is having trouble reading her urine test from yesterday? Advised no urinalysis was done, just UPT and it was NEG. Advised AZO, push water intake/add cranberry juice, tylenol/ibuprofen OR urgent care. She will f/u as needed.

## 2022-12-19 ENCOUNTER — Other Ambulatory Visit: Payer: Self-pay | Admitting: Certified Nurse Midwife

## 2022-12-19 MED ORDER — VITAMIN D (ERGOCALCIFEROL) 1.25 MG (50000 UNIT) PO CAPS
50000.0000 [IU] | ORAL_CAPSULE | ORAL | 0 refills | Status: DC
Start: 2022-12-19 — End: 2022-12-20

## 2022-12-20 ENCOUNTER — Other Ambulatory Visit: Payer: Self-pay

## 2022-12-20 DIAGNOSIS — E559 Vitamin D deficiency, unspecified: Secondary | ICD-10-CM

## 2022-12-20 LAB — CERVICOVAGINAL ANCILLARY ONLY
Chlamydia: NEGATIVE
Comment: NEGATIVE
Comment: NEGATIVE
Comment: NORMAL
Neisseria Gonorrhea: NEGATIVE
Trichomonas: NEGATIVE

## 2022-12-20 MED ORDER — VITAMIN D (ERGOCALCIFEROL) 1.25 MG (50000 UNIT) PO CAPS: 50000.0000 [IU] | ORAL_CAPSULE | ORAL | 0 refills | Status: AC

## 2022-12-22 LAB — CYTOLOGY - PAP
Comment: NEGATIVE
Diagnosis: UNDETERMINED — AB
High risk HPV: POSITIVE — AB

## 2022-12-30 ENCOUNTER — Other Ambulatory Visit: Payer: Self-pay | Admitting: Certified Nurse Midwife

## 2023-01-05 ENCOUNTER — Telehealth: Payer: Self-pay | Admitting: Certified Nurse Midwife

## 2023-01-05 NOTE — Telephone Encounter (Signed)
Pt is scheduled for a colpo with Dr. Lonny Prude on 01/11/2023 at 9:55.

## 2023-01-05 NOTE — Telephone Encounter (Addendum)
Contacted the patient via phone, the patient needs to schedule a colpo. No answer, "call could not be completed as dialed".

## 2023-01-05 NOTE — Progress Notes (Signed)
Phone encounter created unable to reach the patient via phone

## 2023-01-11 ENCOUNTER — Ambulatory Visit: Payer: Self-pay | Admitting: Obstetrics

## 2023-03-07 ENCOUNTER — Ambulatory Visit: Payer: Medicaid Other

## 2023-03-07 DIAGNOSIS — Z3042 Encounter for surveillance of injectable contraceptive: Secondary | ICD-10-CM

## 2023-03-08 ENCOUNTER — Ambulatory Visit: Payer: Self-pay

## 2023-03-10 ENCOUNTER — Telehealth: Payer: Self-pay | Admitting: Hematology and Oncology

## 2023-03-10 ENCOUNTER — Ambulatory Visit: Payer: Self-pay

## 2023-04-11 ENCOUNTER — Ambulatory Visit (INDEPENDENT_AMBULATORY_CARE_PROVIDER_SITE_OTHER): Payer: Self-pay

## 2023-04-11 VITALS — BP 118/86 | HR 108 | Ht 60.0 in | Wt 148.0 lb

## 2023-04-11 DIAGNOSIS — Z3042 Encounter for surveillance of injectable contraceptive: Secondary | ICD-10-CM

## 2023-04-11 DIAGNOSIS — Z3202 Encounter for pregnancy test, result negative: Secondary | ICD-10-CM

## 2023-04-11 LAB — POCT URINE PREGNANCY: Preg Test, Ur: NEGATIVE

## 2023-04-11 MED ORDER — MEDROXYPROGESTERONE ACETATE 150 MG/ML IM SUSP
150.0000 mg | Freq: Once | INTRAMUSCULAR | Status: AC
  Administered 2023-04-11: 150 mg via INTRAMUSCULAR

## 2023-04-11 NOTE — Progress Notes (Signed)
    NURSE VISIT NOTE  Subjective:    Patient ID: Breanna Webb, female    DOB: June 22, 1985, 38 y.o.   MRN: [id number]  HPI  Patient is a 38 y.o. [id number] female who presents for depo provera  injection.   Objective:    BP 118/86   Pulse (!) 108   Ht 5' (1.524 m)   Wt 148 lb (67.1 kg)   BMI 28.90 kg/m   Last Annual: 12/16/22. Last pap: 12/16/22. Last Depo-Provera : 12/16/22. Side Effects if any: n/a. Serum HCG indicated? Yes , negative.  Depo-Provera  150 mg IM given by: Woody Heading, CMA. Site: Right Upper Outer Quandrant    Assessment:   1. Encounter for Depo-Provera  contraception      Plan:   Next appointment due between 06/27/23 and 07/11/23.    Vale Garrison, CMA

## 2023-07-04 ENCOUNTER — Ambulatory Visit (INDEPENDENT_AMBULATORY_CARE_PROVIDER_SITE_OTHER): Payer: Self-pay

## 2023-07-04 VITALS — BP 111/76 | HR 60 | Ht 60.0 in | Wt 159.3 lb

## 2023-07-04 DIAGNOSIS — Z3042 Encounter for surveillance of injectable contraceptive: Secondary | ICD-10-CM

## 2023-07-04 MED ORDER — MEDROXYPROGESTERONE ACETATE 150 MG/ML IM SUSP
150.0000 mg | Freq: Once | INTRAMUSCULAR | Status: AC
  Administered 2023-07-04: 150 mg via INTRAMUSCULAR

## 2023-07-04 NOTE — Progress Notes (Signed)
    NURSE VISIT NOTE  Subjective:    Patient ID: Breanna Webb, female    DOB: [birth year], 38 y.o.   MRN: [id number]  HPI  Patient is a 38 y.o. [id number] female who presents for depo provera  injection.   Objective:    BP 111/76   Pulse 60   Ht 5' (1.524 m)   Wt 159 lb 4.8 oz (72.3 kg)   BMI 31.11 kg/m   Last Annual: 12/16/22. Last pap: 12/16/22. Last Depo-Provera : 04/11/23. Side Effects if any: n/a. Serum HCG indicated? No . Depo-Provera  150 mg IM given by: Woody Heading, CMA. Site: Right Upper Outer Quandrant   Assessment:   1. Encounter for Depo-Provera  contraception      Plan:   Next appointment due between 09/19/23 and 10/03/23.    Vale Garrison, CMA

## 2023-09-16 NOTE — Progress Notes (Deleted)
    NURSE VISIT NOTE  Subjective:    Patient ID: Breanna Webb, female    DOB: 1985/05/08, 38 y.o.   MRN: [id number]  HPI  Patient is a 37 y.o. [id number] female who presents for depo provera  injection.   Objective:    There were no vitals taken for this visit.  Last Annual: ***. Last pap: ***. Last Depo-Provera : ***. Side Effects if any: {NONE:21772}***. Serum HCG indicated? {YES/NO:21197}. Depo-Provera  150 mg IM given by: {AOB Clinical Dujqq:[id number]}. Site: {AOB INJ [id number]  Lab Review  No results found for any visits on 09/19/23.  Assessment:   No diagnosis found.   Plan:   Next appointment due between *** and ***.    Burnard LITTIE Ro, CMA

## 2023-09-19 ENCOUNTER — Ambulatory Visit: Payer: Self-pay

## 2023-10-11 DIAGNOSIS — R319 Hematuria, unspecified: Secondary | ICD-10-CM | POA: Diagnosis not present

## 2023-10-11 DIAGNOSIS — N23 Unspecified renal colic: Secondary | ICD-10-CM | POA: Diagnosis not present

## 2023-10-11 DIAGNOSIS — N136 Pyonephrosis: Secondary | ICD-10-CM | POA: Diagnosis not present

## 2023-10-20 ENCOUNTER — Ambulatory Visit (INDEPENDENT_AMBULATORY_CARE_PROVIDER_SITE_OTHER): Payer: Self-pay | Admitting: Urology

## 2023-10-20 ENCOUNTER — Ambulatory Visit: Payer: Self-pay | Admitting: Urology

## 2023-10-20 ENCOUNTER — Encounter: Payer: Self-pay | Admitting: Urology

## 2023-10-20 ENCOUNTER — Ambulatory Visit (HOSPITAL_BASED_OUTPATIENT_CLINIC_OR_DEPARTMENT_OTHER)
Admission: RE | Admit: 2023-10-20 | Discharge: 2023-10-20 | Disposition: A | Discharge status: Home / Self Care | Payer: Self-pay | Source: Ambulatory Visit | Attending: Urology | Admitting: Urology

## 2023-10-20 VITALS — BP 129/81 | HR 74 | Ht 60.0 in | Wt 152.0 lb

## 2023-10-20 DIAGNOSIS — N200 Calculus of kidney: Secondary | ICD-10-CM

## 2023-10-20 LAB — URINALYSIS, ROUTINE W REFLEX MICROSCOPIC
Bilirubin, UA: NEGATIVE
Glucose, UA: NEGATIVE
Leukocytes,UA: NEGATIVE
Nitrite, UA: NEGATIVE
Protein,UA: NEGATIVE
RBC, UA: NEGATIVE
Specific Gravity, UA: 1.025 (ref 1.005–1.030)
Urobilinogen, Ur: 0.2 mg/dL (ref 0.2–1.0)
pH, UA: 6 (ref 5.0–7.5)

## 2023-10-20 LAB — MICROSCOPIC EXAMINATION

## 2023-10-20 MED ORDER — OXYCODONE HCL 5 MG PO TABS: 5.0000 mg | ORAL_TABLET | Freq: Four times a day (QID) | ORAL | 0 refills | Status: AC | PRN

## 2023-10-20 MED ORDER — PROMETHAZINE HCL 25 MG PO TABS: 25.0000 mg | ORAL_TABLET | Freq: Four times a day (QID) | ORAL | 0 refills | Status: AC | PRN

## 2023-10-20 NOTE — H&P (View-Only) (Signed)
 Assessment: 1. Nephrolithiasis     Plan: I personally reviewed the patient's chart including provider notes, lab and imaging results. I personally reviewed the CT study from [facility] from 10/11/2023 with results as noted below. I reviewed the KUB study from today with results as noted below. Options for management of the right renal calculus discussed with the patient including shockwave lithotripsy and ureteroscopic laser lithotripsy.  Risk and benefits of each treatment reviewed.  Following our discussion, she would like to proceed with right shockwave lithotripsy.  Procedure: The patient will be scheduled for RIGHT shock wave lithotripsy at [facility].  Surgical request is placed with the surgery schedulers and will be scheduled at the patient's/family request. Informed consent is given as documented below. Anesthesia: local  The patient does not have sleep apnea, history of MRSA, history of VRE, history of cardiac device requiring special anesthetic needs. Patient is stable and considered clear for surgical management in an outpatient ambulatory surgery setting as well as inpatient hospital setting.  Consent for Operation or Procedure: Provider Certification I hereby certify that the nature, purpose, benefits, usual and most frequent risks of, and alternatives to, the operation or procedure have been explained to the patient (or person authorized to sign for the patient) either by me as responsible physician or by the provider who is to perform the operation or procedure. Time spent such that the patient/family has had an opportunity to ask questions, and that those questions have been answered. The patient or the patient's representative has been advised that selected tasks may be performed by assistants to the primary health care provider(s). I believe that the patient (or person authorized to sign for the patient) understands what has been explained, and has consented to the  operation or procedure. No guarantees were implied or made.    Chief Complaint:  Chief Complaint  Patient presents with   Nephrolithiasis    History of Present Illness:  Breanna Webb is a 38 y.o. female who is seen in consultation from John Dallara, MD for evaluation of right nephrolithiasis.  She presented to the emergency room at [facility] on 10/11/2023 with right flank pain.  She had radiation of the pain to her right upper quadrant.  She had associated nausea and vomiting.  No fever.  She reported subjective chills. Evaluation demonstrated a normal creatinine of 0.63, elevated WBC to 12.7, urinalysis with 4 WBCs and 150 RBCs.  Urine culture grew mixed flora. CT showed a 8 mm calculus at the right UPJ with moderate right hydronephrosis and a small nonobstructing calculus in the left lower kidney. She was treated with pain medication, antibiotics, and antiemetics. Her pain has been controlled during the past week.  No further nausea or vomiting.  No dysuria or gross hematuria.  She completed antibiotics.  Past Medical History:  Past Medical History:  Diagnosis Date   Anemia    Patient reports anemia with her pregnancies   Infection    UTI   Miscarriage    Vaginal Pap smear, abnormal     Past Surgical History:  Past Surgical History:  Procedure Laterality Date   galbladder removed     SALPINGECTOMY Left 09/2020    Allergies:  No Known Allergies  Family History:  Family History  Problem Relation Age of Onset   Healthy Mother    Healthy Father     Social History:  Social History   Tobacco Use   Smoking status: Former   Smokeless tobacco: Never  Tobacco comments:    quit 2015  Vaping Use   Vaping status: Never Used  Substance Use Topics   Alcohol use: Not Currently    Comment: 2-3 per week when not pregnant   Drug use: Not Currently    Types: Marijuana    Comment: last use mj- one week ago    Review of symptoms:  Constitutional:  Negative for  unexplained weight loss, night sweats, fever, chills ENT:  Negative for nose bleeds, sinus pain, painful swallowing CV:  Negative for chest pain, shortness of breath, exercise intolerance, palpitations, loss of consciousness Resp:  Negative for cough, wheezing, shortness of breath GI:  Negative for diarrhea, bloody stools GU:  Positives noted in HPI; otherwise negative for gross hematuria, dysuria, urinary incontinence Neuro:  Negative for seizures, poor balance, limb weakness, slurred speech Psych:  Negative for lack of energy, depression, anxiety Endocrine:  Negative for polydipsia, polyuria, symptoms of hypoglycemia (dizziness, hunger, sweating) Hematologic:  Negative for anemia, purpura, petechia, prolonged or excessive bleeding, use of anticoagulants  Allergic:  Negative for difficulty breathing or choking as a result of exposure to anything; no shellfish allergy; no allergic response (rash/itch) to materials, foods  Physical exam: BP 129/81   Pulse 74   Ht 5' (1.524 m)   Wt 152 lb (68.9 kg)   BMI 29.69 kg/m  GENERAL APPEARANCE:  Well appearing, well developed, well nourished, NAD HEENT: Atraumatic, Normocephalic, oropharynx clear. NECK: Supple without lymphadenopathy or thyromegaly. LUNGS: Clear to auscultation bilaterally. HEART: Regular Rate and Rhythm without murmurs, gallops, or rubs. ABDOMEN: Soft, non-tender, No Masses. EXTREMITIES: Moves all extremities well.  Without clubbing, cyanosis, or edema. NEUROLOGIC:  Alert and oriented x 3, normal gait, CN II-XII grossly intact.  MENTAL STATUS:  Appropriate. BACK:  Non-tender to palpation.  No CVAT SKIN:  Warm, dry and intact.    Results: U/A: 6-10 WBCs, 0-2 RBCs, few bacteria  KUB: 7 x 3 mm calcification seen lateral to the L3 vertebral body consistent with the right proximal ureteral calculus.

## 2023-10-20 NOTE — Progress Notes (Signed)
 Assessment: 1. Nephrolithiasis     Plan: I personally reviewed the patient's chart including provider notes, lab and imaging results. I personally reviewed the CT study from [facility] from 10/11/2023 with results as noted below. I reviewed the KUB study from today with results as noted below. Options for management of the right renal calculus discussed with the patient including shockwave lithotripsy and ureteroscopic laser lithotripsy.  Risk and benefits of each treatment reviewed.  Following our discussion, she would like to proceed with right shockwave lithotripsy.  Procedure: The patient will be scheduled for RIGHT shock wave lithotripsy at [facility].  Surgical request is placed with the surgery schedulers and will be scheduled at the patient's/family request. Informed consent is given as documented below. Anesthesia: local  The patient does not have sleep apnea, history of MRSA, history of VRE, history of cardiac device requiring special anesthetic needs. Patient is stable and considered clear for surgical management in an outpatient ambulatory surgery setting as well as inpatient hospital setting.  Consent for Operation or Procedure: Provider Certification I hereby certify that the nature, purpose, benefits, usual and most frequent risks of, and alternatives to, the operation or procedure have been explained to the patient (or person authorized to sign for the patient) either by me as responsible physician or by the provider who is to perform the operation or procedure. Time spent such that the patient/family has had an opportunity to ask questions, and that those questions have been answered. The patient or the patient's representative has been advised that selected tasks may be performed by assistants to the primary health care provider(s). I believe that the patient (or person authorized to sign for the patient) understands what has been explained, and has consented to the  operation or procedure. No guarantees were implied or made.    Chief Complaint:  Chief Complaint  Patient presents with   Nephrolithiasis    History of Present Illness:  Breanna Webb is a 38 y.o. female who is seen in consultation from John Dallara, MD for evaluation of right nephrolithiasis.  She presented to the emergency room at [facility] on 10/11/2023 with right flank pain.  She had radiation of the pain to her right upper quadrant.  She had associated nausea and vomiting.  No fever.  She reported subjective chills. Evaluation demonstrated a normal creatinine of 0.63, elevated WBC to 12.7, urinalysis with 4 WBCs and 150 RBCs.  Urine culture grew mixed flora. CT showed a 8 mm calculus at the right UPJ with moderate right hydronephrosis and a small nonobstructing calculus in the left lower kidney. She was treated with pain medication, antibiotics, and antiemetics. Her pain has been controlled during the past week.  No further nausea or vomiting.  No dysuria or gross hematuria.  She completed antibiotics.  Past Medical History:  Past Medical History:  Diagnosis Date   Anemia    Patient reports anemia with her pregnancies   Infection    UTI   Miscarriage    Vaginal Pap smear, abnormal     Past Surgical History:  Past Surgical History:  Procedure Laterality Date   galbladder removed     SALPINGECTOMY Left 09/2020    Allergies:  No Known Allergies  Family History:  Family History  Problem Relation Age of Onset   Healthy Mother    Healthy Father     Social History:  Social History   Tobacco Use   Smoking status: Former   Smokeless tobacco: Never  Tobacco comments:    quit 2015  Vaping Use   Vaping status: Never Used  Substance Use Topics   Alcohol use: Not Currently    Comment: 2-3 per week when not pregnant   Drug use: Not Currently    Types: Marijuana    Comment: last use mj- one week ago    Review of symptoms:  Constitutional:  Negative for  unexplained weight loss, night sweats, fever, chills ENT:  Negative for nose bleeds, sinus pain, painful swallowing CV:  Negative for chest pain, shortness of breath, exercise intolerance, palpitations, loss of consciousness Resp:  Negative for cough, wheezing, shortness of breath GI:  Negative for diarrhea, bloody stools GU:  Positives noted in HPI; otherwise negative for gross hematuria, dysuria, urinary incontinence Neuro:  Negative for seizures, poor balance, limb weakness, slurred speech Psych:  Negative for lack of energy, depression, anxiety Endocrine:  Negative for polydipsia, polyuria, symptoms of hypoglycemia (dizziness, hunger, sweating) Hematologic:  Negative for anemia, purpura, petechia, prolonged or excessive bleeding, use of anticoagulants  Allergic:  Negative for difficulty breathing or choking as a result of exposure to anything; no shellfish allergy; no allergic response (rash/itch) to materials, foods  Physical exam: BP 129/81   Pulse 74   Ht 5' (1.524 m)   Wt 152 lb (68.9 kg)   BMI 29.69 kg/m  GENERAL APPEARANCE:  Well appearing, well developed, well nourished, NAD HEENT: Atraumatic, Normocephalic, oropharynx clear. NECK: Supple without lymphadenopathy or thyromegaly. LUNGS: Clear to auscultation bilaterally. HEART: Regular Rate and Rhythm without murmurs, gallops, or rubs. ABDOMEN: Soft, non-tender, No Masses. EXTREMITIES: Moves all extremities well.  Without clubbing, cyanosis, or edema. NEUROLOGIC:  Alert and oriented x 3, normal gait, CN II-XII grossly intact.  MENTAL STATUS:  Appropriate. BACK:  Non-tender to palpation.  No CVAT SKIN:  Warm, dry and intact.    Results: U/A: 6-10 WBCs, 0-2 RBCs, few bacteria  KUB: 7 x 3 mm calcification seen lateral to the L3 vertebral body consistent with the right proximal ureteral calculus.

## 2023-10-21 ENCOUNTER — Encounter (HOSPITAL_COMMUNITY): Payer: Self-pay | Admitting: Urology

## 2023-10-21 NOTE — Progress Notes (Signed)
 Spoke w/ via phone for pre-op interview---Breanna Webb needs dos---- KUB              COVID test -----patient states asymptomatic no test needed Arrive at -------1145 NPO after MN NO Solid Food.  Clear liquids from MN until---6 AM Med rec completed. Pt aware to hold ASA/NSAIDs and supplements per [facility] protocol.  Medications to take morning of surgery -----iron and vit D Diabetic/Weight loss medication ----- No Alcohol or recreational drugs for 24 hours/Tobacco products for 6 hours ---- Patient instructed to bring blue lithotripsy folder, photo id and insurance card day of surgery. Patient aware to have Driver (ride ) / caregiver for 24 hours after surgery -----[employer] [phone] Patient Special Instructions -----wear comfortable clothes without metal belt buckles, buttons, or zippers. Pre-Op special Instructions ----- take laxative of choice day before procedure Patient verbalized understanding of instructions that were given at this phone interview. Patient denies shortness of breath, chest pain, fever, cough at this phone interview.

## 2023-10-24 ENCOUNTER — Encounter (HOSPITAL_COMMUNITY): Payer: Self-pay | Admitting: Urology

## 2023-10-24 ENCOUNTER — Other Ambulatory Visit: Payer: Self-pay

## 2023-10-24 ENCOUNTER — Encounter (HOSPITAL_COMMUNITY)
Admission: RE | Disposition: A | Discharge status: Home / Self Care | Payer: Self-pay | Source: Home / Self Care | Attending: Urology

## 2023-10-24 ENCOUNTER — Ambulatory Visit (HOSPITAL_COMMUNITY)
Admission: RE | Admit: 2023-10-24 | Discharge: 2023-10-24 | Disposition: A | Discharge status: Home / Self Care | Payer: Self-pay | Attending: Urology | Admitting: Urology

## 2023-10-24 DIAGNOSIS — N200 Calculus of kidney: Secondary | ICD-10-CM

## 2023-10-24 DIAGNOSIS — Z01818 Encounter for other preprocedural examination: Secondary | ICD-10-CM

## 2023-10-24 DIAGNOSIS — N132 Hydronephrosis with renal and ureteral calculous obstruction: Secondary | ICD-10-CM | POA: Insufficient documentation

## 2023-10-24 DIAGNOSIS — J45909 Unspecified asthma, uncomplicated: Secondary | ICD-10-CM | POA: Insufficient documentation

## 2023-10-24 HISTORY — PX: EXTRACORPOREAL SHOCK WAVE LITHOTRIPSY: SHX1557

## 2023-10-24 HISTORY — DX: Personal history of urinary calculi: Z87.442

## 2023-10-24 LAB — POCT PREGNANCY, URINE: Preg Test, Ur: NEGATIVE

## 2023-10-24 SURGERY — LITHOTRIPSY, ESWL
Anesthesia: LOCAL | Laterality: Right

## 2023-10-24 MED ORDER — DIPHENHYDRAMINE HCL 25 MG PO CAPS
ORAL_CAPSULE | ORAL | Status: AC
  Administered 2023-10-24: 25 mg
  Filled 2023-10-24: qty 1

## 2023-10-24 MED ORDER — DIAZEPAM 5 MG PO TABS
ORAL_TABLET | ORAL | Status: AC
  Filled 2023-10-24: qty 1

## 2023-10-24 MED ORDER — CIPROFLOXACIN HCL 500 MG PO TABS
ORAL_TABLET | ORAL | Status: AC
  Filled 2023-10-24: qty 1

## 2023-10-24 MED ORDER — CIPROFLOXACIN HCL 500 MG PO TABS
500.0000 mg | ORAL_TABLET | ORAL | Status: AC
  Administered 2023-10-24: 500 mg via ORAL

## 2023-10-24 MED ORDER — DIPHENHYDRAMINE HCL 25 MG PO CAPS
25.0000 mg | ORAL_CAPSULE | ORAL | Status: AC
  Administered 2023-10-24: 25 mg via ORAL

## 2023-10-24 MED ORDER — TAMSULOSIN HCL 0.4 MG PO CAPS: 0.4000 mg | ORAL_CAPSULE | Freq: Every day | ORAL | 1 refills | Status: AC

## 2023-10-24 MED ORDER — DIAZEPAM 5 MG PO TABS
5.0000 mg | ORAL_TABLET | ORAL | Status: AC
  Administered 2023-10-24: 5 mg via ORAL

## 2023-10-24 MED ORDER — SODIUM CHLORIDE 0.9 % IV SOLN: INTRAVENOUS | Status: DC

## 2023-10-24 MED ORDER — SODIUM CHLORIDE 0.9 % IV SOLN
INTRAVENOUS | Status: DC
  Administered 2023-10-24: 1000 mL via INTRAVENOUS

## 2023-10-24 NOTE — Op Note (Signed)
 Operative Note  Procedure:  right shock wave lithotripsy  Please see Operative Note from [facility] scanned into chart.  Adine DOROTHA Manly, MD [facility] Urology [facility] [phone]

## 2023-10-24 NOTE — Interval H&P Note (Signed)
 History and Physical Interval Note:  10/24/2023 1:38 PM  Breanna Webb  has presented today for surgery, with the diagnosis of Calculus of kidney.  The various methods of treatment have been discussed with the patient and family. After consideration of risks, benefits and other options for treatment, the patient has consented to  Procedure(s): LITHOTRIPSY, ESWL (Right) as a surgical intervention.  The patient's history has been reviewed, patient examined, no change in status, stable for surgery.  I have reviewed the patient's chart and labs.  Questions were answered to the patient's satisfaction.     Adine Manly

## 2023-10-25 ENCOUNTER — Encounter (HOSPITAL_COMMUNITY): Payer: Self-pay | Admitting: Urology

## 2023-10-28 ENCOUNTER — Other Ambulatory Visit: Payer: Self-pay

## 2023-10-28 DIAGNOSIS — N200 Calculus of kidney: Secondary | ICD-10-CM

## 2023-11-01 ENCOUNTER — Ambulatory Visit (HOSPITAL_BASED_OUTPATIENT_CLINIC_OR_DEPARTMENT_OTHER)
Admission: RE | Admit: 2023-11-01 | Discharge: 2023-11-01 | Disposition: A | Discharge status: Home / Self Care | Payer: Self-pay | Source: Ambulatory Visit | Attending: Urology | Admitting: Urology

## 2023-11-01 ENCOUNTER — Encounter: Payer: Self-pay | Admitting: Urology

## 2023-11-01 ENCOUNTER — Ambulatory Visit: Payer: Self-pay | Admitting: Urology

## 2023-11-01 VITALS — BP 121/83 | HR 72 | Ht 60.0 in | Wt 152.0 lb

## 2023-11-01 DIAGNOSIS — N200 Calculus of kidney: Secondary | ICD-10-CM | POA: Insufficient documentation

## 2023-11-01 LAB — URINALYSIS, ROUTINE W REFLEX MICROSCOPIC
Bilirubin, UA: NEGATIVE
Glucose, UA: NEGATIVE
Ketones, UA: NEGATIVE
Leukocytes,UA: NEGATIVE
Nitrite, UA: NEGATIVE
Specific Gravity, UA: 1.02 (ref 1.005–1.030)
Urobilinogen, Ur: 0.2 mg/dL (ref 0.2–1.0)
pH, UA: 6.5 (ref 5.0–7.5)

## 2023-11-01 LAB — MICROSCOPIC EXAMINATION: Mucus, UA: POSITIVE — AB

## 2023-11-01 NOTE — Progress Notes (Signed)
 Assessment: 1. Nephrolithiasis      Plan: I personally reviewed the KUB study from today with results as noted below. No obvious calcifications are seen on the KUB study today. Continue tamsulosin . Return to office in 3 weeks with KUB.   Chief Complaint:  Chief Complaint  Patient presents with   Nephrolithiasis    History of Present Illness:  Breanna Webb is a 38 y.o. female who is seen for further evaluation of right nephrolithiasis.  She presented to the emergency room at [facility] on 10/11/2023 with right flank pain.  She had radiation of the pain to her right upper quadrant.  She had associated nausea and vomiting.  No fever.  She reported subjective chills. Evaluation demonstrated a normal creatinine of 0.63, elevated WBC to 12.7, urinalysis with 4 WBCs and 150 RBCs.  Urine culture grew mixed flora. CT showed a 8 mm calculus at the right UPJ with moderate right hydronephrosis and a small nonobstructing calculus in the left lower kidney. She was treated with pain medication, antibiotics, and antiemetics. Her pain had been controlled following her ED visit  No further nausea or vomiting.  No dysuria or gross hematuria.  She completed antibiotics. KUB from 10/20/2023 showed a 7 x 3 mm calcification lateral to the L3 vertebral body consistent with a right proximal ureteral calculus. She went right shockwave lithotripsy on 10/24/2023.  She returns today for follow-up.  She has passed some stone fragments following lithotripsy.  She has not had any significant pain.  She reports occasional dull cramps.  No dysuria or gross hematuria.  Portions of the above documentation were copied from a prior visit for review purposes only.   Past Medical History:  Past Medical History:  Diagnosis Date   Anemia    Patient reports anemia with her pregnancies   History of kidney stones    Infection    UTI   Miscarriage    Vaginal Pap smear, abnormal     Past Surgical History:   Past Surgical History:  Procedure Laterality Date   EXTRACORPOREAL SHOCK WAVE LITHOTRIPSY Right 10/24/2023   Procedure: LITHOTRIPSY, ESWL;  Surgeon: Roseann Adine PARAS., MD;  Location: [facility];  Service: Urology;  Laterality: Right;   galbladder removed     SALPINGECTOMY Left 09/2020    Allergies:  No Known Allergies  Family History:  Family History  Problem Relation Age of Onset   Healthy Mother    Healthy Father     Social History:  Social History   Tobacco Use   Smoking status: Former   Smokeless tobacco: Never   Tobacco comments:    quit 2015  Vaping Use   Vaping status: Never Used  Substance Use Topics   Alcohol use: Not Currently    Comment: 2-3 per week when not pregnant   Drug use: Not Currently    Types: Marijuana    Comment: last use mj- one week ago last dose 10/21/2023    ROS: Constitutional:  Negative for fever, chills, weight loss CV: Negative for chest pain, previous MI, hypertension Respiratory:  Negative for shortness of breath, wheezing, sleep apnea, frequent cough GI:  Negative for nausea, vomiting, bloody stool, GERD  Physical exam: BP 121/83   Pulse 72   Ht 5' (1.524 m)   Wt 152 lb (68.9 kg)   LMP  (LMP Unknown)   BMI 29.69 kg/m  GENERAL APPEARANCE:  Well appearing, well developed, well nourished, NAD HEENT:  Atraumatic, normocephalic, oropharynx clear NECK:  Supple without  lymphadenopathy or thyromegaly ABDOMEN:  Soft, non-tender, no masses EXTREMITIES:  Moves all extremities well, without clubbing, cyanosis, or edema NEUROLOGIC:  Alert and oriented x 3, normal gait, CN II-XII grossly intact MENTAL STATUS:  appropriate BACK:  Non-tender to palpation, No CVAT SKIN:  Warm, dry, and intact  Results: U/A: 0-5 RBCs, 0-2 RBCs  KUB: The previously noted calcification adjacent to the L3 transverse process on the right is not seen on today's study; no obvious calcifications are seen along the expected course of the right ureter;  visualization in the pelvis is limited due to bowel gas and stool.

## 2023-11-29 ENCOUNTER — Other Ambulatory Visit: Payer: Self-pay

## 2023-11-29 ENCOUNTER — Ambulatory Visit: Payer: Self-pay | Admitting: Urology

## 2023-11-29 DIAGNOSIS — N200 Calculus of kidney: Secondary | ICD-10-CM

## 2023-11-29 NOTE — Progress Notes (Deleted)
 Assessment: 1. Nephrolithiasis     Plan: I personally reviewed the KUB study from today with results as noted below. No obvious calcifications are seen on the KUB study today. Continue tamsulosin . Return to office in 3 weeks with KUB.   Chief Complaint:  No chief complaint on file.   History of Present Illness:  Phelan Goers is a 38 y.o. female who is seen for further evaluation of right nephrolithiasis.  She presented to the emergency room at [facility] on 10/11/2023 with right flank pain.  She had radiation of the pain to her right upper quadrant.  She had associated nausea and vomiting.  No fever.  She reported subjective chills. Evaluation demonstrated a normal creatinine of 0.63, elevated WBC to 12.7, urinalysis with 4 WBCs and 150 RBCs.  Urine culture grew mixed flora. CT showed a 8 mm calculus at the right UPJ with moderate right hydronephrosis and a small nonobstructing calculus in the left lower kidney. She was treated with pain medication, antibiotics, and antiemetics. Her pain had been controlled following her ED visit  No further nausea or vomiting.  No dysuria or gross hematuria.  She completed antibiotics. KUB from 10/20/2023 showed a 7 x 3 mm calcification lateral to the L3 vertebral body consistent with a right proximal ureteral calculus. She went right shockwave lithotripsy on 10/24/2023. She passed some stone fragments following lithotripsy.  No significant pain.  She reports occasional dull cramps.  No dysuria or gross hematuria.  KUB on 11/01/23 did not show any obvious calcifications.    Portions of the above documentation were copied from a prior visit for review purposes only.   Past Medical History:  Past Medical History:  Diagnosis Date   Anemia    Patient reports anemia with her pregnancies   History of kidney stones    Infection    UTI   Miscarriage    Vaginal Pap smear, abnormal     Past Surgical History:  Past Surgical History:   Procedure Laterality Date   EXTRACORPOREAL SHOCK WAVE LITHOTRIPSY Right 10/24/2023   Procedure: LITHOTRIPSY, ESWL;  Surgeon: Roseann Adine PARAS., MD;  Location: [facility];  Service: Urology;  Laterality: Right;   galbladder removed     SALPINGECTOMY Left 09/2020    Allergies:  No Known Allergies  Family History:  Family History  Problem Relation Age of Onset   Healthy Mother    Healthy Father     Social History:  Social History   Tobacco Use   Smoking status: Former   Smokeless tobacco: Never   Tobacco comments:    quit 2015  Vaping Use   Vaping status: Never Used  Substance Use Topics   Alcohol use: Not Currently    Comment: 2-3 per week when not pregnant   Drug use: Not Currently    Types: Marijuana    Comment: last use mj- one week ago last dose 10/21/2023    ROS: Constitutional:  Negative for fever, chills, weight loss CV: Negative for chest pain, previous MI, hypertension Respiratory:  Negative for shortness of breath, wheezing, sleep apnea, frequent cough GI:  Negative for nausea, vomiting, bloody stool, GERD  Physical exam: There were no vitals taken for this visit. GENERAL APPEARANCE:  Well appearing, well developed, well nourished, NAD HEENT:  Atraumatic, normocephalic, oropharynx clear NECK:  Supple without lymphadenopathy or thyromegaly ABDOMEN:  Soft, non-tender, no masses EXTREMITIES:  Moves all extremities well, without clubbing, cyanosis, or edema NEUROLOGIC:  Alert and oriented x 3, normal gait,  CN II-XII grossly intact MENTAL STATUS:  appropriate BACK:  Non-tender to palpation, No CVAT SKIN:  Warm, dry, and intact  Results: U/A:

## 2023-12-22 ENCOUNTER — Ambulatory Visit: Admitting: Urology

## 2023-12-22 NOTE — Progress Notes (Deleted)
 Assessment: 1. Nephrolithiasis      Plan: I personally reviewed the KUB study from today with results as noted below. No obvious calcifications are seen on the KUB study today. Continue tamsulosin . Return to office in 3 weeks with KUB.   Chief Complaint:  No chief complaint on file.   History of Present Illness:  Breanna Webb is a 38 y.o. female who is seen for further evaluation of right nephrolithiasis.  She presented to the emergency room at [facility] on 10/11/2023 with right flank pain.  She had radiation of the pain to her right upper quadrant.  She had associated nausea and vomiting.  No fever.  She reported subjective chills. Evaluation demonstrated a normal creatinine of 0.63, elevated WBC to 12.7, urinalysis with 4 WBCs and 150 RBCs.  Urine culture grew mixed flora. CT showed a 8 mm calculus at the right UPJ with moderate right hydronephrosis and a small nonobstructing calculus in the left lower kidney. She was treated with pain medication, antibiotics, and antiemetics. Her pain had been controlled following her ED visit  No further nausea or vomiting.  No dysuria or gross hematuria.  She completed antibiotics. KUB from 10/20/2023 showed a 7 x 3 mm calcification lateral to the L3 vertebral body consistent with a right proximal ureteral calculus. She went right shockwave lithotripsy on 10/24/2023. She passed some stone fragments following lithotripsy.  No significant pain.  She reports occasional dull cramps.  No dysuria or gross hematuria.  KUB on 11/01/23 did not show any obvious calcifications.    Portions of the above documentation were copied from a prior visit for review purposes only.   Past Medical History:  Past Medical History:  Diagnosis Date   Anemia    Patient reports anemia with her pregnancies   History of kidney stones    Infection    UTI   Miscarriage    Vaginal Pap smear, abnormal     Past Surgical History:  Past Surgical History:   Procedure Laterality Date   EXTRACORPOREAL SHOCK WAVE LITHOTRIPSY Right 10/24/2023   Procedure: LITHOTRIPSY, ESWL;  Surgeon: Roseann Adine PARAS., MD;  Location: [facility];  Service: Urology;  Laterality: Right;   galbladder removed     SALPINGECTOMY Left 09/2020    Allergies:  No Known Allergies  Family History:  Family History  Problem Relation Age of Onset   Healthy Mother    Healthy Father     Social History:  Social History   Tobacco Use   Smoking status: Former   Smokeless tobacco: Never   Tobacco comments:    quit 2015  Vaping Use   Vaping status: Never Used  Substance Use Topics   Alcohol use: Not Currently    Comment: 2-3 per week when not pregnant   Drug use: Not Currently    Types: Marijuana    Comment: last use mj- one week ago last dose 10/21/2023    ROS: Constitutional:  Negative for fever, chills, weight loss CV: Negative for chest pain, previous MI, hypertension Respiratory:  Negative for shortness of breath, wheezing, sleep apnea, frequent cough GI:  Negative for nausea, vomiting, bloody stool, GERD  Physical exam: There were no vitals taken for this visit. GENERAL APPEARANCE:  Well appearing, well developed, well nourished, NAD HEENT:  Atraumatic, normocephalic, oropharynx clear NECK:  Supple without lymphadenopathy or thyromegaly ABDOMEN:  Soft, non-tender, no masses EXTREMITIES:  Moves all extremities well, without clubbing, cyanosis, or edema NEUROLOGIC:  Alert and oriented x 3, normal  gait, CN II-XII grossly intact MENTAL STATUS:  appropriate BACK:  Non-tender to palpation, No CVAT SKIN:  Warm, dry, and intact  Results: U/A:

## 2024-03-22 NOTE — ED Provider Notes (Signed)
 "                                                                                   Emergency Department Provider Note    ED Clinical Impression   Final diagnoses:  Renal colic on right side (Primary)  Hydroureteronephrosis  Ureteral stone  Kidney stone on left side  Acute cystitis with hematuria    ED Assessment/Plan    Condition: Stable Disposition: Discharge  This chart has been completed using Dragon Medical Dictation software, and while attempts have been made to ensure accuracy, certain words and phrases may not be transcribed as intended.   History   Chief Complaint  Patient presents with   Flank Pain   HPI  Breanna Webb is a 39 y.o. female who presents today to the emergency department complaining of right flank pain that radiates into the right groin since earlier today.  The pain is constant, a 10 at its worst and an 8 at its best, but no aggravating or alleviating factors have been identified.  Having a history of kidney stones and states that this feels the same way it did the last time she had a kidney stone.  Allergies: has no known allergies. Medications: has a current medication list which includes the following long-term medication(s): medroxyprogesterone . PMHx:  has a past medical history of Kidney stone. PSHx:  has a past surgical history that includes pr treat ectopic preg,abd preg (N/A, 10/16/2020); Cholecystectomy; and Tubal ligation (Right). SocHx:  reports that she has never smoked. She has never been exposed to tobacco smoke. She has never used smokeless tobacco. She reports that she does not currently use alcohol. She reports current drug use. Drug: Marijuana. Allergies, Medications, Medical, Surgical, and Social History were reviewed as documented above.   Social Drivers of Health with Concerns   Food Insecurity: Not on file  Transportation Needs: Not on file  Alcohol Use: Not on file  Housing: Not on file  Physical Activity: Not on file   Utilities: Not on file  Stress: Not on file  Interpersonal Safety: Not on file  Substance Use: Not on file (01/04/2023)  Intimate Partner Violence: Not on file  Social Connections: Not on file  Financial Resource Strain: Not on file  Health Literacy: Not on file  Internet Connectivity: Not on file     Review Of Systems  Review of Systems  Constitutional:  Positive for chills. Negative for fever.  Respiratory:  Negative for shortness of breath.   Cardiovascular:  Negative for chest pain.  Gastrointestinal:  Positive for nausea and vomiting.  Genitourinary:  Positive for difficulty urinating and flank pain.  All other systems reviewed and are negative.   Physical Exam   BP 148/88   Pulse 68   Temp 37.1 C (98.7 F) (Oral)   Resp 20   Ht 152.4 cm (5')   Wt 69.5 kg (153 lb 3.2 oz)   LMP 03/21/2024 (Exact Date)   SpO2 98%   BMI 29.92 kg/m   Physical Exam Vitals and nursing note reviewed.  Constitutional:      General: She is not in acute distress.    Appearance: She is well-developed. She  is obese.  HENT:     Head: Normocephalic and atraumatic.  Cardiovascular:     Rate and Rhythm: Normal rate and regular rhythm.     Heart sounds: Normal heart sounds.  Pulmonary:     Effort: Pulmonary effort is normal.     Breath sounds: Normal breath sounds.  Abdominal:     General: Abdomen is flat. Bowel sounds are normal.     Palpations: Abdomen is soft.     Tenderness: There is abdominal tenderness in the right lower quadrant. There is right CVA tenderness.  Neurological:     Mental Status: She is alert and oriented to person, place, and time.  Psychiatric:        Mood and Affect: Mood normal.        Behavior: Behavior normal.     ED Course  Medical Decision Making Reviewed and discussed lab and CT results.  Will treat with Cipro , Flomax  and ibuprofen  and advised Tylenol  if/as needed and increased hydration.  Follow-up with PCP.  I have reviewed my clinical findings  and my clinical impression with the patient. The patient has expressed understanding that at this time there is no evidence for a more serious underlying process, but also understands that early in the process of a condition such as this, an initial workup can be falsely reassuring. I have counseled and discussed follow-up with the patient, stressing the importance of appropriate follow-up. I have also counseled the patient to return if symptoms worsen or if there are any concerns. Routine discharge counseling was given to the patient and the patient understands that worsening, changing or persistent symptoms should prompt an immediate call or follow up with their primary physician or return to the emergency department for reevaluation. Patient has expressed understanding.  Amount and/or Complexity of Data Reviewed Labs: ordered. Decision-making details documented in ED Course. Radiology: ordered. Decision-making details documented in ED Course.  Risk OTC drugs. Prescription drug management. Parenteral controlled substances. Decision regarding hospitalization. Diagnosis or treatment significantly limited by social determinants of health.     Procedures   No results found for this visit on 03/22/24 (from the past 4464 hours).   ED Results Results for orders placed or performed during the hospital encounter of 03/22/24  Urine, Pregnancy Qualitatve  Result Value Ref Range   Pregnancy Test, Urine Negative Negative  Comprehensive Metabolic Panel  Result Value Ref Range   Sodium 144 135 - 145 mmol/L   Potassium 3.5 3.5 - 5.0 mmol/L   Chloride 108 (H) 98 - 107 mmol/L   CO2 25.8 21.0 - 32.0 mmol/L   Anion Gap 10 3 - 11 mmol/L   BUN 11 8 - 20 mg/dL   Creatinine 9.46 (L) 9.39 - 1.10 mg/dL   BUN/Creatinine Ratio 21    eGFR CKD-EPI (2021) Female >90 >=60 mL/min/1.45m2   Glucose 121 70 - 179 mg/dL   Calcium 9.1 8.5 - 89.8 mg/dL   Albumin 4.1 3.5 - 5.0 g/dL   Total Protein 8.3 (H) 6.0 - 8.0  g/dL   Total Bilirubin 0.6 0.3 - 1.2 mg/dL   AST 14 (L) 15 - 40 U/L   ALT 17 12 - 78 U/L   Alkaline Phosphatase 81 46 - 116 U/L  Lipase  Result Value Ref Range   Lipase 28 16 - 77 U/L  Magnesium  Result Value Ref Range   Magnesium 1.9 1.6 - 2.6 mg/dL  Urinalysis with Microscopy with Culture Reflex  Result Value Ref Range   Color,  UA Yellow    Clarity, UA Cloudy (A) Clear   Specific Gravity, UA 1.029 (H) 1.010 - 1.025   pH, UA 6.0 5.0 - 8.0   Leukocyte Esterase, UA Trace (A) Negative   Nitrite, UA Negative Negative   Protein, UA Trace (A) Negative   Glucose, UA Negative Negative, Trace   Ketones, UA 10 mg/dL (A) Negative   Urobilinogen, UA <2.0 mg/dL <7.9 mg/dL   Bilirubin, UA Negative Negative   Blood, UA Large (A) Negative   RBC, UA 298 (H) 0 - 3 /HPF   WBC, UA 16 (H) 0 - 3 /HPF   Squam Epithel, UA 10 0 - 10 /HPF   Bacteria, UA None Seen None Seen /HPF   WBC Clumps None Seen None Seen /HPF   Hyphal Yeast None Seen None Seen /HPF   Yeast, UA None Seen None Seen /HPF   Mucus, UA Few (A) None Seen /HPF  CBC w/ Differential  Result Value Ref Range   WBC 15.0 (H) 4.0 - 10.5 10*9/L   RBC 5.32 (H) 3.80 - 5.10 10*12/L   HGB 11.4 (L) 11.5 - 15.0 g/dL   HCT 66.7 (L) 65.9 - 55.9 %   MCV 62.4 (L) 80.0 - 98.0 fL   MCH 21.4 (L) 27.0 - 34.0 pg   MCHC 34.3 32.0 - 36.0 g/dL   RDW 83.8 (H) 88.4 - 85.4 %   MPV     Platelet 211 140 - 415 10*9/L   Neutrophils % 73.2 %   Lymphocytes % 20.1 %   Monocytes % 4.1 %   Eosinophils % 1.6 %   Basophils % 0.3 %   Absolute Neutrophils 11.0 (H) 1.8 - 7.8 10*9/L   Absolute Lymphocytes 3.0 0.7 - 4.5 10*9/L   Absolute Monocytes 0.6 0.1 - 1.0 10*9/L   Absolute Eosinophils 0.2 0.0 - 0.4 10*9/L   Absolute Basophils 0.1 0.0 - 0.2 10*9/L   CT Abdomen Pelvis Wo Contrast Result Date: 03/22/2024 Exam: CT Renal Colic  History: 39 year old female with flank pain.  Technique: CT of the abdomen and pelvis without IV contrast. Renal colic protocol performed  supine. This examination is performed without oral or IV contrast in order to specifically assess the urinary tract for the presence of an obstructing ureteral calculus. Assessment of the bowel and of the solid organs of the abdomen and pelvis is compromised as a consequence. AEC (automated exposure control) and/or manual techniques such as size-specific kV and mAs are employed where appropriate to reduce radiation exposure for all CT exams.  Comparison: CT abdomen and pelvis without contrast on 10/11/2023  Findings: LOWER THORAX:  Clear lungs. No pleural effusion.  LIVER:  Normal size. Smooth contour. No focal lesion.  BILIARY: Cholecystectomy. No significant intra- or extrahepatic biliary ductal dilation.  PANCREAS:  Normal background parenchyma. No focal lesion. Main duct normal in caliber.  SPLEEN:  Normal size.  ADRENALS:  Normal morphology.  KIDNEYS/URETERS: Mild right hydroureter without definitive ureteral calculus. Calculus along the dependent aspect of the right bladder measuring 5 mm, may represent a recently passed calculus. Calculus in the proximal left ureter measuring 4 mm without significant hydronephrosis. Left kidney lower pole intrarenal calculi.  BOWEL:  Stomach normal. No evidence of enterocolitis. No obstruction.  Normal appendix.  VASCULAR:  Normal caliber. No significant atherosclerosis.  LYMPH NODES:  No lymphadenopathy.  PERITONEUM: No ascites.  PELVIC ORGANS: Bladder with calcification along the dependent right aspect measuring 5 mm, likely a recently passed calculus. Uterus is normal.  No adnexal mass.  BONES:  No aggressive lesion. No acute abnormality.  SOFT TISSUES: Small fat-containing umbilical hernia.    1.    Mild right hydroureter without ureteral calculus. Calculus along the dependent right aspect of the bladder measuring 5 mm, likely represents a recently passed calculus. 2.    Calculus in the proximal left ureter measuring 4 mm without significant hydronephrosis. 3.    Left  kidney lower pole intrarenal calculi.    Signed (Electronic Signature): 03/22/2024 6:04 PM Signed By: Nevil Ghodasara, MD   Medications Administered:  Medications  cefTRIAXone  (ROCEPHIN ) intramuscular injection 1 g (has no administration in time range)  lidocaine  (XYLOCAINE ) 10 mg/mL (1 %) injection 10 mL (has no administration in time range)  ketorolac  (TORADOL ) injection 30 mg (30 mg Intravenous Given 03/22/24 1716)  sodium chloride  0.9% (NS) bolus 1,000 mL (0 mL Intravenous Stopped 03/22/24 1821)    Discharge Medications (Medications Prescribed during this  ED visit and Patient's Home Medications) :    Your Medication List     START taking these medications    ciprofloxacin  HCl 500 MG tablet Commonly known as: CIPRO  Take 1 tablet (500 mg total) by mouth two (2) times a day for 10 days.   tamsulosin  0.4 mg capsule Commonly known as: FLOMAX  Take 1 capsule (0.4 mg total) by mouth daily for 10 days.       CHANGE how you take these medications    ibuprofen  800 MG tablet Commonly known as: MOTRIN  Take 1 tablet (800 mg total) by mouth every eight (8) hours as needed. What changed:  medication strength how much to take when to take this reasons to take this       ASK your doctor about these medications    DEPO-PROVERA  150 mg/mL injection Generic drug: medroxyPROGESTERone  Inject 1 mL (150 mg total) into the muscle.   oxyCODONE -acetaminophen  5-325 mg per tablet Commonly known as: PERCOCET Take 1 tablet by mouth every four (4) hours as needed for pain for up to 5 doses.   valACYclovir  500 MG tablet Commonly known as: VALTREX  Take 1 tablet (500 mg total) by mouth.          Rockey Mallick Lilly, [place] 03/22/24 1914  "
